# Patient Record
Sex: Female | Born: 1990 | Race: Black or African American | Hispanic: No | Smoking: Former smoker
Health system: Southern US, Community
[De-identification: ages and names within clinical notes are randomized; demographics above are authoritative.]

## PROBLEM LIST (undated history)

## (undated) DIAGNOSIS — Z789 Other specified health status: Secondary | ICD-10-CM

## (undated) DIAGNOSIS — R7303 Prediabetes: Secondary | ICD-10-CM

## (undated) DIAGNOSIS — A599 Trichomoniasis, unspecified: Secondary | ICD-10-CM

---
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
## 2010-01-24 ENCOUNTER — Emergency Department (HOSPITAL_COMMUNITY): Admission: EM | Admit: 2010-01-24 | Discharge: 2010-01-24 | Payer: Self-pay | Admitting: Family Medicine

## 2011-01-26 LAB — HCG, QUANTITATIVE, PREGNANCY: hCG, Beta Chain, Quant, S: <2

## 2011-01-26 LAB — CBC
MCHC: 34
MCV: 93.4
Platelets: 264

## 2011-04-01 ENCOUNTER — Encounter: Payer: Self-pay | Admitting: Emergency Medicine

## 2011-04-01 ENCOUNTER — Emergency Department (HOSPITAL_COMMUNITY): Payer: Self-pay

## 2011-04-01 ENCOUNTER — Emergency Department (HOSPITAL_COMMUNITY)
Admission: EM | Admit: 2011-04-01 | Discharge: 2011-04-01 | Disposition: A | Discharge status: Home / Self Care | Payer: Self-pay | Attending: Emergency Medicine | Admitting: Emergency Medicine

## 2011-04-01 DIAGNOSIS — M542 Cervicalgia: Secondary | ICD-10-CM | POA: Insufficient documentation

## 2011-04-01 DIAGNOSIS — M549 Dorsalgia, unspecified: Secondary | ICD-10-CM | POA: Insufficient documentation

## 2011-04-01 MED ORDER — IBUPROFEN 600 MG PO TABS: 600.0000 mg | ORAL_TABLET | Freq: Four times a day (QID) | ORAL | Status: AC | PRN

## 2011-04-01 MED ORDER — CYCLOBENZAPRINE HCL 10 MG PO TABS: 10.0000 mg | ORAL_TABLET | Freq: Two times a day (BID) | ORAL | Status: AC | PRN

## 2011-04-01 MED ORDER — ACETAMINOPHEN 325 MG PO TABS
650.0000 mg | ORAL_TABLET | Freq: Once | ORAL | Status: AC
  Administered 2011-04-01: 650 mg via ORAL
  Filled 2011-04-01 (×2): qty 1

## 2011-04-01 NOTE — ED Notes (Signed)
Pt denies pain at this time .  NAD, no verbal complaints
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
## 2011-06-05 ENCOUNTER — Encounter (HOSPITAL_COMMUNITY): Payer: Self-pay | Admitting: Emergency Medicine

## 2011-06-05 ENCOUNTER — Emergency Department (INDEPENDENT_AMBULATORY_CARE_PROVIDER_SITE_OTHER)
Admission: EM | Admit: 2011-06-05 | Discharge: 2011-06-05 | Disposition: A | Discharge status: Home / Self Care | Payer: Self-pay | Source: Home / Self Care | Attending: Family Medicine | Admitting: Family Medicine

## 2011-06-05 DIAGNOSIS — Z331 Pregnant state, incidental: Secondary | ICD-10-CM

## 2011-06-05 DIAGNOSIS — Z3201 Encounter for pregnancy test, result positive: Secondary | ICD-10-CM

## 2011-06-05 LAB — POCT URINALYSIS DIP (DEVICE)
Leukocytes, UA: NEGATIVE
Nitrite: NEGATIVE
Protein, ur: NEGATIVE mg/dL
pH: 8.5 — ABNORMAL HIGH (ref 5.0–8.0)

## 2011-06-05 NOTE — ED Notes (Signed)
Requested patient to put on gown and equipment at bedside

## 2011-06-05 NOTE — Discharge Instructions (Signed)
Prenatal Care  WHAT IS PRENATAL CARE?  Prenatal care means health care during your pregnancy, before your baby is born. Take care of yourself and your baby by:   Getting early prenatal care. If you know you are pregnant, or think you might be pregnant, call your caregiver as soon as possible. Schedule a visit for a general/prenatal examination.   Getting regular prenatal care. Follow your caregiver's schedule for blood and other necessary tests. Do not miss appointments.   Do everything you can to keep yourself and your baby healthy during your pregnancy.   Prenatal care should include evaluation of medical, dietary, educational, psychological, and social needs for the couple and the medical, surgical, and genetic history of the family of the mother and father.   Discuss with your caregiver:   Your medicines, prescription, over-the-counter, and herbal medicines.   Substance abuse, alcohol, smoking, and illegal drugs.   Domestic abuse and violence, if present.   Your immunizations.   Nutrition and diet.   Exercising.   Environment and occupational hazards, at home and at work.   History of sexually transmitted disease, both you and your partner.   Previous pregnancies.  WHY IS PRENATAL CARE SO IMPORTANT?  By seeing you regularly, your caregiver has the chance to find problems early, so that they can be treated as soon as possible. Other problems might be prevented. Many studies have shown that early and regular prenatal care is important for the health of both mothers and their babies.  I AM THINKING ABOUT GETTING PREGNANT. HOW CAN I TAKE CARE OF MYSELF?  Taking care of yourself before you get pregnant helps you to have a healthy pregnancy. It also lowers your chances of having a baby born with a birth defect. Here are ways to take care of yourself before you get pregnant:   Eat healthy foods, exercise regularly (30 minutes per day for most days of the week is best), and get enough  rest and sleep. Talk to your caregiver about what kinds of foods and exercises are best for you.   Take 400 micrograms (mcg) of folic acid (one of the B vitamins) every day. The best way to do this is to take a daily multivitamin pill that contains this amount of folic acid. Getting enough of the synthetic (manufactured) form of folic acid every day before you get pregnant and during early pregnancy can help prevent certain birth defects. Many breakfast cereals and other grain products have folic acid added to them, but only certain cereals contain 400 mcg of folic acid per serving. Check the label on your multivitamin or cereal to find the amount of folic acid in the food.   See your caregiver for a complete check up before getting pregnant. Make sure that you have had all your immunization shots, especially for rubella (German measles). Rubella can cause serious birth defects. Chickenpox is another illness you want to avoid during pregnancy. If you have had chickenpox and rubella in the past, you should be immune to them.   Tell your caregiver about any prescription or non-prescription medicines (including herbal remedies) you are taking. Some medicines are not safe to take during pregnancy.   Stop smoking cigarettes, drinking alcohol, or taking illegal drugs. Ask your caregiver for help, if you need it. You can also get help with alcohol and drugs by talking with a member of your faith community, a counselor, or a trusted friend.   Discuss and treat any medical, social, or psychological   problems before getting pregnant.   Discuss any history of genetic problems in the mother, father, and their families. Do genetic testing before getting pregnant, when possible.   Discuss any physical or emotional abuse with your caregiver.   Discuss with your caregiver if you might be exposed to harmful chemicals on your job or where you live.   Discuss with your caregiver if you think your job or the hours you  work may be harmful and should be changed.   The father should be involved with the decision making and with all aspects of the pregnancy, labor, and delivery.   If you have medical insurance, make sure you are covered for pregnancy.  I JUST FOUND OUT THAT I AM PREGNANT. HOW CAN I TAKE CARE OF MYSELF?  Here are ways to take care of yourself and the precious new life growing inside you:   Continue taking your multivitamin with 400 micrograms (mcg) of folic acid every day.   Get early and regular prenatal care. It does not matter if this is your first pregnancy or if you already have children. It is very important to see a caregiver during your pregnancy. Your caregiver will check at each visit to make sure that you and the baby are healthy. If there are any problems, action can be taken right away to help you and the baby.   Eat a healthy diet that includes:   Fruits.   Vegetables.   Foods low in saturated fat.   Grains.   Calcium-rich foods.   Drink 6 to 8 glasses of liquids a day.   Unless your caregiver tells you not to, try to be physically active for 30 minutes, most days of the week. If you are pressed for time, you can get your activity in through 10 minute segments, three times a day.   If you smoke, drink alcohol, or use drugs, STOP. These can cause long-term damage to your baby. Talk with your caregiver about steps to take to stop smoking. Talk with a member of your faith community, a counselor, a trusted friend, or your caregiver if you are concerned about your alcohol or drug use.   Ask your caregiver before taking any medicine, even over-the-counter medicines. Some medicines are not safe to take during pregnancy.   Get plenty of rest and sleep.   Avoid hot tubs and saunas during pregnancy.   Do not have X-rays taken, unless absolutely necessary and with the recommendation of your caregiver. A lead shield can be placed on your abdomen, to protect the baby when X-rays  are taken in other parts of the body.   Do not empty the cat litter when you are pregnant. It may contain a parasite that causes an infection called toxoplasmosis, which can cause birth defects. Also, use gloves when working in garden areas used by cats.   Do not eat uncooked or undercooked cheese, meats, or fish.   Stay away from toxic chemicals like:   Insecticides.   Solvents (some cleaners or paint thinners).   Lead.   Mercury.   Sexual relations may continue until the end of the pregnancy, unless you have a medical problem or there is a problem with the pregnancy and your caregiver tells you not to.   Do not wear high heel shoes, especially during the second half of the pregnancy. You can lose your balance and fall.   Do not take long trips, unless absolutely necessary. Be sure to see your caregiver before   going on the trip.   Do not sit in one position for more than 2 hours, when on a trip.   Take a copy of your medical records when going on a trip.   Know where there is a hospital in the city you are visiting, in case of an emergency.   Most dangerous household products will have pregnancy warnings on their labels. Ask your caregiver about products if you are unsure.   Limit or eliminate your caffeine intake from coffee, tea, sodas, medicines, and chocolate.   Many women continue working through pregnancy. Staying active might help you stay healthier. If you have a question about the safety or the hours you work at your particular job, talk with your caregiver.   Get informed:   Read books.   Watch videos.   Go to childbirth classes for you and the father.   Talk with experienced moms.   Ask your caregiver about childbirth education classes for you and your partner. Classes can help you and your partner prepare for the birth of your baby.   Ask about a pediatrician (baby doctor) and methods and pain medicine for labor, delivery, and possible Cesarean delivery  (C-section).  I AM NOT THINKING ABOUT GETTING PREGNANT RIGHT NOW, BUT HEARD THAT ALL WOMEN SHOULD TAKE FOLIC ACID EVERY DAY?  All women of childbearing age, with even a remote chance of getting pregnant, should try to make sure they get enough folic acid. Many pregnancies are not planned. Many women do not know they are actually pregnant early in their pregnancies, and certain birth defects happen in the very early part of pregnancy. Taking 400 micrograms (mcg) of folic acid every day will help prevent certain birth defects that happen in the early part of pregnancy. If a woman begins taking vitamin pills in the second or third month of pregnancy, it may be too late to prevent birth defects. Folic acid may also have other health benefits for women, besides preventing birth defects.  HOW OFTEN SHOULD I SEE MY CAREGIVER DURING PREGNANCY?  Your caregiver will give you a schedule for your prenatal visits. You will have visits more often as you get closer to the end of your pregnancy. An average pregnancy lasts about 40 weeks.  A typical schedule includes visiting your caregiver:   About once each month, during your first 6 months of pregnancy.   Every 2 weeks, during the next 2 months.   Weekly in the last month, until the delivery date.  Your caregiver will probably want to see you more often if:  You are over 35.   Your pregnancy is high risk, because you have certain health problems or problems with the pregnancy, such as:   Diabetes.   High blood pressure.   The baby is not growing on schedule, according to the dates of the pregnancy.  Your caregiver will do special tests, to make sure you and the baby are not having any serious problems. WHAT HAPPENS DURING PRENATAL VISITS?   At your first prenatal visit, your caregiver will talk to you about you and your partner's health history and your family's health history, and will do a physical exam.   On your first visit, a physical exam will  include checks of your blood pressure, height and weight, and an exam of your pelvic organs. Your caregiver will do a Pap test if you have not had one recently, and will do cultures of your cervix to make sure there is no   infection.   At each visit, there will be tests of your blood, urine, blood pressure, weight, and checking the progress of the baby.   Your caregiver will be able to tell you when to expect that your baby will be born.   Each visit is also a chance for you to learn about staying healthy during pregnancy and for asking questions.   Discuss whether you will be breastfeeding.   At your later prenatal visits, your caregiver will check how you are doing and how the baby is developing. You may have a number of tests done as your pregnancy progresses.   Ultrasound exams are often used to check on the baby's growth and health.   You may have more urine and blood tests, as well as special tests, if needed. These may include amniocentesis (examine fluid in the pregnancy sac), stress tests (check how baby responds to contractions), biophysical profile (measures fetus well-being). Your caregiver will explain the tests and why they are necessary.  I AM IN MY LATE THIRTIES, AND I WANT TO HAVE A CHILD NOW. SHOULD I DO ANYTHING SPECIAL?  As you get older, there is more chance of having a medical problem (high blood pressure), pregnancy problem (preeclampsia, problems with the placenta), miscarriage, or a baby born with a birth defect. However, most women in their late thirties and early forties have healthy babies. See your caregiver on a regular basis before you get pregnant and be sure to go for exams throughout your pregnancy. Your caregiver probably will want to do some special tests to check on you and your baby's health when you are pregnant.  Women today are often delaying having children until later in life, when they are in their thirties and forties. While many women in their thirties  and forties have no difficulty getting pregnant, fertility does decline with age. For women over 40 who cannot get pregnant after 6 months of trying, it is recommended that they see their caregiver for a fertility evaluation. It is not uncommon to have trouble becoming pregnant or experience infertility (inability to become pregnant after trying for one year). If you think that you or your partner may be infertile, you can discuss this with your caregiver. He or she can recommend treatments such as drugs, surgery, or assisted reproductive technology.  Document Released: 04/05/2003 Document Revised: 12/13/2010 Document Reviewed: 03/02/2009 [employer] Patient Information 2012 [employer]. 

## 2011-06-05 NOTE — ED Provider Notes (Signed)
Medical screening examination/treatment/procedure(s) were performed by non-physician practitioner and as supervising physician I was immediately available for consultation/collaboration.  Corrie Mckusick, MD 06/05/11 1731

## 2011-06-05 NOTE — ED Provider Notes (Signed)
History     CSN: [id number]  Arrival date & time 06/05/11  1443   None     Chief Complaint  Patient presents with  . Vaginal Bleeding    (Consider location/radiation/quality/duration/timing/severity/associated sxs/prior treatment) Patient is a 21 y.o. female presenting with vaginal bleeding. The history is provided by the patient. No language interpreter was used.  Vaginal Bleeding This is a new problem. The current episode started yesterday. The problem has not changed since onset.Pertinent negatives include no abdominal pain. The symptoms are aggravated by nothing. The symptoms are relieved by nothing. The treatment provided no relief.  Pt reports she had intercourse last pm and saw blood afterwards.  Pt reports she is pregnant.  Pt reports she had a miscarriage with last pregnancy.  No prenatal care.  Patient reports she has not had any vaginal discharge she denies any risk of any sexually transmitted. Patient has not established any prenatal care she is taking prenatal vitamins. Patient reports she saw a small amount of blood after having sex she has not had it seen any blood today patient denies any abdominal pain. She has not had any cramping  History reviewed. No pertinent past medical history.  History reviewed. No pertinent past surgical history.  No family history on file.  History  Substance Use Topics  . Smoking status: Never Smoker   . Smokeless tobacco: Not on file  . Alcohol Use: No    OB History    Grav Para Term Preterm Abortions TAB SAB Ect Mult Living                  Review of Systems  Gastrointestinal: Negative for abdominal pain.  Genitourinary: Positive for vaginal bleeding.  All other systems reviewed and are negative.    Allergies  Review of patient's allergies indicates no known allergies.  Home Medications   Current Outpatient Rx  Name Route Sig Dispense Refill  . PRENATAL VITAMINS PO Oral Take by mouth.      BP 124/62  Pulse 102   Temp(Src) 98.6 F (37 C) (Oral)  Resp 16  SpO2 100%  LMP 03/30/2011  Physical Exam  Vitals reviewed. Constitutional: She appears well-developed and well-nourished.  HENT:  Head: Normocephalic.  Eyes: Pupils are equal, round, and reactive to light.  Neck: Normal range of motion.  Pulmonary/Chest: Effort normal.  Abdominal: Soft.  Genitourinary: Vagina normal. No vaginal discharge found.       Normal vaginal exam no discharge no blood bilateral adnexa are nontender cervix nontender  Musculoskeletal: Normal range of motion.  Neurological: She is alert.  Skin: Skin is warm and dry.  Psychiatric: She has a normal mood and affect.    ED Course  Procedures (including critical care time)  Labs Reviewed  POCT URINALYSIS DIP (DEVICE) - Abnormal; Notable for the following:    Ketones, ur TRACE (*)    pH 8.5 (*)    All other components within normal limits  POCT PREGNANCY, URINE - Abnormal; Notable for the following:    Preg Test, Ur POSITIVE (*)    All other components within normal limits   No results found.   No diagnosis found.    MDM  Advised patient she does not currently have any vaginal bleeding if she is [facility]. Patient is advised to avoid intercourse no tampon use for the next week. She is advised to establish prenatal care       Langston Masker, [place] 06/05/11 1645

## 2011-06-05 NOTE — ED Notes (Signed)
Started vaginal spotting last night.  Brownish discharge with a little blood after intercourse last night and noticed with wiping.  Denies pain, denies spotting today.  Patient did not have transportation when symptoms occurred

## 2011-06-23 ENCOUNTER — Inpatient Hospital Stay (HOSPITAL_COMMUNITY)
Admission: AD | Admit: 2011-06-23 | Discharge: 2011-06-25 | DRG: 777 | Disposition: A | Discharge status: Home / Self Care | Payer: Medicaid Other | Source: Ambulatory Visit | Attending: Obstetrics & Gynecology | Admitting: Obstetrics & Gynecology

## 2011-06-23 ENCOUNTER — Inpatient Hospital Stay (HOSPITAL_COMMUNITY): Payer: Medicaid Other | Admitting: Anesthesiology

## 2011-06-23 ENCOUNTER — Inpatient Hospital Stay (HOSPITAL_COMMUNITY): Payer: Medicaid Other

## 2011-06-23 ENCOUNTER — Encounter (HOSPITAL_COMMUNITY)
Admission: AD | Disposition: A | Discharge status: Home / Self Care | Payer: Self-pay | Source: Ambulatory Visit | Attending: Obstetrics & Gynecology

## 2011-06-23 ENCOUNTER — Encounter (HOSPITAL_COMMUNITY): Payer: Self-pay | Admitting: Anesthesiology

## 2011-06-23 ENCOUNTER — Encounter (HOSPITAL_COMMUNITY): Payer: Self-pay | Admitting: *Deleted

## 2011-06-23 ENCOUNTER — Encounter (HOSPITAL_COMMUNITY): Payer: Self-pay | Admitting: Obstetrics & Gynecology

## 2011-06-23 DIAGNOSIS — A499 Bacterial infection, unspecified: Secondary | ICD-10-CM | POA: Diagnosis present

## 2011-06-23 DIAGNOSIS — Z5331 Laparoscopic surgical procedure converted to open procedure: Secondary | ICD-10-CM

## 2011-06-23 DIAGNOSIS — O00109 Unspecified tubal pregnancy without intrauterine pregnancy: Principal | ICD-10-CM | POA: Diagnosis present

## 2011-06-23 DIAGNOSIS — O008 Other ectopic pregnancy without intrauterine pregnancy: Secondary | ICD-10-CM | POA: Insufficient documentation

## 2011-06-23 DIAGNOSIS — N760 Acute vaginitis: Secondary | ICD-10-CM | POA: Diagnosis present

## 2011-06-23 DIAGNOSIS — B9689 Other specified bacterial agents as the cause of diseases classified elsewhere: Secondary | ICD-10-CM | POA: Diagnosis present

## 2011-06-23 HISTORY — DX: Trichomoniasis, unspecified: A59.9

## 2011-06-23 HISTORY — PX: LAPAROTOMY: SHX154

## 2011-06-23 HISTORY — PX: LAPAROSCOPY: SHX197

## 2011-06-23 LAB — CBC
HCT: 39.5 % (ref 36.0–46.0)
MCH: 31.4 pg (ref 26.0–34.0)
MCHC: 33.2 g/dL (ref 30.0–36.0)
MCV: 94.7 fL (ref 78.0–100.0)
RDW: 12.8 % (ref 11.5–15.5)

## 2011-06-23 LAB — URINE MICROSCOPIC-ADD ON

## 2011-06-23 LAB — URINALYSIS, ROUTINE W REFLEX MICROSCOPIC
Bilirubin Urine: NEGATIVE
Nitrite: NEGATIVE
Protein, ur: NEGATIVE mg/dL
Specific Gravity, Urine: 1.025 (ref 1.005–1.030)
Urobilinogen, UA: 0.2 mg/dL (ref 0.0–1.0)

## 2011-06-23 LAB — HCG, QUANTITATIVE, PREGNANCY: hCG, Beta Chain, Quant, S: 4463 m[IU]/mL — ABNORMAL HIGH (ref <5)

## 2011-06-23 LAB — WET PREP, GENITAL

## 2011-06-23 SURGERY — LAPAROSCOPY OPERATIVE
Anesthesia: General | Site: Abdomen | Laterality: Left

## 2011-06-23 MED ORDER — GLYCOPYRROLATE 0.2 MG/ML IJ SOLN
INTRAMUSCULAR | Status: DC | PRN
  Administered 2011-06-23: .6 mg via INTRAVENOUS

## 2011-06-23 MED ORDER — CITRIC ACID-SODIUM CITRATE 334-500 MG/5ML PO SOLN
30.0000 mL | Freq: Once | ORAL | Status: AC
  Administered 2011-06-23: 30 mL via ORAL
  Filled 2011-06-23: qty 15

## 2011-06-23 MED ORDER — BUPIVACAINE HCL (PF) 0.25 % IJ SOLN
INTRAMUSCULAR | Status: AC
  Filled 2011-06-23: qty 30

## 2011-06-23 MED ORDER — MENTHOL 3 MG MT LOZG: 1.0000 | LOZENGE | OROMUCOSAL | Status: DC | PRN

## 2011-06-23 MED ORDER — CEFAZOLIN SODIUM 1-5 GM-% IV SOLN
INTRAVENOUS | Status: DC | PRN
  Administered 2011-06-23: 1 g via INTRAVENOUS

## 2011-06-23 MED ORDER — SUCCINYLCHOLINE CHLORIDE 20 MG/ML IJ SOLN
INTRAMUSCULAR | Status: DC | PRN
  Administered 2011-06-23: 140 mg via INTRAVENOUS

## 2011-06-23 MED ORDER — MIDAZOLAM HCL 2 MG/2ML IJ SOLN
INTRAMUSCULAR | Status: AC
  Filled 2011-06-23: qty 2

## 2011-06-23 MED ORDER — MIDAZOLAM HCL 5 MG/5ML IJ SOLN
INTRAMUSCULAR | Status: DC | PRN
  Administered 2011-06-23: 2 mg via INTRAVENOUS

## 2011-06-23 MED ORDER — OXYCODONE-ACETAMINOPHEN 5-325 MG PO TABS
1.0000 | ORAL_TABLET | ORAL | Status: DC | PRN
  Administered 2011-06-24 – 2011-06-25 (×5): 2 via ORAL
  Filled 2011-06-23 (×5): qty 2

## 2011-06-23 MED ORDER — ONDANSETRON HCL 4 MG PO TABS: 4.0000 mg | ORAL_TABLET | Freq: Four times a day (QID) | ORAL | Status: DC | PRN

## 2011-06-23 MED ORDER — PANTOPRAZOLE SODIUM 40 MG PO TBEC
40.0000 mg | DELAYED_RELEASE_TABLET | Freq: Every day | ORAL | Status: DC
  Administered 2011-06-24: 40 mg via ORAL
  Filled 2011-06-23 (×2): qty 1

## 2011-06-23 MED ORDER — FENTANYL CITRATE 0.05 MG/ML IJ SOLN
INTRAMUSCULAR | Status: AC
  Filled 2011-06-23: qty 5

## 2011-06-23 MED ORDER — NEOSTIGMINE METHYLSULFATE 1 MG/ML IJ SOLN
INTRAMUSCULAR | Status: DC | PRN
  Administered 2011-06-23: 3 mg via INTRAVENOUS

## 2011-06-23 MED ORDER — GLYCOPYRROLATE 0.2 MG/ML IJ SOLN
INTRAMUSCULAR | Status: AC
  Filled 2011-06-23: qty 1

## 2011-06-23 MED ORDER — VASOPRESSIN 20 UNIT/ML IJ SOLN
INTRAVENOUS | Status: DC | PRN
  Administered 2011-06-23: 18:00:00 via INTRAMUSCULAR

## 2011-06-23 MED ORDER — ROCURONIUM BROMIDE 50 MG/5ML IV SOLN
INTRAVENOUS | Status: AC
  Filled 2011-06-23: qty 1

## 2011-06-23 MED ORDER — ONDANSETRON HCL 4 MG/2ML IJ SOLN: 4.0000 mg | Freq: Four times a day (QID) | INTRAMUSCULAR | Status: DC | PRN

## 2011-06-23 MED ORDER — VASOPRESSIN 20 UNIT/ML IJ SOLN
INTRAMUSCULAR | Status: AC
  Filled 2011-06-23: qty 1

## 2011-06-23 MED ORDER — LACTATED RINGERS IV BOLUS (SEPSIS)
250.0000 mL | Freq: Once | INTRAVENOUS | Status: AC
  Administered 2011-06-23: 1000 mL via INTRAVENOUS

## 2011-06-23 MED ORDER — SUCCINYLCHOLINE CHLORIDE 20 MG/ML IJ SOLN
INTRAMUSCULAR | Status: AC
  Filled 2011-06-23: qty 10

## 2011-06-23 MED ORDER — PHENYLEPHRINE HCL 10 MG/ML IJ SOLN
INTRAMUSCULAR | Status: DC | PRN
  Administered 2011-06-23: .8 mg via INTRAVENOUS
  Administered 2011-06-23: .12 mg via INTRAVENOUS
  Administered 2011-06-23: .08 mg via INTRAVENOUS

## 2011-06-23 MED ORDER — IBUPROFEN 600 MG PO TABS: 600.0000 mg | ORAL_TABLET | Freq: Four times a day (QID) | ORAL | Status: DC | PRN

## 2011-06-23 MED ORDER — FENTANYL CITRATE 0.05 MG/ML IJ SOLN
INTRAMUSCULAR | Status: DC | PRN
  Administered 2011-06-23 (×3): 50 ug via INTRAVENOUS
  Administered 2011-06-23: 150 ug via INTRAVENOUS
  Administered 2011-06-23 (×3): 50 ug via INTRAVENOUS

## 2011-06-23 MED ORDER — ONDANSETRON HCL 4 MG/2ML IJ SOLN
INTRAMUSCULAR | Status: DC | PRN
  Administered 2011-06-23: 4 mg via INTRAVENOUS

## 2011-06-23 MED ORDER — LIDOCAINE HCL (CARDIAC) 20 MG/ML IV SOLN
INTRAVENOUS | Status: AC
  Filled 2011-06-23: qty 5

## 2011-06-23 MED ORDER — BUPIVACAINE HCL (PF) 0.25 % IJ SOLN
INTRAMUSCULAR | Status: DC | PRN
  Administered 2011-06-23: 1 mL

## 2011-06-23 MED ORDER — ONDANSETRON HCL 4 MG/2ML IJ SOLN
INTRAMUSCULAR | Status: AC
  Filled 2011-06-23: qty 2

## 2011-06-23 MED ORDER — FENTANYL CITRATE 0.05 MG/ML IJ SOLN
INTRAMUSCULAR | Status: AC
  Filled 2011-06-23: qty 2

## 2011-06-23 MED ORDER — FAMOTIDINE IN NACL 20-0.9 MG/50ML-% IV SOLN
20.0000 mg | Freq: Once | INTRAVENOUS | Status: AC
  Administered 2011-06-23: 20 mg via INTRAVENOUS
  Filled 2011-06-23: qty 50

## 2011-06-23 MED ORDER — CEFAZOLIN SODIUM 1-5 GM-% IV SOLN
INTRAVENOUS | Status: AC
  Filled 2011-06-23: qty 50

## 2011-06-23 MED ORDER — SIMETHICONE 80 MG PO CHEW: 80.0000 mg | CHEWABLE_TABLET | Freq: Four times a day (QID) | ORAL | Status: DC | PRN

## 2011-06-23 MED ORDER — DEXAMETHASONE SODIUM PHOSPHATE 4 MG/ML IJ SOLN
INTRAMUSCULAR | Status: DC | PRN
  Administered 2011-06-23: 10 mg via INTRAVENOUS

## 2011-06-23 MED ORDER — DOCUSATE SODIUM 100 MG PO CAPS
100.0000 mg | ORAL_CAPSULE | Freq: Two times a day (BID) | ORAL | Status: DC
  Administered 2011-06-23 – 2011-06-24 (×3): 100 mg via ORAL
  Filled 2011-06-23 (×3): qty 1

## 2011-06-23 MED ORDER — DEXAMETHASONE SODIUM PHOSPHATE 10 MG/ML IJ SOLN
INTRAMUSCULAR | Status: AC
  Filled 2011-06-23: qty 1

## 2011-06-23 MED ORDER — LIDOCAINE HCL (CARDIAC) 20 MG/ML IV SOLN
INTRAVENOUS | Status: DC | PRN
  Administered 2011-06-23: 20 mg via INTRAVENOUS

## 2011-06-23 MED ORDER — ZOLPIDEM TARTRATE 5 MG PO TABS: 5.0000 mg | ORAL_TABLET | Freq: Every evening | ORAL | Status: DC | PRN

## 2011-06-23 MED ORDER — HYDROMORPHONE HCL PF 1 MG/ML IJ SOLN
0.2000 mg | INTRAMUSCULAR | Status: DC | PRN
  Administered 2011-06-23: 0.6 mg via INTRAVENOUS
  Filled 2011-06-23 (×2): qty 1

## 2011-06-23 MED ORDER — PROPOFOL 10 MG/ML IV EMUL
INTRAVENOUS | Status: DC | PRN
  Administered 2011-06-23: 200 mg via INTRAVENOUS

## 2011-06-23 MED ORDER — PROPOFOL 10 MG/ML IV EMUL
INTRAVENOUS | Status: AC
  Filled 2011-06-23: qty 20

## 2011-06-23 MED ORDER — LACTATED RINGERS IV SOLN
INTRAVENOUS | Status: DC
  Administered 2011-06-23: 1000 mL via INTRAVENOUS

## 2011-06-23 MED ORDER — ROCURONIUM BROMIDE 100 MG/10ML IV SOLN
INTRAVENOUS | Status: DC | PRN
  Administered 2011-06-23: 2.5 mg via INTRAVENOUS
  Administered 2011-06-23: 30 mg via INTRAVENOUS
  Administered 2011-06-23: 10 mg via INTRAVENOUS

## 2011-06-23 MED ORDER — FENTANYL CITRATE 0.05 MG/ML IJ SOLN
25.0000 ug | INTRAMUSCULAR | Status: DC | PRN
  Administered 2011-06-23 (×4): 50 ug via INTRAVENOUS

## 2011-06-23 MED ORDER — NEOSTIGMINE METHYLSULFATE 1 MG/ML IJ SOLN
INTRAMUSCULAR | Status: AC
  Filled 2011-06-23: qty 10

## 2011-06-23 MED ORDER — LACTATED RINGERS IV SOLN
INTRAVENOUS | Status: DC | PRN
  Administered 2011-06-23 (×3): via INTRAVENOUS

## 2011-06-23 SURGICAL SUPPLY — 45 items
BLADE SURG 10 STRL SS (BLADE) ×2 IMPLANT
CABLE HIGH FREQUENCY MONO STRZ (ELECTRODE) IMPLANT
CHLORAPREP W/TINT 26ML (MISCELLANEOUS) ×2 IMPLANT
CLOTH BEACON ORANGE TIMEOUT ST (SAFETY) ×2 IMPLANT
DRSG COVADERM 4X10 (GAUZE/BANDAGES/DRESSINGS) ×2 IMPLANT
DRSG COVADERM 4X8 (GAUZE/BANDAGES/DRESSINGS) ×2 IMPLANT
DRSG COVADERM PLUS 2X2 (GAUZE/BANDAGES/DRESSINGS) ×2 IMPLANT
ELECT REM PT RETURN 9FT ADLT (ELECTROSURGICAL) ×2
ELECTRODE REM PT RTRN 9FT ADLT (ELECTROSURGICAL) ×1 IMPLANT
GLOVE BIO SURGEON STRL SZ7 (GLOVE) ×4 IMPLANT
GLOVE BIOGEL PI IND STRL 7.0 (GLOVE) ×2 IMPLANT
GLOVE BIOGEL PI INDICATOR 7.0 (GLOVE) ×2
GLOVE SURG SS PI 7.5 STRL IVOR (GLOVE) ×4 IMPLANT
GOWN PREVENTION PLUS LG XLONG (DISPOSABLE) ×4 IMPLANT
HEMOSTAT SURGICEL 2X3 (HEMOSTASIS) ×2 IMPLANT
NEEDLE GYRUS 33CM (NEEDLE) ×2 IMPLANT
NEEDLE INSUFFLATION 14GA 120MM (NEEDLE) ×4 IMPLANT
NS IRRIG 1000ML POUR BTL (IV SOLUTION) ×2 IMPLANT
PACK LAPAROSCOPY BASIN (CUSTOM PROCEDURE TRAY) ×2 IMPLANT
PENCIL BUTTON HOLSTER BLD 10FT (ELECTRODE) ×2 IMPLANT
POUCH SPECIMEN RETRIEVAL 10MM (ENDOMECHANICALS) ×2 IMPLANT
PROTECTOR NERVE ULNAR (MISCELLANEOUS) ×2 IMPLANT
SEALER TISSUE G2 CVD JAW 35 (ENDOMECHANICALS) IMPLANT
SEALER TISSUE G2 CVD JAW 45CM (ENDOMECHANICALS) IMPLANT
SET IRRIG TUBING LAPAROSCOPIC (IRRIGATION / IRRIGATOR) ×2 IMPLANT
SPONGE LAP 18X18 X RAY DECT (DISPOSABLE) ×8 IMPLANT
SUT VIC AB 0 CT1 18XCR BRD8 (SUTURE) ×1 IMPLANT
SUT VIC AB 0 CT1 27 (SUTURE) ×1
SUT VIC AB 0 CT1 27XBRD ANBCTR (SUTURE) ×1 IMPLANT
SUT VIC AB 0 CT1 8-18 (SUTURE) ×1
SUT VIC AB 2-0 SH 27 (SUTURE) ×3
SUT VIC AB 2-0 SH 27XBRD (SUTURE) ×3 IMPLANT
SUT VIC AB 3-0 X1 27 (SUTURE) IMPLANT
SUT VIC AB 4-0 KS 27 (SUTURE) ×2 IMPLANT
SUT VICRYL 0 UR6 27IN ABS (SUTURE) ×4 IMPLANT
SUT VICRYL 4-0 PS2 18IN ABS (SUTURE) ×2 IMPLANT
SYR 50ML LL SCALE MARK (SYRINGE) ×2 IMPLANT
TOWEL OR 17X24 6PK STRL BLUE (TOWEL DISPOSABLE) ×6 IMPLANT
TRAY FOLEY CATH 14FR (SET/KITS/TRAYS/PACK) ×2 IMPLANT
TROCAR Z-THREAD BLADED 11X100M (TROCAR) ×2 IMPLANT
TROCAR Z-THREAD BLADED 5X100MM (TROCAR) ×2 IMPLANT
TUBING NON-CON 1/4 X 20 CONN (TUBING) ×2 IMPLANT
WARMER LAPAROSCOPE (MISCELLANEOUS) ×2 IMPLANT
WATER STERILE IRR 1000ML POUR (IV SOLUTION) IMPLANT
YANKAUER SUCT BULB TIP NO VENT (SUCTIONS) ×2 IMPLANT

## 2011-06-23 NOTE — Op Note (Signed)
Brittany Quinn PROCEDURE DATE: 06/23/2011  PREOPERATIVE DIAGNOSIS: Left ectopic pregnancy at [redacted] weeks GA, nonviable fetus seen on ultrasound POSTOPERATIVE DIAGNOSIS: Ruptured left cornual ectopic pregnancy PROCEDURE: Diagnostic laparoscopy converted to exploratory laparotomy, left salpingectomy, left cornual wedge resection and removal of ectopic pregnancy SURGEON:  Dr. Jaynie Collins  INDICATIONS: 21 y.o. G2P0010 here for with diagnosed left ectopic pregnancy. On ultrasound, there was also possibility of a cornual ectopic. Given these ultrasound findings, patient was counseled regarding the need in surgery. Laparoscopic removal of the left fallopian tube, possible laparotomy was recommended. Patient was told that in the event of a cornual ectopic, a wedge resection will need to be done.Risks of surgery were discussed with the patient including but not limited to: bleeding which may require transfusion or reoperation; infection which may require antibiotics; injury to bowel, bladder, ureters or other surrounding organs; need for additional procedures; thromboembolic phenomenon, incisional problems and other postoperative/anesthesia complications. Written informed consent was obtained.    FINDINGS:  Ruptured left cornual ectopic pregnancy, with 300 mL of hemoperitoneum. Normal left ovary, right ovary, right fallopian tube and rest of the uterus. No other source of bleeding noted.  ANESTHESIA:    General INTRAVENOUS FLUIDS: 2600  mL ESTIMATED BLOOD LOSS: 500 ml (300 mL hemoperitoneum, 200 mL blood loss during surgery) URINE OUTPUT: 150 ml SPECIMENS: Left cornual ectopic pregnancy COMPLICATIONS: None immediate  PROCEDURE IN DETAIL:  The patient received intravenous antibiotics and had sequential compression devices applied to her lower extremities.  She was then taken to the operating room where general anesthesia was administered and was found to be adequate.  She was placed in the dorsal lithotomy  position, and was prepped and draped in a sterile manner.  A Foley catheter was inserted into her bladder and attached to constant drainage. After an adequate timeout was performed, attention was turned to her abdomen where an umbilical incision was made. The Veress needle was then introduced into the abdomen, and the abdomen was insufflated with carbon dioxide gas. The 10 mm umbilical trocar was then placed, and the laparoscope was placed through this port. The abdomen and pelvis were surveyed, and the findings were as noted above. The decision was made to convert from laparoscopy to exploratory laparotomy. The trocar was removed from the patient's umbilicus. The abdomen was desufflated, and the incision was reapproximated with 4-0 Vicryl.  Attention was then turned to her lower abdomen where a Pfannenstiel incision was made. This was taken down to the layer of fascia, the fascia was incised in the midline and then incision was extended bilaterally. Cokers were applied to the superior aspect of the fascial incision and the underlying rectus muscles were dissected up bluntly and sharply. A similar process was carried out on the inferior aspect. The muscles were separated in the midline bluntly, and the peritoneum was entered bluntly. The hemoperitoneum was noted immediately and was suctioned off. Attention was then turned to the patient's uterus, where there ruptured left cornual ectopic was noted with significant ongoing blood loss. The utero-ovarian pedicle was then clamped with Heaney clamps, cut and doubly suture ligated. The rest of the fallopian tube on the left side was also clamped in the mesosalpinx area, cut and suture ligated allowing for left salpingectomy. The base of the corneal ectopic was infiltrated with a diluted vasopressin solution, and clamps were placed underneath the cornual ectopic. The cornual area containing the ectopic was then resected from the side of the uterus.  The incision was  reapproximated using 0  Vicryl interrupted sutures, then imbricated with 2-0 Vicryl. 2-0 Vicryl was also used in figure-of-eight stitches to help with hemostasis. Surgicel was then placed on the incision. The rest of the pelvis were surveyed and no other site of bleeding was noted. The pelvis was irrigated, and hemostasis was reconfirmed.  The muscles were then reapproximated with interrupted 2-0 Vicryl sutures.  The fascia was reapproximated using 0 Vicryl in a running stitch, and the skin was reapproximated using a 4-0 Vicryl subcuticular stitch. The patient tolerated the procedure well. Sponge, instruments, and needle counts were correct x 2.  She was taken to the recovery room in stable condition.

## 2011-06-23 NOTE — ED Provider Notes (Signed)
History   Brittany Quinn is a 21 y.o. year old G40P0010 female at 12.[redacted] weeks gestation by LMP who presents to [facility] reporting spotting. She has been seen once before in the is pregnancy for spotting at [facility] and did not follow-up.    CSN: [id number]  Arrival date & time 06/23/11  1238   None     Chief Complaint  Patient presents with  . Vaginal Bleeding    (Consider location/radiation/quality/duration/timing/severity/associated sxs/prior treatment) HPI  Past Medical History  Diagnosis Date  . Trichomonas     Past Surgical History  Procedure Date  . No past surgeries     History reviewed. No pertinent family history.  History  Substance Use Topics  . Smoking status: Never Smoker   . Smokeless tobacco: Never Used  . Alcohol Use: No    OB History    Grav Para Term Preterm Abortions TAB SAB Ect Mult Living   2    1  1          Review of Systems  Constitutional: Negative for fever and chills.  Gastrointestinal: Negative for nausea, vomiting, abdominal pain, diarrhea, constipation and abdominal distention.  Genitourinary: Positive for vaginal bleeding.  Neurological: Negative for syncope and weakness.    Allergies  Review of patient's allergies indicates no known allergies.  Home Medications  No current outpatient prescriptions on file.  BP 137/77  Pulse 102  Temp(Src) 99.2 F (37.3 C) (Oral)  Resp 20  Ht 5\' 3"  (1.6 m)  Wt 78.926 kg (174 lb)  BMI 30.82 kg/m2  LMP 03/30/2011  Physical Exam  Constitutional: She is oriented to person, place, and time. She appears well-developed and well-nourished.  Cardiovascular: Regular rhythm.  Tachycardia present.   Pulmonary/Chest: Effort normal and breath sounds normal.  Abdominal: Soft. There is no tenderness.  Genitourinary: Uterus is not enlarged and not tender. Cervix exhibits no motion tenderness, no discharge and no friability. Right adnexum displays no mass, no tenderness and no fullness. Left adnexum  displays no mass, no tenderness and no fullness. There is bleeding (small-moderate amount of BRB) around the vagina.  Musculoskeletal: Normal range of motion.  Neurological: She is alert and oriented to person, place, and time.  Skin: Skin is warm and dry. No pallor.  Psychiatric: She has a normal mood and affect.    ED Course  Procedures (including critical care time)  Results for orders placed during the hospital encounter of 06/23/11 (from the past 24 hour(s))  WET PREP, GENITAL     Status: Abnormal   Collection Time   06/23/11  1:30 PM      Component Value Range   Yeast Wet Prep HPF POC NONE SEEN  NONE SEEN    Trich, Wet Prep NONE SEEN  NONE SEEN    Clue Cells Wet Prep HPF POC MODERATE (*) NONE SEEN    WBC, Wet Prep HPF POC FEW (*) NONE SEEN   HCG, QUANTITATIVE, PREGNANCY     Status: Abnormal   Collection Time   06/23/11  1:44 PM      Component Value Range   hCG, Beta Chain, Quant, S 4463 (*) <5 (mIU/mL)  CBC     Status: Normal   Collection Time   06/23/11  1:44 PM      Component Value Range   WBC 7.7  4.0 - 10.5 (K/uL)   RBC 4.17  3.87 - 5.11 (MIL/uL)   Hemoglobin 13.1  12.0 - 15.0 (g/dL)   HCT 14.7  82.9 -  46.0 (%)   MCV 94.7  78.0 - 100.0 (fL)   MCH 31.4  26.0 - 34.0 (pg)   MCHC 33.2  30.0 - 36.0 (g/dL)   RDW 25.3  66.4 - 40.3 (%)   Platelets 233  150 - 400 (K/uL)  URINALYSIS, ROUTINE W REFLEX MICROSCOPIC     Status: Abnormal   Collection Time   06/23/11  2:10 PM      Component Value Range   Color, Urine YELLOW  YELLOW    APPearance CLEAR  CLEAR    Specific Gravity, Urine 1.025  1.005 - 1.030    pH 6.0  5.0 - 8.0    Glucose, UA NEGATIVE  NEGATIVE (mg/dL)   Hgb urine dipstick LARGE (*) NEGATIVE    Bilirubin Urine NEGATIVE  NEGATIVE    Ketones, ur NEGATIVE  NEGATIVE (mg/dL)   Protein, ur NEGATIVE  NEGATIVE (mg/dL)   Urobilinogen, UA 0.2  0.0 - 1.0 (mg/dL)   Nitrite NEGATIVE  NEGATIVE    Leukocytes, UA NEGATIVE  NEGATIVE   URINE MICROSCOPIC-ADD ON     Status: Abnormal    Collection Time   06/23/11  2:10 PM      Component Value Range   Squamous Epithelial / LPF FEW (*) RARE    RBC / HPF 3-6  <3 (RBC/hpf)   Blood Type B pos   US Ob Comp Less 14 Wks  06/23/2011  *RADIOLOGY REPORT*  Clinical Data: 21 year old pregnant female with bleeding and spotting.  OBSTETRIC <14 WK [place] AND TRANSVAGINAL OB US  Technique:  Both transabdominal and transvaginal ultrasound examinations were performed for complete evaluation of the gestation as well as the maternal uterus, adnexal regions, and pelvic cul-de-sac.  Transvaginal technique was performed to assess early pregnancy.  Comparison:  None  Intrauterine gestational sac:  Not visualized  A 5.6 x 4.3 x 5.7 cm left periuterine adnexal mass is identified with a fetal pole. Crown rump length measures 12.3 mm which would correspond to a gestational age of [redacted] weeks 4 days.  There is no evidence of fetal cardiac activity. It is difficult to determine the location of this ectopic pregnancy but an interstitial/cornual ectopic pregnancy is not excluded.  The ovaries bilaterally are unremarkable. A trace amount of free fluid within the pelvis is noted.  IMPRESSION: Left periuterine ectopic pregnancy with fetal pole.  It is difficult to determine if this ectopic pregnancy lies in a cornual versus isthmic location.  Critical Value/emergent results were called by telephone at the time of interpretation on 06/23/2011  at 03:22 p.m.  to  [place], midwife, who verbally acknowledged these results.  Original Report Authenticated By: Rosendo Gros, M.D.     MDM  Left ectopic pregnancy Prep for OR Dr. Macon Large at [facility] discussing R/B/I for surgery  Support given  Dorathy Kinsman 06/23/2011 3:58 PM

## 2011-06-23 NOTE — Preoperative (Signed)
Beta Blockers   Reason not to administer Beta Blockers:Not Applicable 

## 2011-06-23 NOTE — Transfer of Care (Signed)
Immediate Anesthesia Transfer of Care Note  Patient: Brittany Quinn  Procedure(s) Performed: Procedure(s) (LRB): LAPAROSCOPY OPERATIVE (Left) LAPAROTOMY (Left)  Patient Location: PACU  Anesthesia Type: General  Level of Consciousness: awake, alert , oriented and sedated  Airway & Oxygen Therapy: Patient Spontanous Breathing and Patient connected to nasal cannula oxygen  Post-op Assessment: Report given to PACU RN and Post -op Vital signs reviewed and stable  Post vital signs: stable  Complications: No apparent anesthesia complications

## 2011-06-23 NOTE — H&P (Signed)
Preoperative History and Physical  Brittany Quinn is a 21 y.o. G2P0010 here for surgical management of 7 week size ectopic pregnancy seen on ultrasound.  Patient came in for evaluation of bleeding in early pregnancy, no PNC yet.  Denies any abdominal pain or other symptoms.   Proposed surgery: Laparoscopic salpingectomy and removal of ectopic pregnancy, possible laparotomy  PMH:    Past Medical History  Diagnosis Date  . Trichomonas    PSH:     Past Surgical History  Procedure Date  . No past surgeries    POb/GynH:   OB History    Grav Para Term Preterm Abortions TAB SAB Ect Mult Living   2    1  1        Patient denies any cervical dysplasia or other STIs apart from Trichomonas.  Medications: Current facility-administered medications:citric acid-sodium citrate (ORACIT) solution 30 mL, 30 mL, Oral, Once, Jiles Garter, MD;  famotidine (PEPCID) IVPB 20 mg, 20 mg, Intravenous, Once, Jiles Garter, MD;  lactated ringers bolus 250 mL, 250 mL, Intravenous, Once, Tereso Newcomer, MD, 1,000 mL at 06/23/11 1616  Allergies: No Known Allergies  SH:   History  Substance Use Topics  . Smoking status: Never Smoker   . Smokeless tobacco: Never Used  . Alcohol Use: No   FH:   History reviewed. No pertinent family history.  Review of Systems: Noncontributory  PHYSICAL EXAM: Blood pressure 137/77, pulse 102, temperature 99.2 F (37.3 C), temperature source Oral, resp. rate 20, height 5\' 3"  (1.6 m), weight 78.926 kg (174 lb), last menstrual period 03/30/2011. General appearance - alert, well appearing, and in no distress Chest - clear to auscultation, no wheezes, rales or rhonchi, symmetric air entry Heart - normal rate and regular rhythm Abdomen - soft, nontender, nondistended, no masses or organomegaly Pelvic - examination not indicated Ext - No c/c/e, NT  Labs: Recent Results (from the past 336 hour(s))  WET PREP, GENITAL   Collection Time   06/23/11  1:30 PM   Component Value Range   Yeast Wet Prep HPF POC NONE SEEN  NONE SEEN    Trich, Wet Prep NONE SEEN  NONE SEEN    Clue Cells Wet Prep HPF POC MODERATE (*) NONE SEEN    WBC, Wet Prep HPF POC FEW (*) NONE SEEN   HCG, QUANTITATIVE, PREGNANCY   Collection Time   06/23/11  1:44 PM      Component Value Range   hCG, Beta Chain, Quant, S 4463 (*) <5 (mIU/mL)  CBC   Collection Time   06/23/11  1:44 PM      Component Value Range   WBC 7.7  4.0 - 10.5 (K/uL)   RBC 4.17  3.87 - 5.11 (MIL/uL)   Hemoglobin 13.1  12.0 - 15.0 (g/dL)   HCT 16.1  09.6 - 04.5 (%)   MCV 94.7  78.0 - 100.0 (fL)   MCH 31.4  26.0 - 34.0 (pg)   MCHC 33.2  30.0 - 36.0 (g/dL)   RDW 40.9  81.1 - 91.4 (%)   Platelets 233  150 - 400 (K/uL)  URINALYSIS, ROUTINE W REFLEX MICROSCOPIC   Collection Time   06/23/11  2:10 PM      Component Value Range   Color, Urine YELLOW  YELLOW    APPearance CLEAR  CLEAR    Specific Gravity, Urine 1.025  1.005 - 1.030    pH 6.0  5.0 - 8.0    Glucose, UA NEGATIVE  NEGATIVE (mg/dL)  Hgb urine dipstick LARGE (*) NEGATIVE    Bilirubin Urine NEGATIVE  NEGATIVE    Ketones, ur NEGATIVE  NEGATIVE (mg/dL)   Protein, ur NEGATIVE  NEGATIVE (mg/dL)   Urobilinogen, UA 0.2  0.0 - 1.0 (mg/dL)   Nitrite NEGATIVE  NEGATIVE    Leukocytes, UA NEGATIVE  NEGATIVE   URINE MICROSCOPIC-ADD ON   Collection Time   06/23/11  2:10 PM      Component Value Range   Squamous Epithelial / LPF FEW (*) RARE    RBC / HPF 3-6  <3 (RBC/hpf)   Imaging Studies: 06/23/2011  OBSTETRIC <14 WK [place] AND TRANSVAGINAL OB US  Technique:  Both transabdominal and transvaginal ultrasound examinations were performed for complete evaluation of the gestation as well as the maternal uterus, adnexal regions, and pelvic cul-de-sac.  Transvaginal technique was performed to assess early pregnancy.  Comparison:  None  Intrauterine gestational sac:  Not visualized  A 5.6 x 4.3 x 5.7 cm left periuterine adnexal mass is identified with a fetal pole. Crown rump  length measures 12.3 mm which would correspond to a gestational age of [redacted] weeks 4 days.  There is no evidence of fetal cardiac activity. It is difficult to determine the location of this ectopic pregnancy but an interstitial/cornual ectopic pregnancy is not excluded.  The ovaries bilaterally are unremarkable. A trace amount of free fluid within the pelvis is noted.  IMPRESSION: Left periuterine ectopic pregnancy with fetal pole.  It is difficult to determine if this ectopic pregnancy lies in a cornual versus isthmic location.  Critical Value/emergent results were called by telephone at the time of interpretation on 06/23/2011  at 03:22 p.m.  to  [place], midwife, who verbally acknowledged these results.  Original Report Authenticated By: Rosendo Gros, M.D.   Assessment and Plan: 21 y.o. G2P0010 with 7 week size left ectopic pregnancy, On exam, she had stable vital signs, and no peritoneal signs .B+, Hgb 13.3. Patient was counseled regarding need for laparoscopic salpingectomy, possible laparotomy especially in the event of a cornual ectopic. Risks of surgery including bleeding which may require transfusion or reoperation, infection, injury to bowel or other surrounding organs, need for additional procedures including laparotomy were explained to patient and written informed consent was obtained. Patient has been NPO since 2230 on 06/22/11 and she will remain NPO for procedure. Anesthesia and OR aware. To OR when ready.   Jaynie Collins, M.D. 06/23/2011 4:23 PM

## 2011-06-23 NOTE — Progress Notes (Signed)
Spotting intermittently since last night, when wiping.

## 2011-06-23 NOTE — ED Provider Notes (Signed)
Attestation of Attending Supervision of Advanced Practitioner: Evaluation and management procedures were performed by the PA/NP/CNM/OB Fellow under my supervision/collaboration. Chart reviewed, and agree with management and plan.  21 y.o. G2P0010 with 7 week size left ectopic pregnancy, see ultrasound report below.     06/23/2011   OBSTETRIC <14 WK [place] AND TRANSVAGINAL OB US  Technique:  Both transabdominal and transvaginal ultrasound examinations were performed for complete evaluation of the gestation as well as the maternal uterus, adnexal regions, and pelvic cul-de-sac.  Transvaginal technique was performed to assess early pregnancy.  Comparison:  None  Intrauterine gestational sac:  Not visualized  A 5.6 x 4.3 x 5.7 cm left periuterine adnexal mass is identified with a fetal pole. Crown rump length measures 12.3 mm which would correspond to a gestational age of [redacted] weeks 4 days.  There is no evidence of fetal cardiac activity. It is difficult to determine the location of this ectopic pregnancy but an interstitial/cornual ectopic pregnancy is not excluded.  The ovaries bilaterally are unremarkable. A trace amount of free fluid within the pelvis is noted.  IMPRESSION: Left periuterine ectopic pregnancy with fetal pole.  It is difficult to determine if this ectopic pregnancy lies in a cornual versus isthmic location.  Critical Value/emergent results were called by telephone at the time of interpretation on 06/23/2011  at 03:22 p.m.  to  [place], midwife, who verbally acknowledged these results.  Original Report Authenticated By: Rosendo Gros, M.D.   On exam, she had stable vital signs, and no peritoneal signs  .B+, Hgb 13.3.  Patient was counseled regarding need for laparoscopic salpingectomy, possible laparotomy especially in the event of a cornual ectopic. Risks of surgery including bleeding which may require transfusion or reoperation, infection, injury to bowel or other surrounding organs, need for  additional procedures including laparoscopy or laparotomy were explained to patient and written informed consent was obtained.  Patient has been NPO since 2230 on 06/22/11 and she will remain NPO for procedure. Anesthesia and OR aware.  To OR when ready.  Jaynie Collins, M.D. 06/23/2011 4:15 PM

## 2011-06-23 NOTE — Anesthesia Preprocedure Evaluation (Addendum)

## 2011-06-23 NOTE — Anesthesia Postprocedure Evaluation (Signed)
  Anesthesia Post-op Note  Patient: Brittany Quinn  Procedure(s) Performed: Procedure(s) (LRB): LAPAROSCOPY OPERATIVE (Left) LAPAROTOMY (Left)  Patient is awake and responsive. Pain and nausea are reasonably well controlled. Vital signs are stable and clinically acceptable. Oxygen saturation is clinically acceptable. There are no apparent anesthetic complications at this time. Patient is ready for discharge.

## 2011-06-24 LAB — CBC
HCT: 31.4 % — ABNORMAL LOW (ref 36.0–46.0)
Hemoglobin: 10.5 g/dL — ABNORMAL LOW (ref 12.0–15.0)
MCV: 94 fL (ref 78.0–100.0)
Platelets: 200 10*3/uL (ref 150–400)
RBC: 3.34 MIL/uL — ABNORMAL LOW (ref 3.87–5.11)
WBC: 12.6 10*3/uL — ABNORMAL HIGH (ref 4.0–10.5)

## 2011-06-24 MED ORDER — METRONIDAZOLE 500 MG PO TABS
500.0000 mg | ORAL_TABLET | Freq: Two times a day (BID) | ORAL | Status: DC
  Administered 2011-06-24 (×2): 500 mg via ORAL
  Filled 2011-06-24 (×3): qty 1

## 2011-06-24 NOTE — Progress Notes (Signed)
1 Day Post-Op Procedure(s) (LRB): LAPAROSCOPY DIAGNOSTIC (Left) LAPAROTOMY (Left) WEDGE RESECTION OF CORNUAL ECTOPIC (Left)  Subjective: Patient reports incisional pain and tolerating PO.  Not voided yet after foley removal about an hour ago.  No flatus yet.  Objective: I have reviewed patient's vital signs, intake and output, medications and labs.  General: alert and no distress Resp: clear to auscultation bilaterally Cardio: regular rate and rhythm GI: soft, non-tender; bowel sounds normal; no masses,  no organomegaly and incision: clean, dry, intact and minimal bloody drainage present Extremities: extremities normal, atraumatic, no cyanosis or edema and Homans sign is negative, no sign of DVT Vaginal Bleeding: minimal  Results for orders placed during the hospital encounter of 06/23/11 (from the past 24 hour(s))  WET PREP, GENITAL     Status: Abnormal   Collection Time   06/23/11  1:30 PM      Component Value Range   Yeast Wet Prep HPF POC NONE SEEN  NONE SEEN    Trich, Wet Prep NONE SEEN  NONE SEEN    Clue Cells Wet Prep HPF POC MODERATE (*) NONE SEEN    WBC, Wet Prep HPF POC FEW (*) NONE SEEN   HCG, QUANTITATIVE, PREGNANCY     Status: Abnormal   Collection Time   06/23/11  1:44 PM      Component Value Range   hCG, Beta Chain, Quant, S 4463 (*) <5 (mIU/mL)  CBC     Status: Normal   Collection Time   06/23/11  1:44 PM      Component Value Range   WBC 7.7  4.0 - 10.5 (K/uL)   RBC 4.17  3.87 - 5.11 (MIL/uL)   Hemoglobin 13.1  12.0 - 15.0 (g/dL)   HCT 16.1  09.6 - 04.5 (%)   MCV 94.7  78.0 - 100.0 (fL)   MCH 31.4  26.0 - 34.0 (pg)   MCHC 33.2  30.0 - 36.0 (g/dL)   RDW 40.9  81.1 - 91.4 (%)   Platelets 233  150 - 400 (K/uL)  URINALYSIS, ROUTINE W REFLEX MICROSCOPIC     Status: Abnormal   Collection Time   06/23/11  2:10 PM      Component Value Range   Color, Urine YELLOW  YELLOW    APPearance CLEAR  CLEAR    Specific Gravity, Urine 1.025  1.005 - 1.030    pH 6.0  5.0 - 8.0     Glucose, UA NEGATIVE  NEGATIVE (mg/dL)   Hgb urine dipstick LARGE (*) NEGATIVE    Bilirubin Urine NEGATIVE  NEGATIVE    Ketones, ur NEGATIVE  NEGATIVE (mg/dL)   Protein, ur NEGATIVE  NEGATIVE (mg/dL)   Urobilinogen, UA 0.2  0.0 - 1.0 (mg/dL)   Nitrite NEGATIVE  NEGATIVE    Leukocytes, UA NEGATIVE  NEGATIVE   URINE MICROSCOPIC-ADD ON     Status: Abnormal   Collection Time   06/23/11  2:10 PM      Component Value Range   Squamous Epithelial / LPF FEW (*) RARE    RBC / HPF 3-6  <3 (RBC/hpf)  CBC     Status: Abnormal   Collection Time   06/24/11  5:13 AM      Component Value Range   WBC 12.6 (*) 4.0 - 10.5 (K/uL)   RBC 3.34 (*) 3.87 - 5.11 (MIL/uL)   Hemoglobin 10.5 (*) 12.0 - 15.0 (g/dL)   HCT 78.2 (*) 95.6 - 46.0 (%)   MCV 94.0  78.0 - 100.0 (fL)  MCH 31.4  26.0 - 34.0 (pg)   MCHC 33.4  30.0 - 36.0 (g/dL)   RDW 16.1  09.6 - 04.5 (%)   Platelets 200  150 - 400 (K/uL)    Assessment: s/p Procedure(s): LAPAROSCOPY DIAGNOSTIC (Left) LAPAROTOMY (Left) WEDGE RESECTION OF CORNUAL ECTOPIC (Left): stable, progressing well and tolerating diet Bacterial vaginosis  Plan: Advance diet Encourage ambulation Advance to PO medication If stable, discharge tomorrow Metronidazole for BV as noted on wet prep Patient will need early cesarean section for any subsequent pregnancies, this was discussed with patient in detail.   LOS: 1 day    Vanita Cannell A 06/24/2011, 9:18 AM

## 2011-06-24 NOTE — Anesthesia Postprocedure Evaluation (Signed)
  Anesthesia Post-op Note  Patient: Brittany Quinn  Procedure(s) Performed: Procedure(s) (LRB): LAPAROSCOPY OPERATIVE (Left) LAPAROTOMY (Left)  Patient Location: 318  Anesthesia Type: General  Level of Consciousness: awake, alert  and oriented  Airway and Oxygen Therapy: Patient Spontanous Breathing  Post-op Pain: none  Post-op Assessment: Post-op Vital signs reviewed and Patient's Cardiovascular Status Stable  Post-op Vital Signs: Reviewed and stable  Complications: No apparent anesthesia complications

## 2011-06-24 NOTE — Progress Notes (Signed)
Pt. Given "Comfort Packet Ectopic Pregnancy" given to pt.

## 2011-06-24 NOTE — Progress Notes (Signed)
1 Day Post-Op Procedure(s) (LRB): LAPAROSCOPY OPERATIVE (Left) LAPAROTOMY (Left) With left salpingectomy and Wedge resection for cornual ectopic. Subjective: Patient reports incisional pain, tolerating PO and no problems voiding.  Pain controlled with p.o meds.   Objective: I have reviewed patient's vital signs, intake and output and labs. CBC    Component Value Date/Time   WBC 12.6* 06/24/2011 0513   RBC 3.34* 06/24/2011 0513   HGB 10.5* 06/24/2011 0513   HCT 31.4* 06/24/2011 0513   PLT 200 06/24/2011 0513   MCV 94.0 06/24/2011 0513   MCH 31.4 06/24/2011 0513   MCHC 33.4 06/24/2011 0513   RDW 12.6 06/24/2011 0513    General: alert, no distress and nonverbal, but denies any concerns or complaints GI: soft, non-tender; bowel sounds normal; no masses,  no organomegaly and incision: clean and intact, with dressing dry.  Extremities: Homans sign is negative, no sign of DVT  Assessment: s/p Procedure(s) (LRB): LAPAROSCOPY OPERATIVE (Left) LAPAROTOMY (Left): stable, progressing well, tolerating diet and anemia consistent with good postop course  Plan: Encourage ambulation Discontinue IV fluids shower, anticipate d/c in AM  LOS: 1 day    [place] Jaquith V 06/24/2011, 10:56 AM

## 2011-06-24 NOTE — Addendum Note (Signed)
Addendum  created 06/24/11 0946 by Graciela Husbands, CRNA   Modules edited:Notes Section

## 2011-06-25 ENCOUNTER — Encounter (HOSPITAL_COMMUNITY): Payer: Self-pay | Admitting: Obstetrics and Gynecology

## 2011-06-25 LAB — GC/CHLAMYDIA PROBE AMP, GENITAL: Chlamydia, DNA Probe: NEGATIVE

## 2011-06-25 MED ORDER — OXYCODONE-ACETAMINOPHEN 5-325 MG PO TABS: 1.0000 | ORAL_TABLET | ORAL | Status: AC | PRN

## 2011-06-25 NOTE — Discharge Instructions (Addendum)
Ectopic Pregnancy An ectopic pregnancy is when the fertilized egg attaches (implants) outside the uterus. Most ectopic pregnancies occur in the fallopian tube. Rarely do ectopic pregnancies occur on the ovary, intestine, pelvis, or cervix. An ectopic pregnancy does not have the ability to develop into a normal, healthy baby.  A ruptured ectopic pregnancy is one in which the fallopian tube gets torn or bursts and results in internal bleeding. Often there is intense abdominal pain, and sometimes, vaginal bleeding. Having an ectopic pregnancy can be a life-threatening experience. If left untreated, this dangerous condition can lead to a blood transfusion, abdominal surgery, or even death. CAUSES  Damage to the fallopian tubes is the suspected cause in most ectopic pregnancies.  RISK FACTORS Depending on your circumstances, the amount of risk of having an ectopic pregnancy will vary. There are 3 categories that may help you identify whether you are potentially at risk. High Risk  You have gone through infertility treatment.   You have had a previous ectopic pregnancy.   You have had previous tubal surgery.   You have had previous surgery to have the fallopian tubes tied (tubal ligation).   You have tubal problems or diseases.   You have been exposed to DES. DES is a medicine that was used until 1971 and had effects on babies whose mothers took the medicine.   You become pregnant while using an intrauterine device (IUD) for birth control.  Moderate Risk  You have a history of infertility.   You have a history of a sexually transmitted infection (STI).   You have a history of pelvic inflammatory disease (PID).   You have scarring from endometriosis.   You have multiple sexual partners.   You smoke.  Low Risk  You have had previous pelvic surgery.   You use vaginal douching.   You became sexually active before 22 years of age.  SYMPTOMS  An ectopic pregnancy should be suspected  in anyone who has missed a period and has abdominal pain or bleeding.  You may experience normal pregnancy symptoms, such as:   Nausea.   Tiredness.   Breast tenderness.   Symptoms that are not normal include:   Pain with intercourse.   Irregular vaginal bleeding or spotting.   Cramping or pain on one side, or in the lower abdomen.   Fast heartbeat.   Passing out while having a bowel movement.   Symptoms of a ruptured ectopic pregnancy and internal bleeding may include:   Sudden, severe pain in the abdomen and pelvis.   Dizziness or fainting.   Pain in the shoulder area.  DIAGNOSIS  Tests that may be performed include:  A pregnancy test.   An ultrasound.   Testing the specific level of pregnancy hormone in the bloodstream.   Taking a sample of uterus tissue (dilation and curettage, D&C).   Surgery to perform a visual exam of the inside of the abdomen using a lighted tube (laparoscopy).  TREATMENT  An injection of methotrexate medicine may be given. This is given if:  The diagnosis is made early.   The fallopian tube has not ruptured.   You are considered to be a good candidate for the medicine.  Usually, pregnancy hormone blood levels are checked after methotrexate treatment. This is to be sure the medicine is effective. It may take 4 to 6 weeks for the pregnancy to be absorbed (though most pregnancies will be absorbed by 3 weeks). Surgical treatment may be needed. A lighted tube (laparoscope) is   used to remove the tubal pregnancy. If severe internal bleeding occurs, a cut (incision) may be made in the lower abdomen (laparotomy), and the ectopic pregnancy is removed. This stops the bleeding. Part of the fallopian tube, or the whole tube, may be removed as well (salpingectomy). After surgery, pregnancy hormone tests may be done to be sure there is no pregnancy tissue left. You may receive a RhoGAM shot if you are Rh negative and the father is Rh positive, or if you do  not know the Rh type of the father. This is to prevent problems with any future pregnancy. SEEK IMMEDIATE MEDICAL CARE IF:  You have any symptoms of an ectopic pregnancy. This is a medical emergency. Document Released: 05/10/2004 Document Revised: 03/22/2011 Document Reviewed: 12/07/2010 [facility] Patient Information 2012 [place], [place].Ruptured Ectopic Pregnancy An ectopic pregnancy is when the fertilized egg attaches (implants) outside the uterus. Most ectopic pregnancies occur in the fallopian tube. Rarely do ectopic pregnancies occur on the ovary, intestine, pelvis, or cervix. An ectopic pregnancy does not have the ability to develop into a normal, healthy baby.  A ruptured ectopic pregnancy is one in which the fallopian tube gets torn or bursts and results in internal bleeding. Often there is intense abdominal pain, and sometimes, vaginal bleeding. Having an ectopic pregnancy can be a life-threatening experience. If left untreated, this dangerous condition can lead to a blood transfusion, abdominal surgery, or even death.  CAUSES  Damage to the fallopian tubes is the suspected cause in most ectopic pregnancies.  RISK FACTORS Depending on your circumstances, the amount of risk of having an ectopic pregnancy will vary. There are 3 categories that may help you identify whether you are potentially at risk. High Risk  You have gone through infertility treatment.   You have had a previous ectopic pregnancy.   You have had previous tubal surgery.   You have had previous surgery to have the fallopian tubes tied (tubal ligation).   You have tubal problems or diseases.   You have been exposed to DES. DES is a medicine that was used until 1971 and had effects on babies whose mothers took the medicine.   You become pregnant while using an intrauterine device (IUD) for birth control.  Moderate Risk  You have a history of infertility.   You have a history of a sexually transmitted infection  (STI).   You have a history of pelvic inflammatory disease (PID).   You have scarring from endometriosis.   You have multiple sexual partners.   You smoke.  Low Risk  You have had previous pelvic surgery.   You use vaginal douching.   You became sexually active before 22 years of age.  SYMPTOMS An ectopic pregnancy should be suspected in anyone who has missed a period and has abdominal pain or bleeding.  You may experience normal pregnancy symptoms, such as:   Nausea.   Tiredness.   Breast tenderness.   Symptoms that are not normal include:   Pain with intercourse.   Irregular vaginal bleeding or spotting.   Cramping or pain on one side, or in the lower abdomen.   Fast heartbeat.   Passing out while having a bowel movement.   Symptoms of a ruptured ectopic pregnancy and internal bleeding may include:   Sudden, severe pain in the abdomen and pelvis.   Dizziness or fainting.   Pain in the shoulder area.  DIAGNOSIS  Tests that may be performed include:  A pregnancy test.   An ultrasound.  Testing the specific level of pregnancy hormone in the bloodstream.   Taking a sample of uterus tissue (dilation and curettage, D&C).   Surgery to perform a visual exam of the inside of the abdomen using a lighted tube (laparoscopy).  TREATMENT  Laparoscopic surgery or abdominal surgery is recommended for a ruptured ectopic pregnancy.   The whole fallopian tube may need to be removed (salpingectomy).   If the tube is not too damaged, the tube may be saved, and the pregnancy will be surgically removed. Intime, the tube may still function.   If you have lost a lot of blood, you may need a blood transfusion.   You may receive a RhoGAM shot if you are Rh negative and the father is Rh positive, or if you do not know the Rh type of the father. This is to prevent problems with any future pregnancy.  SEEK IMMEDIATE MEDICAL CARE IF:  You have any symptoms of an ectopic or  ruptured ectopic pregnancy. This is a medical emergency. Document Released: 03/30/2000 Document Revised: 03/22/2011 Document Reviewed: 12/07/2010 [facility] Patient Information 2012 [place], [place].

## 2011-06-25 NOTE — Progress Notes (Signed)
UR chart review completed.  

## 2011-06-25 NOTE — Progress Notes (Signed)
2 Days Post-Op Procedure(s) (LRB): LAPAROSCOPY OPERATIVE (Left) LAPAROTOMY (Left) left cornual resection of ectopic  Subjective: Patient reports tolerating PO, + flatus, + BM and no problems voiding.    Objective: I have reviewed patient's vital signs, intake and output and medications.  General: alert and no distress GI: soft, non-tender; bowel sounds normal; no masses,  no organomegaly and incision: clean, dry and intact Extremities: extremities normal, atraumatic, no cyanosis or edema  Assessment: s/p Procedure(s) (LRB): LAPAROSCOPY OPERATIVE (Left) LAPAROTOMY (Left): stable, progressing well, tolerating diet and stable for discharge  Plan: Discharge home  LOS: 2 days    Lafonda Patron V 06/25/2011, 7:37 AM

## 2011-06-25 NOTE — Progress Notes (Signed)
D/C instructions reviewed with pt. And s.o.  Verbally states understanding of home care.  No home equipment needed.  Ambulated to car with staff without incident.  D/c'd home with family.

## 2011-06-25 NOTE — Discharge Summary (Signed)
Physician Discharge Summary  Patient ID: Brittany Quinn MRN: [id number] DOB/AGE: Feb 04, 1991 21 y.o.  Admit date: 06/23/2011 Discharge date: 06/25/2011  Admission Diagnoses:Left ruptured ectopic preganancy  Discharge Diagnoses: Left cornual ruptured ectopic pregnancy Active Problems:  * No active hospital problems. *    Discharged Condition: good  Hospital Course: Admitted with ultrasound diagnosis of large ectopic, possible cornual ectopic, went to laparoscopy, converted to laparotomy, and cornual wedge resection of left tube and cornu performed. CBC    Component Value Date/Time   WBC 12.6* 06/24/2011 0513   RBC 3.34* 06/24/2011 0513   HGB 10.5* 06/24/2011 0513   HCT 31.4* 06/24/2011 0513   PLT 200 06/24/2011 0513   MCV 94.0 06/24/2011 0513   MCH 31.4 06/24/2011 0513   MCHC 33.4 06/24/2011 0513   RDW 12.6 06/24/2011 0513   No transfusions required   Consults: None  Significant Diagnostic Studies: labs:  Hemoglobin & Hematocrit     Component Value Date/Time   HGB 10.5* 06/24/2011 0513   HCT 31.4* 06/24/2011 0513   Blood type B positive  Treatments: surgery: left cornual wedge resection of ectopic  Discharge Exam: Blood pressure 115/64, pulse 99, temperature 98.4 F (36.9 C), temperature source Oral, resp. rate 18, height 5\' 3"  (1.6 m), weight 78.926 kg (174 lb), last menstrual period 03/30/2011, SpO2 99.00%, unknown if currently breastfeeding. General appearance: alert, cooperative and no distress Resp: clear to auscultation bilaterally Chest wall: no tenderness Cardio: regular rate and rhythm, S1, S2 normal, no murmur, click, rub or gallop GI: soft, non-tender; bowel sounds normal; no masses,  no organomegaly  Disposition: 01-Home or Self Care  Discharge Orders    Future Orders Please Complete By Expires   Diet - low sodium heart healthy      Increase activity slowly      Discharge instructions      Comments:   General Gynecological Post-Operative Instructions You may  expect to feel dizzy, weak, and drowsy for as long as 24 hours after receiving the medicine that made you sleep (anesthetic). The following information pertains to your recovery period for the first 24 hours following surgery.  Do not drive a car, ride a bicycle, participate in physical activities, or take public transportation until you are done taking narcotic pain medicines or as directed by your caregiver.  Do not drink alcohol or take tranquilizers.  Do not take medicine that has not been prescribed by your caregiver.  Do not sign important papers or make important decisions while on narcotic pain medicines.  Have a responsible person with you.  CARE OF INCISION  Keep incision clean and dry. Take showers instead of baths until your caregiver gives you permission to take baths. Check with your caregiver if you have tubes coming from the wound site.  Avoid heavy lifting (more than 10 pounds/4.5 kilograms), pushing, or pulling.  Avoid activities that may risk injury to your surgical site.  Only take over-the-counter or prescription medicines for pain, discomfort, or fever as directed by your caregiver. Do not take aspirin. It can make you bleed. Take medicines (antibiotics) that kill germs as directed.  Call the office or go to the MAU if:  You feel sick to your stomach (nauseous).  You start to throw up (vomit).  You have trouble eating or drinking.  You have an oral temperature above 100.4.  You have constipation that is not helped by adjusting diet or increasing fluid intake. Pain medicines are a common cause of constipation.  SEEK IMMEDIATE MEDICAL  CARE IF:  You have persistent dizziness.  You have difficulty breathing or a congested sounding (croupy) cough.  You have an oral temperature above 102.5, not controlled by medicine.  There is increasing pain or tenderness near or in the surgical site.  [employer] Patient Information 2011 [place], [place].   Sexual Activity Restrictions       Comments:   No sex x 4 weeks, make appointment for postop visit before resuming sex   No wound care      Call MD for:  temperature >100.4      Call MD for:  severe uncontrolled pain        Medication List  As of 06/25/2011  7:48 AM   TAKE these medications         oxyCODONE-acetaminophen 5-325 MG per tablet   Commonly known as: PERCOCET   Take 1 tablet by mouth every 4 (four) hours as needed (moderate to severe pain (when tolerating fluids)).      prenatal multivitamin Tabs   Take 1 tablet by mouth at bedtime.           Follow-up Information    Follow up in 4 weeks. (or earlier as needed if symptoms worsen)          SignedTilda Burrow 06/25/2011, 7:48 AM

## 2011-06-26 ENCOUNTER — Encounter: Payer: Self-pay | Admitting: Obstetrics & Gynecology

## 2011-06-29 ENCOUNTER — Other Ambulatory Visit: Payer: Self-pay

## 2011-06-29 DIAGNOSIS — O008 Other ectopic pregnancy without intrauterine pregnancy: Secondary | ICD-10-CM

## 2011-07-19 ENCOUNTER — Ambulatory Visit (INDEPENDENT_AMBULATORY_CARE_PROVIDER_SITE_OTHER): Payer: Medicaid Other | Admitting: Family Medicine

## 2011-07-19 VITALS — BP 122/71 | HR 80 | Temp 98.6°F | Ht 63.0 in | Wt 173.5 lb

## 2011-07-19 DIAGNOSIS — Z09 Encounter for follow-up examination after completed treatment for conditions other than malignant neoplasm: Secondary | ICD-10-CM

## 2011-07-19 NOTE — Progress Notes (Signed)
History Doing well, post-op.  Desires Nexplanon  Physical exam Filed Vitals:   07/19/11 0930  BP: 122/71  Pulse: 80  Temp: 98.6 F (37 C)   Incision is well healed.   Assessment S/p laparotomy and cornual excision for ectopic.  Plan For Nexplanon.

## 2011-07-19 NOTE — Patient Instructions (Signed)
Contraception Choices Contraception (birth control) is the use of any methods or devices to prevent pregnancy. Below are some methods to help avoid pregnancy. HORMONAL METHODS   Contraceptive implant. This is a thin, plastic tube containing progesterone hormone. It does not contain estrogen hormone. Your caregiver inserts the tube in the inner part of the upper arm. The tube can remain in place for up to 3 years. After 3 years, the implant must be removed. The implant prevents the ovaries from releasing an egg (ovulation), thickens the cervical mucus which prevents sperm from entering the uterus, and thins the lining of the inside of the uterus.   Progesterone-only injections. These injections are given every 3 months by your caregiver to prevent pregnancy. This synthetic progesterone hormone stops the ovaries from releasing eggs. It also thickens cervical mucus and changes the uterine lining. This makes it harder for sperm to survive in the uterus.   Birth control pills. These pills contain estrogen and progesterone hormone. They work by stopping the egg from forming in the ovary (ovulation). Birth control pills are prescribed by a caregiver.Birth control pills can also be used to treat heavy periods.   Minipill. This type of birth control pill contains only the progesterone hormone. They are taken every day of each month and must be prescribed by your caregiver.   Birth control patch. The patch contains hormones similar to those in birth control pills. It must be changed once a week and is prescribed by a caregiver.   Vaginal ring. The ring contains hormones similar to those in birth control pills. It is left in the vagina for 3 weeks, removed for 1 week, and then a new one is put back in place. The patient must be comfortable inserting and removing the ring from the vagina.A caregiver's prescription is necessary.   Emergency contraception. Emergency contraceptives prevent pregnancy after  unprotected sexual intercourse. This pill can be taken right after sex or up to 5 days after unprotected sex. It is most effective the sooner you take the pills after having sexual intercourse. Emergency contraceptive pills are available without a prescription. Check with your pharmacist. Do not use emergency contraception as your only form of birth control.  BARRIER METHODS   Female condom. This is a thin sheath (latex or rubber) that is worn over the penis during sexual intercourse. It can be used with spermicide to increase effectiveness.   Female condom. This is a soft, loose-fitting sheath that is put into the vagina before sexual intercourse.   Diaphragm. This is a soft, latex, dome-shaped barrier that must be fitted by a caregiver. It is inserted into the vagina, along with a spermicidal jelly. It is inserted before intercourse. The diaphragm should be left in the vagina for 6 to 8 hours after intercourse.   Cervical cap. This is a round, soft, latex or plastic cup that fits over the cervix and must be fitted by a caregiver. The cap can be left in place for up to 48 hours after intercourse.   Sponge. This is a soft, circular piece of polyurethane foam. The sponge has spermicide in it. It is inserted into the vagina after wetting it and before sexual intercourse.   Spermicides. These are chemicals that kill or block sperm from entering the cervix and uterus. They come in the form of creams, jellies, suppositories, foam, or tablets. They do not require a prescription. They are inserted into the vagina with an applicator before having sexual intercourse. The process must be   repeated every time you have sexual intercourse.  INTRAUTERINE CONTRACEPTION  Intrauterine device (IUD). This is a T-shaped device that is put in a woman's uterus during a menstrual period to prevent pregnancy. There are 2 types:   Copper IUD. This type of IUD is wrapped in copper wire and is placed inside the uterus. Copper  makes the uterus and fallopian tubes produce a fluid that kills sperm. It can stay in place for 10 years.   Hormone IUD. This type of IUD contains the hormone progestin (synthetic progesterone). The hormone thickens the cervical mucus and prevents sperm from entering the uterus, and it also thins the uterine lining to prevent implantation of a fertilized egg. The hormone can weaken or kill the sperm that get into the uterus. It can stay in place for 5 years.  PERMANENT METHODS OF CONTRACEPTION  Female tubal ligation. This is when the woman's fallopian tubes are surgically sealed, tied, or blocked to prevent the egg from traveling to the uterus.   Female sterilization. This is when the female has the tubes that carry sperm tied off (vasectomy).This blocks sperm from entering the vagina during sexual intercourse. After the procedure, the man can still ejaculate fluid (semen).  NATURAL PLANNING METHODS  Natural family planning. This is not having sexual intercourse or using a barrier method (condom, diaphragm, cervical cap) on days the woman could become pregnant.   Calendar method. This is keeping track of the length of each menstrual cycle and identifying when you are fertile.   Ovulation method. This is avoiding sexual intercourse during ovulation.   Symptothermal method. This is avoiding sexual intercourse during ovulation, using a thermometer and ovulation symptoms.   Post-ovulation method. This is timing sexual intercourse after you have ovulated.  Regardless of which type or method of contraception you choose, it is important that you use condoms to protect against the transmission of sexually transmitted diseases (STDs). Talk with your caregiver about which form of contraception is most appropriate for you. Document Released: 04/02/2005 Document Revised: 03/22/2011 Document Reviewed: 08/09/2010 [employer] Patient Information 2012 [employer]. 

## 2011-08-20 ENCOUNTER — Encounter: Payer: Self-pay | Admitting: Obstetrics & Gynecology

## 2011-08-20 ENCOUNTER — Ambulatory Visit (INDEPENDENT_AMBULATORY_CARE_PROVIDER_SITE_OTHER): Payer: Medicaid Other | Admitting: Obstetrics & Gynecology

## 2011-08-20 VITALS — BP 117/78 | HR 80 | Temp 97.9°F | Ht 62.0 in | Wt 171.4 lb

## 2011-08-20 DIAGNOSIS — Z01812 Encounter for preprocedural laboratory examination: Secondary | ICD-10-CM

## 2011-08-20 DIAGNOSIS — Z975 Presence of (intrauterine) contraceptive device: Secondary | ICD-10-CM

## 2011-08-20 DIAGNOSIS — Z3049 Encounter for surveillance of other contraceptives: Secondary | ICD-10-CM

## 2011-08-20 DIAGNOSIS — Z30017 Encounter for initial prescription of implantable subdermal contraceptive: Secondary | ICD-10-CM

## 2011-08-20 MED ORDER — ETONOGESTREL 68 MG ~~LOC~~ IMPL
68.0000 mg | DRUG_IMPLANT | Freq: Once | SUBCUTANEOUS | Status: AC
  Administered 2011-08-20: 68 mg via SUBCUTANEOUS

## 2011-08-20 NOTE — Progress Notes (Signed)
Nexplanon Insertion Procedure Patient given informed consent, she signed consent form. Pregnancy test was negative.  Appropriate time out taken.  Patient's left arm was prepped and draped in the usual sterile fashion.. The ruler used to measure and mark insertion area.  Patient was prepped with alcohol swab and then injected with 5 ml of 1 % lidocaine.  She was prepped with betadine, Nexplanon removed from packaging,  Device confirmed in needle, then inserted full length of needle and withdrawn per handbook instructions.  There was minimal blood loss.  Patient insertion site covered with guaze and a pressure bandage to reduce any bruising.  The patient tolerated the procedure well and was given post procedure instructions. Return in about one month for Nexplanon check.

## 2011-08-20 NOTE — Patient Instructions (Signed)

## 2011-09-19 ENCOUNTER — Ambulatory Visit: Payer: Medicaid Other | Admitting: Physician Assistant

## 2011-10-15 ENCOUNTER — Ambulatory Visit (INDEPENDENT_AMBULATORY_CARE_PROVIDER_SITE_OTHER): Payer: Medicaid Other | Admitting: Physician Assistant

## 2011-10-15 ENCOUNTER — Encounter: Payer: Self-pay | Admitting: Physician Assistant

## 2011-10-15 VITALS — BP 123/80 | HR 97 | Temp 98.4°F | Ht 62.0 in | Wt 171.7 lb

## 2011-10-15 DIAGNOSIS — Z113 Encounter for screening for infections with a predominantly sexual mode of transmission: Secondary | ICD-10-CM

## 2011-10-16 ENCOUNTER — Telehealth: Payer: Self-pay | Admitting: *Deleted

## 2011-10-16 LAB — HEPATITIS B SURFACE ANTIGEN: Hepatitis B Surface Ag: NEGATIVE

## 2011-10-16 LAB — GC/CHLAMYDIA PROBE AMP, GENITAL: Chlamydia, DNA Probe: NEGATIVE

## 2011-10-16 LAB — HEPATITIS C ANTIBODY: HCV Ab: NEGATIVE

## 2011-10-16 LAB — WET PREP, GENITAL: Yeast Wet Prep HPF POC: NONE SEEN

## 2011-10-16 MED ORDER — TINIDAZOLE 500 MG PO TABS: ORAL_TABLET | ORAL | Status: AC

## 2011-10-16 NOTE — Telephone Encounter (Signed)
After consult w/[facility], I called pt and left message that her Rx has been sent to her pharmacy. She may call back for any additional questions.

## 2011-10-16 NOTE — Telephone Encounter (Signed)
Pt left message stating that her pharmacy had not received the Rx info from [facility] today. Please call back.

## 2011-10-17 ENCOUNTER — Telehealth: Payer: Self-pay | Admitting: *Deleted

## 2011-10-17 NOTE — Telephone Encounter (Signed)
Called patient and left a message that we have some information for you, please call clinic during office hours.

## 2011-10-17 NOTE — Telephone Encounter (Signed)
Bret called back , informed her of + trich and BV, and that partner needs tx for the trich-and they should not have intercourse until both have been treated-  informed her medication has been sent to pharmacy and it will treat both- patient states was notified by pharmacy medicine was ready and she got it and started yesterday. No other questions.

## 2011-10-17 NOTE — Telephone Encounter (Signed)
Message copied by Gerome Apley on Wed Oct 17, 2011  1:16 PM ------      Message from: Maylon Cos E      Created: Wed Oct 17, 2011  1:01 PM       Please call pt with result. Medication called will treat both BV and Trich. Partner tx needed to prevent re-infxn

## 2012-05-19 ENCOUNTER — Encounter: Payer: Self-pay | Admitting: Physician Assistant

## 2012-05-19 NOTE — Patient Instructions (Signed)
Return to clinic for any scheduled appointments or for any gynecologic concerns as needed.   

## 2012-05-19 NOTE — Progress Notes (Signed)
History:  22 y.o. G2P0010 here today for Nexplanon check, placed on 08/20/2011.  Also desires STI testing.  The following portions of the patient's history were reviewed and updated as appropriate: allergies, current medications, past family history, past medical history, past social history, past surgical history and problem list.  Review of Systems:  Pertinent items are noted in HPI.  Objective:  Physical Exam BP 123/80  Pulse 97  Temp 98.4 F (36.9 C) (Oral)  Ht 5\' 2"  (1.575 m)  Wt 171 lb 11.2 oz (77.883 kg)  BMI 31.40 kg/m2  LMP 08/20/2011 Gen: NAD Arm: Nexplanon in place in left arm, no erythema, bruising noted. Abd: Soft, nontender and nondistended Pelvic: Normal appearing external genitalia; normal appearing vaginal mucosa and cervix.  White discharge seen, GC/Chlam and wet prep samples obtained.  Small uterus, no other palpable masses, no uterine or adnexal tenderness  Assessment & Plan:  Follow up STI results and manage accordingly Nexplanon in place, no issues

## 2012-10-12 ENCOUNTER — Encounter (HOSPITAL_COMMUNITY): Payer: Self-pay | Admitting: Family

## 2012-10-12 ENCOUNTER — Inpatient Hospital Stay (HOSPITAL_COMMUNITY)
Admission: AD | Admit: 2012-10-12 | Discharge: 2012-10-12 | Disposition: A | Discharge status: Home / Self Care | Payer: Medicaid Other | Source: Ambulatory Visit | Attending: Obstetrics & Gynecology | Admitting: Obstetrics & Gynecology

## 2012-10-12 DIAGNOSIS — N760 Acute vaginitis: Secondary | ICD-10-CM | POA: Insufficient documentation

## 2012-10-12 DIAGNOSIS — B9689 Other specified bacterial agents as the cause of diseases classified elsewhere: Secondary | ICD-10-CM | POA: Insufficient documentation

## 2012-10-12 DIAGNOSIS — A499 Bacterial infection, unspecified: Secondary | ICD-10-CM

## 2012-10-12 DIAGNOSIS — N949 Unspecified condition associated with female genital organs and menstrual cycle: Secondary | ICD-10-CM | POA: Insufficient documentation

## 2012-10-12 DIAGNOSIS — R109 Unspecified abdominal pain: Secondary | ICD-10-CM | POA: Insufficient documentation

## 2012-10-12 LAB — URINE MICROSCOPIC-ADD ON

## 2012-10-12 LAB — URINALYSIS, ROUTINE W REFLEX MICROSCOPIC
Nitrite: NEGATIVE
Specific Gravity, Urine: 1.025 (ref 1.005–1.030)
Urobilinogen, UA: 1 mg/dL (ref 0.0–1.0)

## 2012-10-12 MED ORDER — METRONIDAZOLE 500 MG PO TABS: 500.0000 mg | ORAL_TABLET | Freq: Two times a day (BID) | ORAL | Status: DC

## 2012-10-12 NOTE — MAU Note (Addendum)
Patient presents to MAU with c/o increased malodorous cloudy vaginal discharge x 4-5 days. Reports new sexual contact.  Patient has Implanon x 14 months. Reports mild lower abdominal cramping x 2 days. Reports brown spotting began last night.

## 2012-10-13 LAB — GC/CHLAMYDIA PROBE AMP
CT Probe RNA: NEGATIVE
GC Probe RNA: NEGATIVE

## 2013-01-15 ENCOUNTER — Telehealth: Payer: Self-pay | Admitting: *Deleted

## 2013-01-15 NOTE — Telephone Encounter (Signed)
Patient called and stated that she wants to make an appt to have her nexplanon removed. I called her back and left voicemail that she should call back and speak to front desk to schedule appt.

## 2013-10-29 ENCOUNTER — Ambulatory Visit: Payer: Medicaid Other | Admitting: Advanced Practice Midwife

## 2013-11-30 ENCOUNTER — Ambulatory Visit: Payer: Medicaid Other | Admitting: Obstetrics & Gynecology

## 2014-01-06 ENCOUNTER — Ambulatory Visit (INDEPENDENT_AMBULATORY_CARE_PROVIDER_SITE_OTHER): Payer: Medicaid Other | Admitting: Obstetrics & Gynecology

## 2014-01-06 ENCOUNTER — Other Ambulatory Visit (HOSPITAL_COMMUNITY)
Admission: RE | Admit: 2014-01-06 | Discharge: 2014-01-06 | Disposition: A | Discharge status: Home / Self Care | Payer: Medicaid Other | Source: Ambulatory Visit | Attending: Obstetrics & Gynecology | Admitting: Obstetrics & Gynecology

## 2014-01-06 ENCOUNTER — Encounter: Payer: Self-pay | Admitting: Obstetrics & Gynecology

## 2014-01-06 VITALS — BP 132/72 | HR 83 | Temp 98.4°F | Ht 63.0 in | Wt 181.3 lb

## 2014-01-06 DIAGNOSIS — Z308 Encounter for other contraceptive management: Secondary | ICD-10-CM | POA: Diagnosis not present

## 2014-01-06 DIAGNOSIS — Z01419 Encounter for gynecological examination (general) (routine) without abnormal findings: Secondary | ICD-10-CM | POA: Insufficient documentation

## 2014-01-06 DIAGNOSIS — Z113 Encounter for screening for infections with a predominantly sexual mode of transmission: Secondary | ICD-10-CM | POA: Insufficient documentation

## 2014-01-06 DIAGNOSIS — Z01810 Encounter for preprocedural cardiovascular examination: Secondary | ICD-10-CM | POA: Diagnosis not present

## 2014-01-06 NOTE — Patient Instructions (Signed)
Pap Test A Pap test is a procedure done in a clinic office to evaluate cells that are on the surface of the cervix. The cervix is the lower portion of the uterus and upper portion of the vagina. For some women, the cervical region has the potential to form cancer. With consistent evaluations by your caregiver, this type of cancer can be prevented.  If a Pap test is abnormal, it is most often a result of a previous exposure to human papillomavirus (HPV). HPV is a virus that can infect the cells of the cervix and cause dysplasia. Dysplasia is where the cells no longer look normal. If a woman has been diagnosed with high-grade or severe dysplasia, they are at higher risk of developing cervical cancer. People diagnosed with low-grade dysplasia should still be seen by their caregiver because there is a small chance that low-grade dysplasia could develop into cancer.  LET YOUR CAREGIVER KNOW ABOUT:  Recent sexually transmitted infection (STI) you have had.  Any new sex partners you have had.  History of previous abnormal Pap tests results.  History of previous cervical procedures you have had (colposcopy, biopsy, loop electrosurgical excision procedure [LEEP]).  Concerns you have had regarding unusual vaginal discharge.  History of pelvic pain.  Your use of birth control. BEFORE THE PROCEDURE  Ask your caregiver when to schedule your Pap test. It is best not to be on your period if your caregiver uses a wooden spatula to collect cells or applies cells to a glass slide. Newer techniques are not so sensitive to the timing of a menstrual cycle.  Do not douche or have sexual intercourse for 24 hours before the test.   Do not use vaginal creams or tampons for 24 hours before the test.   Empty your bladder just before the test to lessen any discomfort.  PROCEDURE You will lie on an exam table with your feet in stirrups. A warm metal or plastic instrument (speculum) is placed in your vagina. This  instrument allows your caregiver to see the inside of your vagina and look at your cervix. A small, plastic brush or wooden spatula is then used to collect cervical cells. These cells are placed in a lab specimen container. The cells are looked at under a microscope. A specialist will determine if the cells are normal.  AFTER THE PROCEDURE Make sure to get your test results.If your results come back abnormal, you may need further testing.  Document Released: 06/23/2002 Document Revised: 06/25/2011 Document Reviewed: 03/29/2011 Exit[employer] Patient Information 2015 [employer]. This information is not intended to replace advice given to you by your health care provider. Make sure you discuss any questions you have with your health care provider.  

## 2014-01-06 NOTE — Progress Notes (Signed)
Patient ID: Brittany Quinn, female   DOB: 06-24-1990, 23 y.o.   MRN: [id number] Subjective:     Brittany Quinn is a 23 y.o. female here for a routine exam.  Current complaints: started spotting today.  She denies GYN problems today   Gynecologic History Patient's last menstrual period was 12/06/2013. Contraception: Nexplanon Last Pap: unknonw at the [facility]. Results were: normal Last mammogram: n/a.   Obstetric History OB History  Gravida Para Term Preterm AB SAB TAB Ectopic Multiple Living  # Outcome Date GA Lbr Len/2nd Weight Sex Delivery Anes PTL Lv  2 GRA              Comments: System Generated. Please review and update pregnancy details.  1 SAB                The following portions of the patient's history were reviewed and updated as appropriate: allergies, current medications, past family history, past medical history, past social history, past surgical history and problem list.  Review of Systems A comprehensive review of systems was negative.    Objective:    BP 132/72  Pulse 83  Temp(Src) 98.4 F (36.9 C) (Oral)  Ht  (1.6 m)  Wt 181 lb 4.8 oz (82.237 kg)  BMI 32.12 kg/m2  LMP 12/06/2013  General Appearance:    Alert, cooperative, no distress, appears stated age                    Back:     Symmetric, no curvature, ROM normal, no CVA tenderness  Lungs:     Clear to auscultation bilaterally, respirations unlabored  Chest Wall:    No tenderness or deformity   Heart:    Regular rate and rhythm, S1 and S2 normal, no murmur, rub   or gallop  Breast Exam:    No tenderness, masses, or nipple abnormality  Abdomen:     Soft, non-tender, bowel sounds active all four quadrants,    no masses, no organomegaly  Genitalia:    Normal female without lesion, discharge or tenderness     Extremities:   Extremities normal, atraumatic, no cyanosis or edema  Pulses:   2+ and symmetric all extremities  Skin:   Skin color, texture, turgor normal, no rashes or lesions             Assessment:    Healthy female exam.  Safe sex counseling   Plan:    Follow up in: 1 year.   F/u sooner prn Reviewed new PAP guidelines Condoms with intercourse

## 2014-01-08 LAB — CYTOLOGY - PAP

## 2014-01-12 ENCOUNTER — Telehealth: Payer: Self-pay | Admitting: *Deleted

## 2014-01-12 NOTE — Telephone Encounter (Addendum)
Pt is requesting Pap results. She also stated that she is still bleeding x1 week. Please call back.  I returned pt's call and left message stating that we need her permission to leave details on her voice mail. Please call back and let us know if this will be acceptable.  **Pt had negative Pap and neg GC/Chlamydia.  Her LMP had been reported as 8/23, so the bleeding is likely to be her regular menstrual period.

## 2014-01-13 NOTE — Telephone Encounter (Signed)
Pt returned call from the clinic.  She is requesting a call back with results.  Contacted patient and informed of results, pt verbalizes understanding.

## 2014-02-15 ENCOUNTER — Encounter: Payer: Self-pay | Admitting: Obstetrics & Gynecology

## 2014-03-24 ENCOUNTER — Encounter (HOSPITAL_COMMUNITY): Payer: Self-pay | Admitting: *Deleted

## 2014-03-24 ENCOUNTER — Emergency Department (HOSPITAL_COMMUNITY)
Admission: EM | Admit: 2014-03-24 | Discharge: 2014-03-24 | Disposition: A | Discharge status: Home / Self Care | Payer: Medicaid Other | Attending: Emergency Medicine | Admitting: Emergency Medicine

## 2014-03-24 DIAGNOSIS — Z3202 Encounter for pregnancy test, result negative: Secondary | ICD-10-CM | POA: Diagnosis not present

## 2014-03-24 DIAGNOSIS — R630 Anorexia: Secondary | ICD-10-CM | POA: Insufficient documentation

## 2014-03-24 DIAGNOSIS — R51 Headache: Secondary | ICD-10-CM | POA: Diagnosis present

## 2014-03-24 DIAGNOSIS — Z720 Tobacco use: Secondary | ICD-10-CM | POA: Diagnosis not present

## 2014-03-24 DIAGNOSIS — G43009 Migraine without aura, not intractable, without status migrainosus: Secondary | ICD-10-CM | POA: Diagnosis not present

## 2014-03-24 DIAGNOSIS — Z8619 Personal history of other infectious and parasitic diseases: Secondary | ICD-10-CM | POA: Insufficient documentation

## 2014-03-24 DIAGNOSIS — M791 Myalgia: Secondary | ICD-10-CM | POA: Diagnosis not present

## 2014-03-24 LAB — COMPREHENSIVE METABOLIC PANEL
ALK PHOS: 58 U/L (ref 39–117)
ALT: 11 U/L (ref 0–35)
AST: 10 U/L (ref 0–37)
Albumin: 3.4 g/dL — ABNORMAL LOW (ref 3.5–5.2)
Anion gap: 12 (ref 5–15)
BILIRUBIN TOTAL: 0.4 mg/dL (ref 0.3–1.2)
BUN: 7 mg/dL (ref 6–23)
CALCIUM: 8.8 mg/dL (ref 8.4–10.5)
CHLORIDE: 105 meq/L (ref 96–112)
CO2: 24 mEq/L (ref 19–32)
Creatinine, Ser: 0.64 mg/dL (ref 0.50–1.10)
GLUCOSE: 92 mg/dL (ref 70–99)
POTASSIUM: 3.9 meq/L (ref 3.7–5.3)
SODIUM: 141 meq/L (ref 137–147)
Total Protein: 6.9 g/dL (ref 6.0–8.3)

## 2014-03-24 LAB — URINALYSIS, ROUTINE W REFLEX MICROSCOPIC
Bilirubin Urine: NEGATIVE
Glucose, UA: NEGATIVE mg/dL
Hgb urine dipstick: NEGATIVE
Ketones, ur: NEGATIVE mg/dL
Leukocytes, UA: NEGATIVE
Nitrite: NEGATIVE
Protein, ur: NEGATIVE mg/dL
Specific Gravity, Urine: 1.009 (ref 1.005–1.030)
Urobilinogen, UA: 0.2 mg/dL (ref 0.0–1.0)
pH: 6.5 (ref 5.0–8.0)

## 2014-03-24 LAB — CBC WITH DIFFERENTIAL/PLATELET
Basophils Absolute: 0 10*3/uL (ref 0.0–0.1)
Basophils Relative: 0 % (ref 0–1)
Eosinophils Absolute: 0 10*3/uL (ref 0.0–0.7)
Eosinophils Relative: 0 % (ref 0–5)
HCT: 41.9 % (ref 36.0–46.0)
Hemoglobin: 13.9 g/dL (ref 12.0–15.0)
Lymphocytes Relative: 22 % (ref 12–46)
Lymphs Abs: 2.6 10*3/uL (ref 0.7–4.0)
MCH: 32.1 pg (ref 26.0–34.0)
MCHC: 33.2 g/dL (ref 30.0–36.0)
MCV: 96.8 fL (ref 78.0–100.0)
Monocytes Absolute: 0.9 10*3/uL (ref 0.1–1.0)
Monocytes Relative: 7 % (ref 3–12)
Neutro Abs: 8.3 10*3/uL — ABNORMAL HIGH (ref 1.7–7.7)
Neutrophils Relative %: 71 % (ref 43–77)
Platelets: 239 10*3/uL (ref 150–400)
RBC: 4.33 MIL/uL (ref 3.87–5.11)
RDW: 12.4 % (ref 11.5–15.5)
WBC: 11.8 10*3/uL — ABNORMAL HIGH (ref 4.0–10.5)

## 2014-03-24 LAB — PREGNANCY, URINE: Preg Test, Ur: NEGATIVE

## 2014-03-24 MED ORDER — KETOROLAC TROMETHAMINE 30 MG/ML IJ SOLN
30.0000 mg | Freq: Once | INTRAMUSCULAR | Status: AC
  Administered 2014-03-24: 30 mg via INTRAVENOUS
  Filled 2014-03-24: qty 1

## 2014-03-24 MED ORDER — DIPHENHYDRAMINE HCL 50 MG/ML IJ SOLN
25.0000 mg | Freq: Once | INTRAMUSCULAR | Status: AC
  Administered 2014-03-24: 25 mg via INTRAVENOUS
  Filled 2014-03-24: qty 1

## 2014-03-24 MED ORDER — SODIUM CHLORIDE 0.9 % IV BOLUS (SEPSIS)
1000.0000 mL | Freq: Once | INTRAVENOUS | Status: AC
  Administered 2014-03-24: 1000 mL via INTRAVENOUS

## 2014-03-24 MED ORDER — METOCLOPRAMIDE HCL 5 MG/ML IJ SOLN
10.0000 mg | Freq: Once | INTRAMUSCULAR | Status: AC
  Administered 2014-03-24: 10 mg via INTRAVENOUS
  Filled 2014-03-24: qty 2

## 2014-03-24 NOTE — Discharge Instructions (Signed)

## 2014-03-24 NOTE — ED Notes (Signed)
Pt reports having severe headache and sensitivity to light for days. Denies n/v.

## 2014-03-24 NOTE — ED Provider Notes (Signed)
CSN: [id number]     Arrival date & time 03/24/14  [id number] History   First MD Initiated Contact with Patient 03/24/14 [phone]     Chief Complaint  Patient presents with  . Headache     (Consider location/radiation/quality/duration/timing/severity/associated sxs/prior Treatment) Patient is a 23 y.o. female presenting with headaches. The history is provided by the patient.  Headache Pain location:  Generalized Quality:  Sharp Radiates to:  Does not radiate Severity currently:  8/10 Severity at highest:  8/10 Duration:  2 days Timing:  Constant Progression:  Worsening Chronicity:  New Similar to prior headaches: no   Context: not activity   Relieved by:  Nothing Worsened by:  Nothing tried Ineffective treatments:  None tried Associated symptoms: myalgias and photophobia   Associated symptoms: no seizures and no vomiting    Pt reports decreased appetite.  Pt has not eaten since yesterday.   Past Medical History  Diagnosis Date  . Trichomonas    Past Surgical History  Procedure Laterality Date  . Laparoscopy  06/23/2011    Procedure: LAPAROSCOPY OPERATIVE;  Surgeon: Tereso NewcomerUgonna A Anyanwu, MD;  Location: [facility];  Service: Gynecology;  Laterality: Left;  Diagnostic laparoscopy  . Laparotomy  06/23/2011    Procedure: LAPAROTOMY;  Surgeon: Tereso NewcomerUgonna A Anyanwu, MD;  Location: [facility];  Service: Gynecology;  Laterality: Left;  Wedge resection of Ruptured left cornual ectopic pregnancy with left salpingectomy   Family History  Problem Relation Age of Onset  . Breast cancer Mother    History  Substance Use Topics  . Smoking status: Current Some Day Smoker  . Smokeless tobacco: Never Used  . Alcohol Use: No   OB History    Gravida Para Term Preterm AB TAB SAB Ectopic Multiple Living   2    1  1         Review of Systems  Eyes: Positive for photophobia.  Gastrointestinal: Negative for vomiting.  Musculoskeletal: Positive for myalgias.  Neurological: Positive for headaches. Negative for  seizures.  All other systems reviewed and are negative.     Allergies  Review of patient's allergies indicates no known allergies.  Home Medications   Prior to Admission medications   Medication Sig Start Date End Date Taking? Authorizing Provider  etonogestrel (NEXPLANON) 68 MG IMPL implant 1 each by Subdermal route once.   Yes Historical Provider, MD   BP 93/59 mmHg  Pulse 90  Temp(Src) 99.9 F (37.7 C) (Oral)  Resp 16  Ht 5\' 3"  (1.6 m)  Wt 189 lb (85.73 kg)  BMI 33.49 kg/m2  SpO2 100%  LMP 03/24/2014 Physical Exam  Constitutional: She is oriented to person, place, and time. She appears well-developed.  HENT:  Head: Normocephalic and atraumatic.  Right Ear: External ear normal.  Left Ear: External ear normal.  Nose: Nose normal.  Mouth/Throat: Oropharynx is clear and moist.  Eyes: Conjunctivae and EOM are normal. Pupils are equal, round, and reactive to light.  Neck: Normal range of motion.  Cardiovascular: Normal rate, regular rhythm and normal heart sounds.   Pulmonary/Chest: Effort normal and breath sounds normal.  Musculoskeletal: Normal range of motion.  Neurological: She is alert and oriented to person, place, and time.  Skin: Skin is warm.  Psychiatric: She has a normal mood and affect.  Nursing note and vitals reviewed.   ED Course  Procedures (including critical care time) Labs Review Labs Reviewed - No data to display  Imaging Review No results found.   EKG Interpretation None  Results for orders placed or performed during the hospital encounter of 03/24/14  CBC with Differential  Result Value Ref Range   WBC 11.8 (H) 4.0 - 10.5 K/uL   RBC 4.33 3.87 - 5.11 MIL/uL   Hemoglobin 13.9 12.0 - 15.0 g/dL   HCT 40.941.9 81.136.0 - 91.446.0 %   MCV 96.8 78.0 - 100.0 fL   MCH 32.1 26.0 - 34.0 pg   MCHC 33.2 30.0 - 36.0 g/dL   RDW 78.212.4 95.611.5 - 21.315.5 %   Platelets 239 150 - 400 K/uL   Neutrophils Relative % 71 43 - 77 %   Neutro Abs 8.3 (H) 1.7 - 7.7 K/uL    Lymphocytes Relative 22 12 - 46 %   Lymphs Abs 2.6 0.7 - 4.0 K/uL   Monocytes Relative 7 3 - 12 %   Monocytes Absolute 0.9 0.1 - 1.0 K/uL   Eosinophils Relative 0 0 - 5 %   Eosinophils Absolute 0.0 0.0 - 0.7 K/uL   Basophils Relative 0 0 - 1 %   Basophils Absolute 0.0 0.0 - 0.1 K/uL  Comprehensive metabolic panel  Result Value Ref Range   Sodium 141 137 - 147 mEq/L   Potassium 3.9 3.7 - 5.3 mEq/L   Chloride 105 96 - 112 mEq/L   CO2 24 19 - 32 mEq/L   Glucose, Bld 92 70 - 99 mg/dL   BUN 7 6 - 23 mg/dL   Creatinine, Ser 0.860.64 0.50 - 1.10 mg/dL   Calcium 8.8 8.4 - 57.810.5 mg/dL   Total Protein 6.9 6.0 - 8.3 g/dL   Albumin 3.4 (L) 3.5 - 5.2 g/dL   AST 10 0 - 37 U/L   ALT 11 0 - 35 U/L   Alkaline Phosphatase 58 39 - 117 U/L   Total Bilirubin 0.4 0.3 - 1.2 mg/dL   GFR calc non Af Amer >90 >90 mL/min   GFR calc Af Amer >90 >90 mL/min   Anion gap 12 5 - 15  Pregnancy, urine  Result Value Ref Range   Preg Test, Ur NEGATIVE NEGATIVE  Urinalysis, Routine w reflex microscopic  Result Value Ref Range   Color, Urine YELLOW YELLOW   APPearance CLEAR CLEAR   Specific Gravity, Urine 1.009 1.005 - 1.030   pH 6.5 5.0 - 8.0   Glucose, UA NEGATIVE NEGATIVE mg/dL   Hgb urine dipstick NEGATIVE NEGATIVE   Bilirubin Urine NEGATIVE NEGATIVE   Ketones, ur NEGATIVE NEGATIVE mg/dL   Protein, ur NEGATIVE NEGATIVE mg/dL   Urobilinogen, UA 0.2 0.0 - 1.0 mg/dL   Nitrite NEGATIVE NEGATIVE   Leukocytes, UA NEGATIVE NEGATIVE   No results found.   MDM  Pt given Iv fluids and migraine cocktail.   Pt feels better.  Pt's blood pressure decreased labs reviewed.  preg negative  Wbc's 11.8   Final diagnoses:  Migraine without aura and without status migrainosus, not intractable        Elson AreasLeslie K Pharrah Rottman, PA-C 03/24/14 1357  Vanetta MuldersScott Zackowski, MD 03/25/14 [phone]

## 2014-03-24 NOTE — ED Notes (Signed)
Pt comfortable with discharge and follow up instructions. Pt declines wheelchair, escorted to waiting area by this RN. No prescriptions. 

## 2014-12-29 ENCOUNTER — Ambulatory Visit (INDEPENDENT_AMBULATORY_CARE_PROVIDER_SITE_OTHER): Payer: Medicaid Other | Admitting: Obstetrics & Gynecology

## 2014-12-29 ENCOUNTER — Encounter: Payer: Self-pay | Admitting: Obstetrics & Gynecology

## 2014-12-29 VITALS — BP 122/69 | HR 85 | Temp 99.2°F | Ht 63.0 in | Wt 190.0 lb

## 2014-12-29 DIAGNOSIS — Z118 Encounter for screening for other infectious and parasitic diseases: Secondary | ICD-10-CM

## 2014-12-29 DIAGNOSIS — Z113 Encounter for screening for infections with a predominantly sexual mode of transmission: Secondary | ICD-10-CM

## 2014-12-29 DIAGNOSIS — Z3049 Encounter for surveillance of other contraceptives: Secondary | ICD-10-CM | POA: Diagnosis present

## 2014-12-29 DIAGNOSIS — Z30017 Encounter for initial prescription of implantable subdermal contraceptive: Secondary | ICD-10-CM

## 2014-12-29 MED ORDER — ETONOGESTREL 68 MG ~~LOC~~ IMPL
68.0000 mg | DRUG_IMPLANT | Freq: Once | SUBCUTANEOUS | Status: AC
  Administered 2014-12-29: 68 mg via SUBCUTANEOUS

## 2014-12-29 NOTE — Progress Notes (Signed)
Subjective: Patient presents for nexplanon removal and re-insertion. Also wishes to be screened for STDs. No other complaints.   Objective: Blood pressure 122/69, pulse 85, temperature 99.2 F (37.3 C), height  (1.6 m), weight 190 lb (86.183 kg), last menstrual period 12/02/2014. Gen: 24 yo female in NAD sitting on exam bed Pulm: NWOB Skin: Prior incisional site on left arm noted. No other rashes Neuro: Alert, no focal neurological deficits Psych: Appropriate mood and thought content.  Procedure:  Nexplanon Removal Patient given informed consent for removal of her Nexplanon, time out was performed.  Signed copy in the chart.  Appropriate time out taken. Implanon site identified.  Area prepped in usual sterile fashon. One cc of 1% lidocaine was used to anesthetize the area at the distal end of the implant. A small stab incision was made right beside the implant on the distal portion.  The nexplanon rod was grasped using hemostats and removed without difficulty.   Nexplanon Insertion Appropriate time out taken.  Patient's left arm was prepped and draped in the usual sterile fashion. Pt was injected with 5 cc of 1% lidocaine with epinephrine.  Pt was prepped with betadine, Nexplanon removed form packaging,  Device confirmed in needle, then inserted full length of needle and withdrawn per handbook instructions.  Pt insertion site covered with steristrips.   Minimal blood loss.   The patient tolerated the procedure well and was given post procedure instruction  Assessment: 1) Contraception Management 2) Screening for STDs  Plan: 1) Nexplanon removed and re-inserted today. Due for removal in 3 years.  2) Will obtain urine GC/CT, HIV, and RPR today per patient request.   Katina Degree. Jimmey Ralph, MD [facility] Family Medicine Resident PGY-2 12/29/2014 3:59 PM    Attestation of Attending Supervision of Resident: Evaluation and management procedures were performed by the [facility] Medicine  Resident under my supervision.  I have seen and examined the patient, reviewed the resident's note and chart, was present for procedures, and I agree with the management and plan.  Jaynie Collins, MD, FACOG Attending Obstetrician & Gynecologist Faculty Practice, [facility]

## 2014-12-30 LAB — RPR

## 2014-12-30 LAB — HEPATITIS C ANTIBODY: HCV Ab: NEGATIVE

## 2014-12-30 LAB — HIV ANTIBODY (ROUTINE TESTING W REFLEX): HIV 1&2 Ab, 4th Generation: NONREACTIVE

## 2015-02-16 ENCOUNTER — Encounter (HOSPITAL_COMMUNITY): Payer: Self-pay | Admitting: *Deleted

## 2015-02-16 ENCOUNTER — Inpatient Hospital Stay (HOSPITAL_COMMUNITY)
Admission: AD | Admit: 2015-02-16 | Discharge: 2015-02-16 | Disposition: A | Discharge status: Home / Self Care | Payer: Self-pay | Source: Ambulatory Visit | Attending: Obstetrics & Gynecology | Admitting: Obstetrics & Gynecology

## 2015-02-16 DIAGNOSIS — N921 Excessive and frequent menstruation with irregular cycle: Secondary | ICD-10-CM

## 2015-02-16 DIAGNOSIS — Z975 Presence of (intrauterine) contraceptive device: Secondary | ICD-10-CM

## 2015-02-16 DIAGNOSIS — N926 Irregular menstruation, unspecified: Secondary | ICD-10-CM | POA: Insufficient documentation

## 2015-02-16 DIAGNOSIS — F17200 Nicotine dependence, unspecified, uncomplicated: Secondary | ICD-10-CM | POA: Insufficient documentation

## 2015-02-16 HISTORY — DX: Other specified health status: Z78.9

## 2015-02-16 LAB — CBC
HCT: 41.5 % (ref 36.0–46.0)
Hemoglobin: 13.9 g/dL (ref 12.0–15.0)
MCH: 32 pg (ref 26.0–34.0)
MCHC: 33.5 g/dL (ref 30.0–36.0)
MCV: 95.4 fL (ref 78.0–100.0)
PLATELETS: 262 10*3/uL (ref 150–400)
RBC: 4.35 MIL/uL (ref 3.87–5.11)
RDW: 12.7 % (ref 11.5–15.5)
WBC: 13 10*3/uL — ABNORMAL HIGH (ref 4.0–10.5)

## 2015-02-16 LAB — POCT PREGNANCY, URINE: PREG TEST UR: NEGATIVE

## 2015-02-16 LAB — URINE MICROSCOPIC-ADD ON

## 2015-02-16 LAB — URINALYSIS, ROUTINE W REFLEX MICROSCOPIC
BILIRUBIN URINE: NEGATIVE
GLUCOSE, UA: NEGATIVE mg/dL
Ketones, ur: 15 mg/dL — AB
Leukocytes, UA: NEGATIVE
Nitrite: NEGATIVE
PH: 6 (ref 5.0–8.0)
Protein, ur: NEGATIVE mg/dL
Specific Gravity, Urine: >1.03 — ABNORMAL HIGH (ref 1.005–1.030)
Urobilinogen, UA: 1 mg/dL (ref 0.0–1.0)

## 2015-02-16 MED ORDER — MEGESTROL ACETATE 20 MG PO TABS: 40.0000 mg | ORAL_TABLET | Freq: Two times a day (BID) | ORAL | Status: DC

## 2015-02-16 NOTE — MAU Provider Note (Signed)
History     CSN: [id number]  Arrival date and time: 02/16/15 [id number]   First Provider Initiated Contact with Patient 02/16/15 0100      No chief complaint on file.  HPI Brittany Quinn is a 24 y.o. G2P0020 who presents to MAU today with complaint of vaginal bleeding x 1 month. She states that bleeding has been consistent since onset and is heavy. She states that she is soaking ~ 4 pads per day. She denies pain. She was [facility] in [facility] in September and had Nexplanon removed and a new Nexplanon inserted on the same day. She states that with her last Nexplanon she had no bleeding x 2 years and then regular periods for the last year. She states some occasional mild weakness, but denies dizziness or fatigue. She also denies abdominal pain.   OB History    Gravida Para Term Preterm AB TAB SAB Ectopic Multiple Living   2    2  1 1         Past Medical History  Diagnosis Date  . Trichomonas   . Medical history non-contributory     Past Surgical History  Procedure Laterality Date  . Laparoscopy  06/23/2011    Procedure: LAPAROSCOPY OPERATIVE;  Surgeon: Tereso NewcomerUgonna A Anyanwu, MD;  Location: [facility];  Service: Gynecology;  Laterality: Left;  Diagnostic laparoscopy  . Laparotomy  06/23/2011    Procedure: LAPAROTOMY;  Surgeon: Tereso NewcomerUgonna A Anyanwu, MD;  Location: [facility];  Service: Gynecology;  Laterality: Left;  Wedge resection of Ruptured left cornual ectopic pregnancy with left salpingectomy    Family History  Problem Relation Age of Onset  . Breast cancer Mother     Social History  Substance Use Topics  . Smoking status: Current Some Day Smoker  . Smokeless tobacco: Never Used  . Alcohol Use: No    Allergies: No Known Allergies  Prescriptions prior to admission  Medication Sig Dispense Refill Last Dose  . etonogestrel (NEXPLANON) 68 MG IMPL implant 1 each by Subdermal route once.    Taking    Review of Systems  Constitutional: Negative for fever and malaise/fatigue.  Gastrointestinal:  Negative for nausea, vomiting, abdominal pain, diarrhea and constipation.  Genitourinary: Negative for dysuria, urgency and frequency.       + vaginal bleeding  Neurological: Positive for weakness. Negative for dizziness.   Physical Exam   Blood pressure 126/70, pulse 95, temperature 99.4 F (37.4 C), temperature source Oral, resp. rate 18, height 5\' 2"  (1.575 m), weight 188 lb (85.276 kg), last menstrual period 12/29/2014.  Physical Exam  Nursing note and vitals reviewed. Constitutional: She is oriented to person, place, and time. She appears well-developed and well-nourished. No distress.  HENT:  Head: Normocephalic and atraumatic.  Cardiovascular: Normal rate.   Respiratory: Effort normal.  GI: Soft. She exhibits no distension and no mass. There is no tenderness. There is no rebound and no guarding.  Genitourinary: Uterus is not enlarged and not tender. Cervix exhibits no motion tenderness, no discharge and no friability. Right adnexum displays no mass and no tenderness. Left adnexum displays no mass and no tenderness. There is bleeding (scant blood in vaginal vault, mostly brown, some red) in the vagina. No vaginal discharge found.  Neurological: She is alert and oriented to person, place, and time.  Skin: Skin is warm and dry. No erythema.  Psychiatric: She has a normal mood and affect.   Results for orders placed or performed during the hospital encounter of 02/16/15 (from the past  24 hour(s))  CBC     Status: Abnormal   Collection Time: 02/16/15 12:45 AM  Result Value Ref Range   WBC 13.0 (H) 4.0 - 10.5 K/uL   RBC 4.35 3.87 - 5.11 MIL/uL   Hemoglobin 13.9 12.0 - 15.0 g/dL   HCT 16.1 09.6 - 04.5 %   MCV 95.4 78.0 - 100.0 fL   MCH 32.0 26.0 - 34.0 pg   MCHC 33.5 30.0 - 36.0 g/dL   RDW 40.9 81.1 - 91.4 %   Platelets 262 150 - 400 K/uL  Pregnancy, urine POC     Status: None   Collection Time: 02/16/15 12:45 AM  Result Value Ref Range   Preg Test, Ur NEGATIVE NEGATIVE     MAU Course  Procedures None  MDM UPT - negative CBC today Patient is hemodynamically stable today  Assessment and Plan  A: Irregular bleeding with Nexplanon  P: Discharge home Rx for Megace given to patient  Bleeding precautions discussed Patient advised to follow-up with [facility] if bleeding profile remains undesirable 6 months after insertion to discuss change in birth control Patient may return to MAU as needed or if her condition were to change or worsen   Marny Lowenstein, PA-C  02/16/2015, 1:00 AM

## 2015-02-16 NOTE — MAU Note (Signed)
PT  SAYS  HER LMP  WAS 12-29-2014   -   X9 DAYS      AND  WAS SEEN   AT  CLINIC   FOR EXPLANON  INSERTION.        THEN  STARTED  BLEEDING  AGAIN ON 10-1    AND HAS NOT STOPPED-  AND HAS BEEN HEAVY.         IN TRIAGE  - PAD ON  SMALL AMT  DARK  BROWN   .       NO PAIN  OR  CRAMPS

## 2015-02-16 NOTE — Discharge Instructions (Signed)
Contraception Choices Contraception (birth control) is the use of any methods or devices to prevent pregnancy. Below are some methods to help avoid pregnancy. HORMONAL METHODS   Contraceptive implant. This is a thin, plastic tube containing progesterone hormone. It does not contain estrogen hormone. Your health care provider inserts the tube in the inner part of the upper arm. The tube can remain in place for up to 3 years. After 3 years, the implant must be removed. The implant prevents the ovaries from releasing an egg (ovulation), thickens the cervical mucus to prevent sperm from entering the uterus, and thins the lining of the inside of the uterus.  Progesterone-only injections. These injections are given every 3 months by your health care provider to prevent pregnancy. This synthetic progesterone hormone stops the ovaries from releasing eggs. It also thickens cervical mucus and changes the uterine lining. This makes it harder for sperm to survive in the uterus.  Birth control pills. These pills contain estrogen and progesterone hormone. They work by preventing the ovaries from releasing eggs (ovulation). They also cause the cervical mucus to thicken, preventing the sperm from entering the uterus. Birth control pills are prescribed by a health care provider.Birth control pills can also be used to treat heavy periods.  Minipill. This type of birth control pill contains only the progesterone hormone. They are taken every day of each month and must be prescribed by your health care provider.  Birth control patch. The patch contains hormones similar to those in birth control pills. It must be changed once a week and is prescribed by a health care provider.  Vaginal ring. The ring contains hormones similar to those in birth control pills. It is left in the vagina for 3 weeks, removed for 1 week, and then a new one is put back in place. The patient must be comfortable inserting and removing the ring  from the vagina.A health care provider's prescription is necessary.  Emergency contraception. Emergency contraceptives prevent pregnancy after unprotected sexual intercourse. This pill can be taken right after sex or up to 5 days after unprotected sex. It is most effective the sooner you take the pills after having sexual intercourse. Most emergency contraceptive pills are available without a prescription. Check with your pharmacist. Do not use emergency contraception as your only form of birth control. BARRIER METHODS   Female condom. This is a thin sheath (latex or rubber) that is worn over the penis during sexual intercourse. It can be used with spermicide to increase effectiveness.  Female condom. This is a soft, loose-fitting sheath that is put into the vagina before sexual intercourse.  Diaphragm. This is a soft, latex, dome-shaped barrier that must be fitted by a health care provider. It is inserted into the vagina, along with a spermicidal jelly. It is inserted before intercourse. The diaphragm should be left in the vagina for 6 to 8 hours after intercourse.  Cervical cap. This is a round, soft, latex or plastic cup that fits over the cervix and must be fitted by a health care provider. The cap can be left in place for up to 48 hours after intercourse.  Sponge. This is a soft, circular piece of polyurethane foam. The sponge has spermicide in it. It is inserted into the vagina after wetting it and before sexual intercourse.  Spermicides. These are chemicals that kill or block sperm from entering the cervix and uterus. They come in the form of creams, jellies, suppositories, foam, or tablets. They do not require a  prescription. They are inserted into the vagina with an applicator before having sexual intercourse. The process must be repeated every time you have sexual intercourse. INTRAUTERINE CONTRACEPTION  Intrauterine device (IUD). This is a T-shaped device that is put in a woman's uterus  during a menstrual period to prevent pregnancy. There are 2 types:  Copper IUD. This type of IUD is wrapped in copper wire and is placed inside the uterus. Copper makes the uterus and fallopian tubes produce a fluid that kills sperm. It can stay in place for 10 years.  Hormone IUD. This type of IUD contains the hormone progestin (synthetic progesterone). The hormone thickens the cervical mucus and prevents sperm from entering the uterus, and it also thins the uterine lining to prevent implantation of a fertilized egg. The hormone can weaken or kill the sperm that get into the uterus. It can stay in place for 3-5 years, depending on which type of IUD is used. PERMANENT METHODS OF CONTRACEPTION  Female tubal ligation. This is when the woman's fallopian tubes are surgically sealed, tied, or blocked to prevent the egg from traveling to the uterus.  Hysteroscopic sterilization. This involves placing a small coil or insert into each fallopian tube. Your doctor uses a technique called hysteroscopy to do the procedure. The device causes scar tissue to form. This results in permanent blockage of the fallopian tubes, so the sperm cannot fertilize the egg. It takes about 3 months after the procedure for the tubes to become blocked. You must use another form of birth control for these 3 months.  Female sterilization. This is when the female has the tubes that carry sperm tied off (vasectomy).This blocks sperm from entering the vagina during sexual intercourse. After the procedure, the man can still ejaculate fluid (semen). NATURAL PLANNING METHODS  Natural family planning. This is not having sexual intercourse or using a barrier method (condom, diaphragm, cervical cap) on days the woman could become pregnant.  Calendar method. This is keeping track of the length of each menstrual cycle and identifying when you are fertile.  Ovulation method. This is avoiding sexual intercourse during ovulation.  Symptothermal  method. This is avoiding sexual intercourse during ovulation, using a thermometer and ovulation symptoms.  Post-ovulation method. This is timing sexual intercourse after you have ovulated. Regardless of which type or method of contraception you choose, it is important that you use condoms to protect against the transmission of sexually transmitted infections (STIs). Talk with your health care provider about which form of contraception is most appropriate for you.   This information is not intended to replace advice given to you by your health care provider. Make sure you discuss any questions you have with your health care provider.   Document Released: 04/02/2005 Document Revised: 04/07/2013 Document Reviewed: 09/25/2012 Elsevier Interactive Patient Education 2016 [employer]  Abnormal Uterine Bleeding Abnormal uterine bleeding means bleeding from the vagina that is not your normal menstrual period. This can be:  Bleeding or spotting between periods.  Bleeding after sex (sexual intercourse).  Bleeding that is heavier or more than normal.  Periods that last longer than usual.  Bleeding after menopause. There are many problems that may cause this. Treatment will depend on the cause of the bleeding. Any kind of bleeding that is not normal should be reviewed by your doctor.  HOME CARE Watch your condition for any changes. These actions may lessen any discomfort you are having:  Do not use tampons or douches as told by your doctor.  Change your pads often. You should get regular pelvic exams and Pap tests. Keep all appointments for tests as told by your doctor. GET HELP IF:  You are bleeding for more than 1 week.  You feel dizzy at times. GET HELP RIGHT AWAY IF:   You pass out.  You have to change pads every 15 to 30 minutes.  You have belly pain.  You have a fever.  You become sweaty or weak.  You are passing large blood clots from the vagina.  You feel sick to your  stomach (nauseous) and throw up (vomit). MAKE SURE YOU:  Understand these instructions.  Will watch your condition.  Will get help right away if you are not doing well or get worse.   This information is not intended to replace advice given to you by your health care provider. Make sure you discuss any questions you have with your health care provider.   Document Released: 01/28/2009 Document Revised: 04/07/2013 Document Reviewed: 10/30/2012 Elsevier Interactive Patient Education [employer]

## 2016-06-12 ENCOUNTER — Encounter (HOSPITAL_COMMUNITY): Payer: Self-pay | Admitting: Emergency Medicine

## 2016-06-12 ENCOUNTER — Emergency Department (HOSPITAL_COMMUNITY)
Admission: EM | Admit: 2016-06-12 | Discharge: 2016-06-12 | Disposition: A | Discharge status: Home / Self Care | Payer: Commercial Managed Care - PPO | Attending: Emergency Medicine | Admitting: Emergency Medicine

## 2016-06-12 DIAGNOSIS — F17200 Nicotine dependence, unspecified, uncomplicated: Secondary | ICD-10-CM | POA: Insufficient documentation

## 2016-06-12 DIAGNOSIS — J111 Influenza due to unidentified influenza virus with other respiratory manifestations: Secondary | ICD-10-CM | POA: Diagnosis not present

## 2016-06-12 DIAGNOSIS — R69 Illness, unspecified: Secondary | ICD-10-CM

## 2016-06-12 DIAGNOSIS — R05 Cough: Secondary | ICD-10-CM | POA: Diagnosis present

## 2016-06-12 LAB — CBC
HCT: 42.9 % (ref 36.0–46.0)
HEMOGLOBIN: 14.4 g/dL (ref 12.0–15.0)
MCH: 31.6 pg (ref 26.0–34.0)
MCHC: 33.6 g/dL (ref 30.0–36.0)
MCV: 94.1 fL (ref 78.0–100.0)
Platelets: 216 10*3/uL (ref 150–400)
RBC: 4.56 MIL/uL (ref 3.87–5.11)
RDW: 12.6 % (ref 11.5–15.5)
WBC: 8.4 10*3/uL (ref 4.0–10.5)

## 2016-06-12 LAB — URINALYSIS, ROUTINE W REFLEX MICROSCOPIC
BACTERIA UA: NONE SEEN
Bilirubin Urine: NEGATIVE
GLUCOSE, UA: NEGATIVE mg/dL
KETONES UR: NEGATIVE mg/dL
Nitrite: NEGATIVE
PROTEIN: NEGATIVE mg/dL
Specific Gravity, Urine: 1.019 (ref 1.005–1.030)
pH: 7 (ref 5.0–8.0)

## 2016-06-12 LAB — COMPREHENSIVE METABOLIC PANEL
ALK PHOS: 56 U/L (ref 38–126)
ALT: 19 U/L (ref 14–54)
ANION GAP: 5 (ref 5–15)
AST: 16 U/L (ref 15–41)
Albumin: 4.1 g/dL (ref 3.5–5.0)
BUN: 7 mg/dL (ref 6–20)
CALCIUM: 8.6 mg/dL — AB (ref 8.9–10.3)
CO2: 25 mmol/L (ref 22–32)
Chloride: 106 mmol/L (ref 101–111)
Creatinine, Ser: 0.64 mg/dL (ref 0.44–1.00)
GFR calc non Af Amer: >60 mL/min (ref >60)
GLUCOSE: 95 mg/dL (ref 65–99)
POTASSIUM: 3.8 mmol/L (ref 3.5–5.1)
SODIUM: 136 mmol/L (ref 135–145)
Total Bilirubin: 0.5 mg/dL (ref 0.3–1.2)
Total Protein: 7.1 g/dL (ref 6.5–8.1)

## 2016-06-12 LAB — LIPASE, BLOOD: Lipase: 30 U/L (ref 11–51)

## 2016-06-12 NOTE — ED Triage Notes (Signed)
Patient c/o productive cough, body aches, chills, and vomiting x2 days. Patient reports she has been around people with the flu.

## 2016-06-12 NOTE — ED Notes (Signed)
ED Provider at bedside. 

## 2016-06-12 NOTE — ED Provider Notes (Signed)
[facility]-EMERGENCY DEPT Provider Note   CSN: [id number] Arrival date & time: 06/12/16  1110     History   Chief Complaint Chief Complaint  Patient presents with  . Emesis  . Generalized Body Aches    HPI Brittany Quinn is a 26 y.o. female.  The history is provided by the patient.  Fever   This is a new problem. The current episode started 1 to 2 hours ago. The problem has been gradually improving. The maximum temperature noted was 102 to 102.9 F. Associated symptoms include fussiness, vomiting, congestion, sore throat, muscle aches and cough.   Girl-friend had the flu last week. Pt took Tamiflu ppx.   Past Medical History:  Diagnosis Date  . Medical history non-contributory   . Trichomonas     Patient Active Problem List   Diagnosis Date Noted  . Nexplanon in place 08/20/2011    Past Surgical History:  Procedure Laterality Date  . LAPAROSCOPY  06/23/2011   Procedure: LAPAROSCOPY OPERATIVE;  Surgeon: Tereso NewcomerUgonna A Anyanwu, MD;  Location: [facility];  Service: Gynecology;  Laterality: Left;  Diagnostic laparoscopy  . LAPAROTOMY  06/23/2011   Procedure: LAPAROTOMY;  Surgeon: Tereso NewcomerUgonna A Anyanwu, MD;  Location: [facility];  Service: Gynecology;  Laterality: Left;  Wedge resection of Ruptured left cornual ectopic pregnancy with left salpingectomy    OB History    Gravida Para Term Preterm AB Living   2       2     SAB TAB Ectopic Multiple Live Births   1   1           Home Medications    Prior to Admission medications   Medication Sig Start Date End Date Taking? Authorizing Provider  etonogestrel (NEXPLANON) 68 MG IMPL implant 1 each by Subdermal route once.     Historical Provider, MD  megestrol (MEGACE) 20 MG tablet Take 2 tablets (40 mg total) by mouth 2 (two) times daily. 02/16/15   Marny LowensteinJulie N Wenzel, PA-C    Family History Family History  Problem Relation Age of Onset  . Breast cancer Mother     Social History Social History  Substance Use Topics  . Smoking status: Current  Some Day Smoker  . Smokeless tobacco: Never Used  . Alcohol use No     Allergies   Patient has no known allergies.   Review of Systems Review of Systems  Constitutional: Positive for fever.  HENT: Positive for congestion and sore throat.   Respiratory: Positive for cough.   Gastrointestinal: Positive for vomiting.  Ten systems are reviewed and are negative for acute change except as noted in the HPI    Physical Exam Updated Vital Signs BP 131/76 (BP Location: Left Arm)   Pulse 77   Temp 100.2 F (37.9 C) (Oral)   Resp 12   Ht 5\' 4"  (1.626 m)   Wt 200 lb 8 oz (90.9 kg)   SpO2 100%   BMI 34.42 kg/m   Physical Exam  Constitutional: She is oriented to person, place, and time. She appears well-developed and well-nourished. No distress.  HENT:  Head: Normocephalic and atraumatic.  Nose: Nose normal.  Eyes: Conjunctivae and EOM are normal. Pupils are equal, round, and reactive to light. Right eye exhibits no discharge. Left eye exhibits no discharge. No scleral icterus.  Neck: Normal range of motion. Neck supple.  Cardiovascular: Normal rate and regular rhythm.  Exam reveals no gallop and no friction rub.   No murmur heard. Pulmonary/Chest: Effort normal  and breath sounds normal. No stridor. No respiratory distress. She has no rales.  Abdominal: Soft. She exhibits no distension. There is no tenderness.  Musculoskeletal: She exhibits no edema or tenderness.  Neurological: She is alert and oriented to person, place, and time.  Skin: Skin is warm and dry. No rash noted. She is not diaphoretic. No erythema.  Psychiatric: She has a normal mood and affect.  Vitals reviewed.    ED Treatments / Results  Labs (all labs ordered are listed, but only abnormal results are displayed) Labs Reviewed  COMPREHENSIVE METABOLIC PANEL - Abnormal; Notable for the following:       Result Value   Calcium 8.6 (*)    All other components within normal limits  URINALYSIS, ROUTINE W REFLEX  MICROSCOPIC - Abnormal; Notable for the following:    Hgb urine dipstick SMALL (*)    Leukocytes, UA TRACE (*)    Squamous Epithelial / LPF 0-5 (*)    All other components within normal limits  LIPASE, BLOOD  CBC    EKG  EKG Interpretation None       Radiology No results found.  Procedures Procedures (including critical care time)  Medications Ordered in ED Medications - No data to display   Initial Impression / Assessment and Plan / ED Course  I have reviewed the triage vital signs and the nursing notes.  Pertinent labs & imaging results that were available during my care of the patient were reviewed by me and considered in my medical decision making (see chart for details).     26 y.o. female presents with flu-like symptoms for 1 day. adequate oral hydration. Rest of history as above.  Patient appears well. No signs of toxicity, patient is interactive. No hypoxia, tachypnea or other signs of respiratory distress. No sign of clinical dehydration. Lung exam clear. Rest of exam as above.  Most consistent with flu-like illness   No evidence suggestive of pharyngitis, AOM, PNA, or meningitis.  Chest x-ray not indicated at this time.  Discussed Tamiflu. Pt declined.  Discussed symptomatic treatment with the patient and they will follow closely with their PCP.    Final Clinical Impressions(s) / ED Diagnoses   Final diagnoses:  Influenza-like illness   Disposition: Discharge  Condition: Good  I have discussed the results, Dx and Tx plan with the patient who expressed understanding and agree(s) with the plan. Discharge instructions discussed at great length. The patient was given strict return precautions who verbalized understanding of the instructions. No further questions at time of discharge.    New Prescriptions   No medications on file    Follow Up: primary care provider         Nira Conn, MD 06/12/16 1316

## 2017-01-28 ENCOUNTER — Ambulatory Visit (INDEPENDENT_AMBULATORY_CARE_PROVIDER_SITE_OTHER): Payer: Medicaid Other | Admitting: Obstetrics & Gynecology

## 2017-01-28 ENCOUNTER — Other Ambulatory Visit (HOSPITAL_COMMUNITY)
Admission: RE | Admit: 2017-01-28 | Discharge: 2017-01-28 | Disposition: A | Discharge status: Home / Self Care | Payer: Medicaid Other | Source: Ambulatory Visit | Attending: Obstetrics & Gynecology | Admitting: Obstetrics & Gynecology

## 2017-01-28 ENCOUNTER — Encounter: Payer: Self-pay | Admitting: Obstetrics & Gynecology

## 2017-01-28 VITALS — BP 129/66 | HR 85 | Ht 64.0 in | Wt 188.8 lb

## 2017-01-28 DIAGNOSIS — Z01419 Encounter for gynecological examination (general) (routine) without abnormal findings: Secondary | ICD-10-CM | POA: Insufficient documentation

## 2017-01-28 DIAGNOSIS — Z3049 Encounter for surveillance of other contraceptives: Secondary | ICD-10-CM | POA: Diagnosis not present

## 2017-01-28 DIAGNOSIS — Z809 Family history of malignant neoplasm, unspecified: Secondary | ICD-10-CM

## 2017-01-28 NOTE — Progress Notes (Addendum)
Subjective:    Brittany Quinn is a 26 y.o. S AA P0  female who presents for an annual exam. The patient has no complaints today. The patient is sexually active. GYN screening history: last pap: was normal. The patient wears seatbelts: yes. The patient participates in regular exercise: no. Has the patient ever been transfused or tattooed?: yes. The patient reports that there is not domestic violence in her life.   Menstrual History: OB History    Gravida Para Term Preterm AB Living   2       2     SAB TAB Ectopic Multiple Live Births   1   1          Menarche age: 24 No LMP recorded (lmp unknown).    The following portions of the patient's history were reviewed and updated as appropriate: allergies, current medications, past family history, past medical history, past social history, past surgical history and problem list.  Review of Systems Pertinent items are noted in HPI.   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 She has a rare period  Objective:    BP 129/66   Pulse 85   Ht '5\' 4"'  (1.626 m)   Wt 188 lb 12.8 oz (85.6 kg)   LMP  (LMP Unknown)   BMI 32.41 kg/m   General Appearance:    Alert, cooperative, no distress, appears stated age  Head:    Normocephalic, without obvious abnormality, atraumatic  Eyes:    PERRL, conjunctiva/corneas clear, EOM's intact, fundi    benign, both eyes  Ears:    Normal TM's and external ear canals, both ears  Nose:   Nares normal, septum midline, mucosa normal, no drainage    or sinus tenderness  Throat:   Lips, mucosa, and tongue normal; teeth and gums normal  Neck:   Supple, symmetrical, trachea midline, no adenopathy;    thyroid:  no enlargement/tenderness/nodules; no carotid   bruit or JVD  Back:     Symmetric, no curvature, ROM normal, no CVA tenderness  Lungs:     Clear to auscultation bilaterally, respirations unlabored  Chest  Wall:    No tenderness or deformity   Heart:    Regular rate and rhythm, S1 and S2 normal, no murmur, rub   or gallop  Breast Exam:    No tenderness, masses, or nipple abnormality  Abdomen:     Soft, non-tender, bowel sounds active all four quadrants,    no masses, no organomegaly  Genitalia:    Normal female without lesion, discharge or tenderness, nulliparous cervix, NSSmid plane, no palpable adnexal masses, NT     Extremities:   Extremities normal, atraumatic, no cyanosis or edema  Pulses:   2+ and symmetric all extremities  Skin:   Skin color, texture, turgor normal, no rashes or lesions  Lymph nodes:   Cervical, supraclavicular, and axillary nodes normal  Neurologic:   CNII-XII intact, normal strength, sensation and reflexes    throughout  .    Assessment:    Healthy female exam.    Plan:     Thin prep Pap smear.   BRCA testing STI testing per patient request She declines a flu vaccine

## 2017-01-28 NOTE — Addendum Note (Signed)
Addended by: Faythe Casa on: 01/28/2017 12:13 PM   Modules accepted: Orders

## 2017-01-29 LAB — RPR: RPR: NONREACTIVE

## 2017-01-29 LAB — HEPATITIS C ANTIBODY

## 2017-01-29 LAB — HIV ANTIBODY (ROUTINE TESTING W REFLEX): HIV Screen 4th Generation wRfx: NONREACTIVE

## 2017-01-29 LAB — TSH: TSH: 0.993 u[IU]/mL (ref 0.450–4.500)

## 2017-01-29 LAB — HEPATITIS B SURFACE ANTIGEN: Hepatitis B Surface Ag: NEGATIVE

## 2017-01-31 LAB — CYTOLOGY - PAP
CHLAMYDIA, DNA PROBE: NEGATIVE
Diagnosis: NEGATIVE
Neisseria Gonorrhea: NEGATIVE

## 2017-02-13 ENCOUNTER — Encounter: Payer: Self-pay | Admitting: *Deleted

## 2017-02-21 ENCOUNTER — Encounter: Payer: Self-pay | Admitting: *Deleted

## 2017-03-14 ENCOUNTER — Emergency Department (HOSPITAL_COMMUNITY)
Admission: EM | Admit: 2017-03-14 | Discharge: 2017-03-14 | Disposition: A | Discharge status: Home / Self Care | Payer: No Typology Code available for payment source | Attending: Emergency Medicine | Admitting: Emergency Medicine

## 2017-03-14 ENCOUNTER — Encounter (HOSPITAL_COMMUNITY): Payer: Self-pay | Admitting: Emergency Medicine

## 2017-03-14 ENCOUNTER — Emergency Department (HOSPITAL_COMMUNITY): Payer: No Typology Code available for payment source

## 2017-03-14 DIAGNOSIS — F17200 Nicotine dependence, unspecified, uncomplicated: Secondary | ICD-10-CM | POA: Diagnosis not present

## 2017-03-14 DIAGNOSIS — R0781 Pleurodynia: Secondary | ICD-10-CM

## 2017-03-14 DIAGNOSIS — Z79899 Other long term (current) drug therapy: Secondary | ICD-10-CM | POA: Diagnosis not present

## 2017-03-14 DIAGNOSIS — R0789 Other chest pain: Secondary | ICD-10-CM | POA: Diagnosis not present

## 2017-03-14 DIAGNOSIS — M791 Myalgia, unspecified site: Secondary | ICD-10-CM | POA: Diagnosis not present

## 2017-03-14 DIAGNOSIS — Z041 Encounter for examination and observation following transport accident: Secondary | ICD-10-CM | POA: Insufficient documentation

## 2017-03-14 DIAGNOSIS — M79602 Pain in left arm: Secondary | ICD-10-CM | POA: Insufficient documentation

## 2017-03-14 MED ORDER — HYDROCODONE-ACETAMINOPHEN 5-325 MG PO TABS: 1.0000 | ORAL_TABLET | Freq: Four times a day (QID) | ORAL | 0 refills | Status: DC | PRN

## 2017-03-14 MED ORDER — HYDROCODONE-ACETAMINOPHEN 5-325 MG PO TABS
1.0000 | ORAL_TABLET | Freq: Once | ORAL | Status: AC
Start: 2017-03-14 — End: 2017-03-14
  Administered 2017-03-14: 1 via ORAL
  Filled 2017-03-14: qty 1

## 2017-03-14 MED ORDER — NAPROXEN 375 MG PO TABS: 375.0000 mg | ORAL_TABLET | Freq: Two times a day (BID) | ORAL | 0 refills | Status: DC

## 2017-03-14 NOTE — ED Notes (Signed)
Pt ambulatory and independent at discharge.  Verbalized understanding of discharge instructions 
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
## 2017-03-14 NOTE — Discharge Instructions (Addendum)
Your imaging has been reassuring.  No signs of fractured bones.  Have given you an incentive spirometer to use 2-3 times every hour while you are awake to prevent pneumonia.  Have also given you naproxen to take around the clock which is an anti-inflammatory. Please take the Naproxen as prescribed for pain. Do not take any additional NSAIDs including Motrin, Aleve, Ibuprofen, Advil.  I also given you a short course of pain medicine.  Please take this only for pain that is not controlled by the NSAIDs.  Warm compresses to the areas of painl.  Warm soaks and Epsom salt.
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
## 2017-06-27 ENCOUNTER — Encounter (HOSPITAL_BASED_OUTPATIENT_CLINIC_OR_DEPARTMENT_OTHER): Payer: Self-pay

## 2017-06-27 ENCOUNTER — Other Ambulatory Visit: Payer: Self-pay

## 2017-06-27 ENCOUNTER — Emergency Department (HOSPITAL_BASED_OUTPATIENT_CLINIC_OR_DEPARTMENT_OTHER)
Admission: EM | Admit: 2017-06-27 | Discharge: 2017-06-27 | Disposition: A | Discharge status: Home / Self Care | Payer: Medicaid Other | Attending: Emergency Medicine | Admitting: Emergency Medicine

## 2017-06-27 DIAGNOSIS — R519 Headache, unspecified: Secondary | ICD-10-CM

## 2017-06-27 DIAGNOSIS — R51 Headache: Secondary | ICD-10-CM

## 2017-06-27 DIAGNOSIS — G43809 Other migraine, not intractable, without status migrainosus: Secondary | ICD-10-CM | POA: Insufficient documentation

## 2017-06-27 DIAGNOSIS — F17290 Nicotine dependence, other tobacco product, uncomplicated: Secondary | ICD-10-CM | POA: Insufficient documentation

## 2017-06-27 MED ORDER — DEXAMETHASONE SODIUM PHOSPHATE 10 MG/ML IJ SOLN
10.0000 mg | Freq: Once | INTRAMUSCULAR | Status: AC
  Administered 2017-06-27: 10 mg via INTRAVENOUS
  Filled 2017-06-27: qty 1

## 2017-06-27 MED ORDER — DIPHENHYDRAMINE HCL 50 MG/ML IJ SOLN
25.0000 mg | Freq: Once | INTRAMUSCULAR | Status: AC
  Administered 2017-06-27: 25 mg via INTRAVENOUS
  Filled 2017-06-27: qty 1

## 2017-06-27 MED ORDER — SODIUM CHLORIDE 0.9 % IV BOLUS (SEPSIS)
1000.0000 mL | Freq: Once | INTRAVENOUS | Status: AC
  Administered 2017-06-27: 1000 mL via INTRAVENOUS

## 2017-06-27 MED ORDER — PROCHLORPERAZINE EDISYLATE 5 MG/ML IJ SOLN
10.0000 mg | Freq: Once | INTRAMUSCULAR | Status: AC
  Administered 2017-06-27: 10 mg via INTRAVENOUS
  Filled 2017-06-27: qty 2

## 2017-06-27 NOTE — Discharge Instructions (Signed)
Your workup and exam today was consistent with a migraine headache.  As her symptoms have improved after medications, we feel you are safe for discharge home.  Please stay hydrated and get some sleep.  Please follow-up with your primary doctor for further headache management.  If any symptoms change or worsen, please return to the nearest emergency department.

## 2017-06-27 NOTE — ED Provider Notes (Signed)
[facility] EMERGENCY DEPARTMENT Provider Note   CSN: [id number] Arrival date & time: 06/27/17  1649     History   Chief Complaint Chief Complaint  Patient presents with  . Headache    HPI Brittany Quinn is a 27 y.o. female.  The history is provided by the patient and medical records. No language interpreter was used.  Migraine  This is a recurrent problem. The current episode started yesterday. The problem occurs constantly. The problem has not changed since onset.Associated symptoms include headaches. Pertinent negatives include no chest pain, no abdominal pain and no shortness of breath. Exacerbated by: loud noises and btight lights. Nothing relieves the symptoms. She has tried acetaminophen for the symptoms. The treatment provided no relief.    Past Medical History:  Diagnosis Date  . Medical history non-contributory   . Trichomonas     Patient Active Problem List   Diagnosis Date Noted  . Nexplanon in place 08/20/2011    Past Surgical History:  Procedure Laterality Date  . LAPAROSCOPY  06/23/2011   Procedure: LAPAROSCOPY OPERATIVE;  Surgeon: Tereso NewcomerUgonna A Anyanwu, MD;  Location: [facility];  Service: Gynecology;  Laterality: Left;  Diagnostic laparoscopy  . LAPAROTOMY  06/23/2011   Procedure: LAPAROTOMY;  Surgeon: Tereso NewcomerUgonna A Anyanwu, MD;  Location: [facility];  Service: Gynecology;  Laterality: Left;  Wedge resection of Ruptured left cornual ectopic pregnancy with left salpingectomy    OB History    Gravida Para Term Preterm AB Living   2       2     SAB TAB Ectopic Multiple Live Births   1   1           Home Medications    Prior to Admission medications   Not on File    Family History Family History  Problem Relation Age of Onset  . Breast cancer Mother     Social History Social History   Tobacco Use  . Smoking status: Current Every Day Smoker    Types: Cigars  . Smokeless tobacco: Never Used  Substance Use Topics  . Alcohol use: Yes    Comment: occ    . Drug use: Yes    Types: Marijuana     Allergies   Patient has no known allergies.   Review of Systems Review of Systems  Constitutional: Negative for chills, diaphoresis, fatigue and fever.  HENT: Negative for congestion.   Eyes: Negative for visual disturbance.  Respiratory: Negative for cough, chest tightness and shortness of breath.   Cardiovascular: Negative for chest pain, palpitations and leg swelling.  Gastrointestinal: Negative for abdominal pain, constipation, diarrhea and nausea.  Genitourinary: Negative for dysuria and vaginal pain.  Musculoskeletal: Negative for back pain, neck pain and neck stiffness.  Skin: Negative for rash and wound.  Neurological: Positive for headaches. Negative for dizziness, tremors, seizures, syncope, facial asymmetry, speech difficulty, weakness, light-headedness and numbness.  Psychiatric/Behavioral: Negative for agitation.  All other systems reviewed and are negative.    Physical Exam Updated Vital Signs BP 120/79 (BP Location: Right Arm)   Pulse 67   Temp 99.4 F (37.4 C) (Oral)   Resp 18   Ht 5\' 3"  (1.6 m)   Wt 84.2 kg (185 lb 10 oz)   SpO2 95%   BMI 32.88 kg/m   Physical Exam  Constitutional: She is oriented to person, place, and time. She appears well-developed and well-nourished.  Non-toxic appearance. She does not appear ill. No distress.  HENT:  Head: Normocephalic and  atraumatic.  Mouth/Throat: Oropharynx is clear and moist. No oropharyngeal exudate.  Eyes: Conjunctivae and EOM are normal. Pupils are equal, round, and reactive to light.  Neck: Normal range of motion. Neck supple. No JVD present.  Cardiovascular: Normal rate, regular rhythm and intact distal pulses.  No murmur heard. Musculoskeletal: She exhibits no edema or tenderness.  Neurological: She is alert and oriented to person, place, and time. She is not disoriented. She displays no tremor. No cranial nerve deficit or sensory deficit. She exhibits normal  muscle tone. Coordination normal.  Skin: Capillary refill takes less than 2 seconds. No rash noted. She is not diaphoretic. No erythema.  Nursing note and vitals reviewed.    ED Treatments / Results  Labs (all labs ordered are listed, but only abnormal results are displayed) Labs Reviewed - No data to display  EKG  EKG Interpretation None       Radiology No results found.  Procedures Procedures (including critical care time)  Medications Ordered in ED Medications  sodium chloride 0.9 % bolus 1,000 mL (0 mLs Intravenous Stopped 06/27/17 2127)  prochlorperazine (COMPAZINE) injection 10 mg (10 mg Intravenous Given 06/27/17 1956)  dexamethasone (DECADRON) injection 10 mg (10 mg Intravenous Given 06/27/17 1955)  diphenhydrAMINE (BENADRYL) injection 25 mg (25 mg Intravenous Given 06/27/17 1954)     Initial Impression / Assessment and Plan / ED Course  I have reviewed the triage vital signs and the nursing notes.  Pertinent labs & imaging results that were available during my care of the patient were reviewed by me and considered in my medical decision making (see chart for details).     Brittany Quinn is a 27 y.o. female with a past medical history of migraines who presents with headache.  Patient reports that she has had severe headaches for many years.  She reports she typically has a left-sided throbbing headache.  She says that they have been more frequent recently and they have not responded to the Tylenol and Aleve that she normally has success with.  Patient denies any neck pain or neck stiffness.  She denies fevers, chills, vision changes, neurologic deficits, nausea, vomiting, or other complaints.  She describes her headache is severe and throbbing on the right parietal area.  She denies any chest pain, palpitations, or other complaints.  On exam, patient had no focal neurologic deficits.  Patient did have photophobia and phonophobia.  Patient had full range of motion of neck  with no rigidity.  Lungs clear and chest nontender.  Suspect migraine given the patient's history of similar symptoms, reassuring exam, and reassuring story.    Patient was given a headache cocktail through IV to try to alleviate her symptoms.  Anticipate reassessment.  If symptoms have improved, anticipate patient will be safe for discharge home.   Final Clinical Impressions(s) / ED Diagnoses   Final diagnoses:  Bad headache  Other migraine without status migrainosus, not intractable    ED Discharge Orders    None      Clinical Impression: 1. Bad headache   2. Other migraine without status migrainosus, not intractable     Disposition: Discharge  Condition: Good  I have discussed the results, Dx and Tx plan with the pt(& family if present). He/she/they expressed understanding and agree(s) with the plan. Discharge instructions discussed at great length. Strict return precautions discussed and pt &/or family have verbalized understanding of the instructions. No further questions at time of discharge.    There are no discharge medications for  this patient.   Follow Up: [facility] [address] [place] [address] [phone] Schedule an appointment as soon as possible for a visit    [facility] [address] [id number] [address] [place] [address] [phone]       Tegeler, Canary Brim, MD 06/28/17 0005

## 2017-06-27 NOTE — ED Triage Notes (Signed)
C/o HA since 3am-NAD-wearing sunglasses-denies head injury-NAD-steady gait

## 2017-09-16 ENCOUNTER — Other Ambulatory Visit: Payer: Self-pay

## 2017-09-16 ENCOUNTER — Encounter (HOSPITAL_BASED_OUTPATIENT_CLINIC_OR_DEPARTMENT_OTHER): Payer: Self-pay | Admitting: Emergency Medicine

## 2017-09-16 ENCOUNTER — Emergency Department (HOSPITAL_BASED_OUTPATIENT_CLINIC_OR_DEPARTMENT_OTHER)
Admission: EM | Admit: 2017-09-16 | Discharge: 2017-09-16 | Disposition: A | Discharge status: Home / Self Care | Payer: Medicaid Other | Attending: Emergency Medicine | Admitting: Emergency Medicine

## 2017-09-16 DIAGNOSIS — R197 Diarrhea, unspecified: Secondary | ICD-10-CM | POA: Insufficient documentation

## 2017-09-16 DIAGNOSIS — R112 Nausea with vomiting, unspecified: Secondary | ICD-10-CM | POA: Insufficient documentation

## 2017-09-16 DIAGNOSIS — F17210 Nicotine dependence, cigarettes, uncomplicated: Secondary | ICD-10-CM | POA: Insufficient documentation

## 2017-09-16 DIAGNOSIS — A599 Trichomoniasis, unspecified: Secondary | ICD-10-CM | POA: Insufficient documentation

## 2017-09-16 DIAGNOSIS — Z202 Contact with and (suspected) exposure to infections with a predominantly sexual mode of transmission: Secondary | ICD-10-CM | POA: Insufficient documentation

## 2017-09-16 LAB — CBC
HCT: 44 % (ref 36.0–46.0)
Hemoglobin: 15.1 g/dL — ABNORMAL HIGH (ref 12.0–15.0)
MCH: 33.1 pg (ref 26.0–34.0)
MCHC: 34.3 g/dL (ref 30.0–36.0)
MCV: 96.5 fL (ref 78.0–100.0)
PLATELETS: 236 10*3/uL (ref 150–400)
RBC: 4.56 MIL/uL (ref 3.87–5.11)
RDW: 12.2 % (ref 11.5–15.5)
WBC: 8.8 10*3/uL (ref 4.0–10.5)

## 2017-09-16 LAB — WET PREP, GENITAL
SPERM: NONE SEEN
Trich, Wet Prep: NONE SEEN
YEAST WET PREP: NONE SEEN

## 2017-09-16 LAB — COMPREHENSIVE METABOLIC PANEL
ALK PHOS: 56 U/L (ref 38–126)
ALT: 18 U/L (ref 14–54)
AST: 15 U/L (ref 15–41)
Albumin: 3.8 g/dL (ref 3.5–5.0)
Anion gap: 6 (ref 5–15)
BUN: 8 mg/dL (ref 6–20)
CALCIUM: 8.6 mg/dL — AB (ref 8.9–10.3)
CO2: 26 mmol/L (ref 22–32)
CREATININE: 0.67 mg/dL (ref 0.44–1.00)
Chloride: 106 mmol/L (ref 101–111)
GFR calc non Af Amer: >60 mL/min (ref >60)
Glucose, Bld: 92 mg/dL (ref 65–99)
Potassium: 3.6 mmol/L (ref 3.5–5.1)
SODIUM: 138 mmol/L (ref 135–145)
Total Bilirubin: 0.6 mg/dL (ref 0.3–1.2)
Total Protein: 7.1 g/dL (ref 6.5–8.1)

## 2017-09-16 LAB — URINALYSIS, ROUTINE W REFLEX MICROSCOPIC
Bilirubin Urine: NEGATIVE
Glucose, UA: NEGATIVE mg/dL
HGB URINE DIPSTICK: NEGATIVE
KETONES UR: NEGATIVE mg/dL
Nitrite: NEGATIVE
PROTEIN: NEGATIVE mg/dL
Specific Gravity, Urine: 1.02 (ref 1.005–1.030)
pH: 7 (ref 5.0–8.0)

## 2017-09-16 LAB — URINALYSIS, MICROSCOPIC (REFLEX)

## 2017-09-16 LAB — PREGNANCY, URINE: PREG TEST UR: NEGATIVE

## 2017-09-16 LAB — LIPASE, BLOOD: Lipase: 32 U/L (ref 11–51)

## 2017-09-16 MED ORDER — AZITHROMYCIN 250 MG PO TABS
1000.0000 mg | ORAL_TABLET | Freq: Once | ORAL | Status: AC
  Administered 2017-09-16: 1000 mg via ORAL
  Filled 2017-09-16: qty 4

## 2017-09-16 MED ORDER — METRONIDAZOLE 500 MG PO TABS
2000.0000 mg | ORAL_TABLET | Freq: Once | ORAL | Status: AC
  Administered 2017-09-16: 2000 mg via ORAL
  Filled 2017-09-16: qty 4

## 2017-09-16 MED ORDER — CEFTRIAXONE SODIUM 250 MG IJ SOLR
250.0000 mg | Freq: Once | INTRAMUSCULAR | Status: AC
  Administered 2017-09-16: 250 mg via INTRAMUSCULAR
  Filled 2017-09-16: qty 250

## 2017-09-16 MED ORDER — ONDANSETRON 4 MG PO TBDP: 4.0000 mg | ORAL_TABLET | Freq: Three times a day (TID) | ORAL | 0 refills | Status: DC | PRN

## 2017-09-16 NOTE — ED Notes (Signed)
ED Provider at bedside. 

## 2017-09-16 NOTE — Discharge Instructions (Signed)
°  Use a condom with every sexual encounter Follow up with your primary doctor or OBGYN in regards to today's visit.   You have been tested for HIV, syphilis, chlamydia and gonorrhea. These results will be available in approximately 3 days. You will be notified if they are positive.   It was my pleasure taking care of you today!   It is important that you monitor your symptoms and return to the Emergency Department if you develop any of the following symptoms:  You have a fever.  You keep throwing up and can't keep fluids down. You pass bloody or black tarry stools.  There is bright red blood in the stool. You do not seem to be getting better.  You have any questions or concerns.

## 2017-09-16 NOTE — ED Triage Notes (Signed)
Patient reports lower abdominal pain with diarrhea and vomiting which began yesterday.

## 2017-09-16 NOTE — ED Provider Notes (Signed)
[facility] EMERGENCY DEPARTMENT Provider Note   CSN: [id number] Arrival date & time: 09/16/17  1404     History   Chief Complaint Chief Complaint  Patient presents with  . Abdominal Pain    HPI Brittany Quinn is a 27 y.o. female.  The history is provided by the patient and medical records. No language interpreter was used.  Abdominal Pain   Associated symptoms include diarrhea, nausea and vomiting. Pertinent negatives include constipation, dysuria and frequency.   Brittany Quinn is a 27 y.o. female  with a PMH of prior STI's who presents to the Emergency Department complaining of bilateral sharp lower abdominal pain, left > right which began last night. Associated symptoms include nausea and two episodes of emesis. She does not feel nauseous currently. Last emesis was about midnight last night. She has not tried to eat anything today out of fear that she may throw up again. She has had multiple loose non-bloody stool as well beginning last night. She does report vaginal discharge as well as recent unprotected intercourse. No fever, chills, dysuria, back pain, cough, congestion, chest pain, shortness of breath. No medications taken prior to arrival for symptoms. No alleviating or aggravating factors noted.    Past Medical History:  Diagnosis Date  . Medical history non-contributory   . Trichomonas     Patient Active Problem List   Diagnosis Date Noted  . Nexplanon in place 08/20/2011    Past Surgical History:  Procedure Laterality Date  . LAPAROSCOPY  06/23/2011   Procedure: LAPAROSCOPY OPERATIVE;  Surgeon: Tereso Newcomer, MD;  Location: [facility];  Service: Gynecology;  Laterality: Left;  Diagnostic laparoscopy  . LAPAROTOMY  06/23/2011   Procedure: LAPAROTOMY;  Surgeon: Tereso Newcomer, MD;  Location: [facility];  Service: Gynecology;  Laterality: Left;  Wedge resection of Ruptured left cornual ectopic pregnancy with left salpingectomy     OB History    Gravida  2   Para        Term      Preterm      AB  2   Living        SAB  1   TAB      Ectopic  1   Multiple      Live Births               Home Medications    Prior to Admission medications   Medication Sig Start Date End Date Taking? Authorizing Provider  ondansetron (ZOFRAN ODT) 4 MG disintegrating tablet Take 1 tablet (4 mg total) by mouth every 8 (eight) hours as needed for nausea or vomiting. 09/16/17   Trevante Tennell, Chase Picket, PA-C    Family History Family History  Problem Relation Age of Onset  . Breast cancer Mother     Social History Social History   Tobacco Use  . Smoking status: Current Every Day Smoker    Types: Cigars  . Smokeless tobacco: Never Used  Substance Use Topics  . Alcohol use: Yes    Comment: occ  . Drug use: Yes    Types: Marijuana     Allergies   Patient has no known allergies.   Review of Systems Review of Systems  Gastrointestinal: Positive for abdominal pain, diarrhea, nausea and vomiting. Negative for blood in stool and constipation.  Genitourinary: Positive for pelvic pain and vaginal discharge. Negative for difficulty urinating, dysuria, flank pain, frequency and urgency.  All other systems reviewed and are negative.    Physical  Exam Updated Vital Signs BP (!) 96/56 (BP Location: Right Arm)   Pulse 74   Temp 98.4 F (36.9 C) (Oral)   Resp 17   Ht 5\' 3"  (1.6 m)   Wt 88.2 kg (194 lb 7.1 oz)   LMP 09/02/2017 (Approximate)   SpO2 99%   BMI 34.44 kg/m   Physical Exam  Constitutional: She is oriented to person, place, and time. She appears well-developed and well-nourished. No distress.  Non-toxic appearing.  HENT:  Head: Normocephalic and atraumatic.  Cardiovascular: Normal rate, regular rhythm and normal heart sounds.  No murmur heard. Pulmonary/Chest: Effort normal and breath sounds normal. No respiratory distress.  Abdominal: Soft. She exhibits no distension.  Suprapubic tenderness. No rebound or guarding. No CVA  tenderness.  Genitourinary:  Genitourinary Comments: Chaperone present for exam. + discharge. No CMT. No adnexal masses, tenderness, or fullness.  No bleeding within vaginal vault.  Musculoskeletal: She exhibits no edema.  Neurological: She is alert and oriented to person, place, and time.  Skin: Skin is warm and dry.  Nursing note and vitals reviewed.    ED Treatments / Results  Labs (all labs ordered are listed, but only abnormal results are displayed) Labs Reviewed  WET PREP, GENITAL - Abnormal; Notable for the following components:      Result Value   Clue Cells Wet Prep HPF POC PRESENT (*)    WBC, Wet Prep HPF POC FEW (*)    All other components within normal limits  COMPREHENSIVE METABOLIC PANEL - Abnormal; Notable for the following components:   Calcium 8.6 (*)    All other components within normal limits  CBC - Abnormal; Notable for the following components:   Hemoglobin 15.1 (*)    All other components within normal limits  URINALYSIS, ROUTINE W REFLEX MICROSCOPIC - Abnormal; Notable for the following components:   APPearance HAZY (*)    Leukocytes, UA TRACE (*)    All other components within normal limits  URINALYSIS, MICROSCOPIC (REFLEX) - Abnormal; Notable for the following components:   Bacteria, UA FEW (*)    Trichomonas, UA PRESENT (*)    All other components within normal limits  LIPASE, BLOOD  PREGNANCY, URINE  RPR  HIV ANTIBODY (ROUTINE TESTING)  GC/CHLAMYDIA PROBE AMP ([place]) NOT AT [facility]    EKG None  Radiology No results found.  Procedures Procedures (including critical care time)  Medications Ordered in ED Medications  cefTRIAXone (ROCEPHIN) injection 250 mg (250 mg Intramuscular Given 09/16/17 1714)  azithromycin (ZITHROMAX) tablet 1,000 mg (1,000 mg Oral Given 09/16/17 1713)  metroNIDAZOLE (FLAGYL) tablet 2,000 mg (2,000 mg Oral Given 09/16/17 1713)     Initial Impression / Assessment and Plan / ED Course  I have reviewed the triage  vital signs and the nursing notes.  Pertinent labs & imaging results that were available during my care of the patient were reviewed by me and considered in my medical decision making (see chart for details).    Brittany Quinn is a 27 y.o. female who presents to ED for lower abdominal pain associated with vaginal discharge, nausea, vomiting and diarrhea. She does report no longer feeling nauseous. Last emesis was around midnight. On exam today, patient is afebrile, hemodynamically stable and well appearing with no peritoneal signs on abdominal exam. Mild suprapubic tenderness. No CVA tenderness. Pelvic exam with no CMT or adnexal tenderness. She did have fair amount of discharge. UA with trich which was treated in ED today with flagyl. She also wanted prophylactic G&C  treatment which was given. Possible symptoms are due to STI's, however given n/v/d, viral gastroenteritis possible as well. On repeat exam, abdominal exam reassuring. No indication of appendicitis, bowel obstruction, bowel perforation, cholecystitis, diverticulitis or ectopic pregnancy. Patient discharged home with symptomatic treatment and encouraged to follow up with PCP or GYN. I have also discussed reasons to return immediately to the ER. Patient expresses understanding and agrees with plan as dictated above.    Final Clinical Impressions(s) / ED Diagnoses   Final diagnoses:  Trichomoniasis    ED Discharge Orders        Ordered    ondansetron (ZOFRAN ODT) 4 MG disintegrating tablet  Every 8 hours PRN     09/16/17 1710       Sefora Tietje, Chase Picket, PA-C 09/16/17 1719    Maia Plan, MD 09/17/17 1002

## 2017-09-17 LAB — HIV ANTIBODY (ROUTINE TESTING W REFLEX): HIV SCREEN 4TH GENERATION: NONREACTIVE

## 2017-09-17 LAB — RPR: RPR: NONREACTIVE

## 2017-09-17 LAB — GC/CHLAMYDIA PROBE AMP (~~LOC~~) NOT AT ARMC
Chlamydia: NEGATIVE
NEISSERIA GONORRHEA: NEGATIVE

## 2017-10-04 ENCOUNTER — Emergency Department (HOSPITAL_BASED_OUTPATIENT_CLINIC_OR_DEPARTMENT_OTHER)
Admission: EM | Admit: 2017-10-04 | Discharge: 2017-10-04 | Disposition: A | Discharge status: Home / Self Care | Payer: Medicaid Other | Attending: Emergency Medicine | Admitting: Emergency Medicine

## 2017-10-04 ENCOUNTER — Other Ambulatory Visit: Payer: Self-pay

## 2017-10-04 ENCOUNTER — Encounter (HOSPITAL_BASED_OUTPATIENT_CLINIC_OR_DEPARTMENT_OTHER): Payer: Self-pay

## 2017-10-04 DIAGNOSIS — R112 Nausea with vomiting, unspecified: Secondary | ICD-10-CM | POA: Insufficient documentation

## 2017-10-04 DIAGNOSIS — R197 Diarrhea, unspecified: Secondary | ICD-10-CM | POA: Insufficient documentation

## 2017-10-04 DIAGNOSIS — F17290 Nicotine dependence, other tobacco product, uncomplicated: Secondary | ICD-10-CM | POA: Insufficient documentation

## 2017-10-04 LAB — URINALYSIS, ROUTINE W REFLEX MICROSCOPIC
Bilirubin Urine: NEGATIVE
GLUCOSE, UA: NEGATIVE mg/dL
KETONES UR: NEGATIVE mg/dL
LEUKOCYTES UA: NEGATIVE
NITRITE: NEGATIVE
Protein, ur: NEGATIVE mg/dL
Specific Gravity, Urine: 1.025 (ref 1.005–1.030)
pH: 7 (ref 5.0–8.0)

## 2017-10-04 LAB — COMPREHENSIVE METABOLIC PANEL
ALT: 29 U/L (ref 14–54)
AST: 24 U/L (ref 15–41)
Albumin: 4.7 g/dL (ref 3.5–5.0)
Alkaline Phosphatase: 60 U/L (ref 38–126)
Anion gap: 6 (ref 5–15)
BILIRUBIN TOTAL: 0.4 mg/dL (ref 0.3–1.2)
BUN: 9 mg/dL (ref 6–20)
CHLORIDE: 106 mmol/L (ref 101–111)
CO2: 27 mmol/L (ref 22–32)
CREATININE: 0.57 mg/dL (ref 0.44–1.00)
Calcium: 8.9 mg/dL (ref 8.9–10.3)
Glucose, Bld: 93 mg/dL (ref 65–99)
POTASSIUM: 3.7 mmol/L (ref 3.5–5.1)
Sodium: 139 mmol/L (ref 135–145)
TOTAL PROTEIN: 8.3 g/dL — AB (ref 6.5–8.1)

## 2017-10-04 LAB — CBC WITH DIFFERENTIAL/PLATELET
BASOS ABS: 0 10*3/uL (ref 0.0–0.1)
Basophils Relative: 0 %
EOS ABS: 0.1 10*3/uL (ref 0.0–0.7)
Eosinophils Relative: 1 %
HCT: 47.8 % — ABNORMAL HIGH (ref 36.0–46.0)
HEMOGLOBIN: 16.2 g/dL — AB (ref 12.0–15.0)
LYMPHS PCT: 27 %
Lymphs Abs: 2.4 10*3/uL (ref 0.7–4.0)
MCH: 32.8 pg (ref 26.0–34.0)
MCHC: 33.9 g/dL (ref 30.0–36.0)
MCV: 96.8 fL (ref 78.0–100.0)
Monocytes Absolute: 0.8 10*3/uL (ref 0.1–1.0)
Monocytes Relative: 9 %
NEUTROS PCT: 63 %
Neutro Abs: 5.5 10*3/uL (ref 1.7–7.7)
PLATELETS: 273 10*3/uL (ref 150–400)
RBC: 4.94 MIL/uL (ref 3.87–5.11)
RDW: 12.2 % (ref 11.5–15.5)
WBC: 8.8 10*3/uL (ref 4.0–10.5)

## 2017-10-04 LAB — URINALYSIS, MICROSCOPIC (REFLEX)

## 2017-10-04 LAB — PREGNANCY, URINE: PREG TEST UR: NEGATIVE

## 2017-10-04 LAB — LIPASE, BLOOD: LIPASE: 32 U/L (ref 11–51)

## 2017-10-04 MED ORDER — LOPERAMIDE HCL 2 MG PO CAPS: 2.0000 mg | ORAL_CAPSULE | Freq: Four times a day (QID) | ORAL | 0 refills | Status: DC | PRN

## 2017-10-04 MED ORDER — ONDANSETRON HCL 4 MG/2ML IJ SOLN
4.0000 mg | Freq: Once | INTRAMUSCULAR | Status: AC
  Administered 2017-10-04: 4 mg via INTRAVENOUS
  Filled 2017-10-04: qty 2

## 2017-10-04 MED ORDER — SODIUM CHLORIDE 0.9 % IV BOLUS
1000.0000 mL | Freq: Once | INTRAVENOUS | Status: AC
  Administered 2017-10-04: 1000 mL via INTRAVENOUS

## 2017-10-04 MED ORDER — ONDANSETRON 4 MG PO TBDP: 4.0000 mg | ORAL_TABLET | Freq: Three times a day (TID) | ORAL | 0 refills | Status: DC | PRN

## 2017-10-04 MED FILL — ONDANSETRON ODT 4 MG TABLET: 4 | 6 days supply | Qty: 20 | Fill #0

## 2017-10-04 MED FILL — SM ANTI-DIARRHEAL 2 MG CAPL: 2 | 12 days supply | Qty: 48 | Fill #0

## 2017-10-04 NOTE — Discharge Instructions (Signed)

## 2017-10-04 NOTE — ED Provider Notes (Signed)
Emergency Department Provider Note   I have reviewed the triage vital signs and the nursing notes.   HISTORY  Chief Complaint Emesis   HPI Brittany Quinn is a 27 y.o. female with no significant past medical history presents to the emergency department with nausea and vomiting.  Symptoms have been intermittent over the last 3 days.  She states she can "not keep anything down."  She had diarrhea initially but none since.  No blood in the emesis.  No shortness of breath or chest pain.  No sick contacts.  Denies any abdominal pain or back pain.  No UTI symptoms.   Past Medical History:  Diagnosis Date  . Medical history non-contributory   . Trichomonas     Patient Active Problem List   Diagnosis Date Noted  . Nexplanon in place 08/20/2011    Past Surgical History:  Procedure Laterality Date  . LAPAROSCOPY  06/23/2011   Procedure: LAPAROSCOPY OPERATIVE;  Surgeon: Tereso Newcomer, MD;  Location: [facility];  Service: Gynecology;  Laterality: Left;  Diagnostic laparoscopy  . LAPAROTOMY  06/23/2011   Procedure: LAPAROTOMY;  Surgeon: Tereso Newcomer, MD;  Location: [facility];  Service: Gynecology;  Laterality: Left;  Wedge resection of Ruptured left cornual ectopic pregnancy with left salpingectomy    Current Outpatient Rx  . Order #: [id number] Class: Print  . Order #: [id number] Class: Print    Allergies Patient has no known allergies.  Family History  Problem Relation Age of Onset  . Breast cancer Mother     Social History Social History   Tobacco Use  . Smoking status: Current Every Day Smoker    Types: Cigars  . Smokeless tobacco: Never Used  Substance Use Topics  . Alcohol use: Yes    Comment: occ  . Drug use: Yes    Types: Marijuana    Review of Systems  Constitutional: No fever/chills Eyes: No visual changes. ENT: No sore throat. Cardiovascular: Denies chest pain. Respiratory: Denies shortness of breath. Gastrointestinal: No abdominal pain. Positive nausea and  vomiting.  No diarrhea.  No constipation. Genitourinary: Negative for dysuria. Musculoskeletal: Negative for back pain. Skin: Negative for rash. Neurological: Negative for headaches, focal weakness or numbness.  10-point ROS otherwise negative.  ____________________________________________   PHYSICAL EXAM:  VITAL SIGNS: ED Triage Vitals  Enc Vitals Group     BP 10/04/17 1128 134/84     Pulse Rate 10/04/17 1128 82     Resp 10/04/17 1128 16     Temp 10/04/17 1128 98.3 F (36.8 C)     Temp Source 10/04/17 1128 Oral     SpO2 10/04/17 1128 98 %     Weight 10/04/17 1128 190 lb (86.2 kg)     Height 10/04/17 1128 5\' 3"  (1.6 m)     Pain Score 10/04/17 1127 0   Constitutional: Alert and oriented. Well appearing and in no acute distress. Eyes: Conjunctivae are normal. Head: Atraumatic. Nose: No congestion/rhinnorhea. Mouth/Throat: Mucous membranes are moist.  Oropharynx non-erythematous. Neck: No stridor.   Cardiovascular: Normal rate, regular rhythm. Good peripheral circulation. Grossly normal heart sounds.   Respiratory: Normal respiratory effort.  No retractions. Lungs CTAB. Gastrointestinal: Soft and nontender. No distention.  Musculoskeletal: No lower extremity tenderness nor edema. No gross deformities of extremities. Neurologic:  Normal speech and language. No gross focal neurologic deficits are appreciated.  Skin:  Skin is warm, dry and intact. No rash noted.  ____________________________________________   LABS (all labs ordered are listed, but only abnormal results  are displayed)  Labs Reviewed  COMPREHENSIVE METABOLIC PANEL - Abnormal; Notable for the following components:      Result Value   Total Protein 8.3 (*)    All other components within normal limits  CBC WITH DIFFERENTIAL/PLATELET - Abnormal; Notable for the following components:   Hemoglobin 16.2 (*)    HCT 47.8 (*)    All other components within normal limits  URINALYSIS, ROUTINE W REFLEX MICROSCOPIC -  Abnormal; Notable for the following components:   Hgb urine dipstick MODERATE (*)    All other components within normal limits  URINALYSIS, MICROSCOPIC (REFLEX) - Abnormal; Notable for the following components:   Bacteria, UA MANY (*)    All other components within normal limits  LIPASE, BLOOD  PREGNANCY, URINE   ____________________________________________  RADIOLOGY  None ____________________________________________   PROCEDURES  Procedure(s) performed:   Procedures  None ____________________________________________   INITIAL IMPRESSION / ASSESSMENT AND PLAN / ED COURSE  Pertinent labs & imaging results that were available during my care of the patient were reviewed by me and considered in my medical decision making (see chart for details).  Patient presents to the emergency department with nausea and vomiting.  She has a nontender abdomen. Symptoms have been ongoing for 3 days.  In the setting of persistent vomiting plan for labs, IV fluids, Zofran.  Given abdominal exam no indication for advanced imaging of the abdomen at this time.  Patient improved after IVF and Zofran. She is feeling better and tolerating. No abdominal pain. No indication for imaging at this time.   At this time, I do not feel there is any life-threatening condition present. I have reviewed and discussed all results (EKG, imaging, lab, urine as appropriate), exam findings with patient. I have reviewed nursing notes and appropriate previous records.  I feel the patient is safe to be discharged home without further emergent workup. Discussed usual and customary return precautions. Patient and family (if present) verbalize understanding and are comfortable with this plan.  Patient will follow-up with their primary care provider. If they do not have a primary care provider, information for follow-up has been provided to them. All questions have been  answered.  ____________________________________________  FINAL CLINICAL IMPRESSION(S) / ED DIAGNOSES  Final diagnoses:  Nausea vomiting and diarrhea     MEDICATIONS GIVEN DURING THIS VISIT:  Medications  sodium chloride 0.9 % bolus 1,000 mL (0 mLs Intravenous Stopped 10/04/17 1424)  ondansetron (ZOFRAN) injection 4 mg (4 mg Intravenous Given 10/04/17 1246)     NEW OUTPATIENT MEDICATIONS STARTED DURING THIS VISIT:  Discharge Medication List as of 10/04/2017  2:01 PM    START taking these medications   Details  loperamide (IMODIUM) 2 MG capsule Take 1 capsule (2 mg total) by mouth 4 (four) times daily as needed for diarrhea or loose stools., Starting Fri 10/04/2017, Print        Note:  This document was prepared using Dragon voice recognition software and may include unintentional dictation errors.  Alona BeneJoshua Shaasia Odle, MD Emergency Medicine    Sobia Karger, Arlyss RepressJoshua G, MD 10/04/17 [phone]

## 2017-10-04 NOTE — ED Triage Notes (Signed)
C/o n/v x 3 days-NAD-steady gait 

## 2018-01-13 ENCOUNTER — Ambulatory Visit: Payer: Medicaid Other | Admitting: Internal Medicine

## 2018-01-13 ENCOUNTER — Encounter: Payer: Self-pay | Admitting: Internal Medicine

## 2018-01-13 ENCOUNTER — Other Ambulatory Visit (HOSPITAL_COMMUNITY)
Admission: RE | Admit: 2018-01-13 | Discharge: 2018-01-13 | Disposition: A | Discharge status: Home / Self Care | Payer: Medicaid Other | Source: Ambulatory Visit | Attending: Internal Medicine | Admitting: Internal Medicine

## 2018-01-13 VITALS — BP 119/60 | HR 75 | Ht 63.5 in | Wt 196.6 lb

## 2018-01-13 DIAGNOSIS — Z113 Encounter for screening for infections with a predominantly sexual mode of transmission: Secondary | ICD-10-CM | POA: Insufficient documentation

## 2018-01-13 DIAGNOSIS — Z3046 Encounter for surveillance of implantable subdermal contraceptive: Secondary | ICD-10-CM

## 2018-01-13 NOTE — Progress Notes (Signed)
Wants to get nexplanon removed and std screening. Notified her not covered by family planning medicaid. C/o with last period has been foul smelling.

## 2018-01-13 NOTE — Patient Instructions (Signed)
Your Nexplanon was removed today. You can become pregnant right away after removal. Use another method of birth control if you are not interested in becoming pregnant. We will notify you of your test results from today.

## 2018-01-14 NOTE — Progress Notes (Signed)
   Subjective:    Brittany Quinn - 27 y.o. female MRN [id number]  Date of birth: Jan 07, 1991  HPI  Brittany Quinn is a 27 y.o. G32P0020 female here for Nexplanon removal. Has been in place for 3 years, requests removal without re-insertion. Declines other methods of contraception.   Also requests STD screening. Patient is asymptomatic.      OB History    Gravida  2   Para      Term      Preterm      AB  2   Living        SAB  1   TAB      Ectopic  1   Multiple      Live Births              -  reports that she has been smoking cigars. She has never used smokeless tobacco. - Review of Systems: Per HPI. - Past Medical History: Patient Active Problem List   Diagnosis Date Noted  . Nexplanon in place 08/20/2011   - Medications: reviewed and updated   Objective:   Physical Exam BP 119/60   Pulse 75   Ht 5' 3.5" (1.613 m)   Wt 196 lb 9.6 oz (89.2 kg)   LMP 12/05/2017   BMI 34.28 kg/m  Gen: NAD, alert, cooperative with exam, well-appearing GU/GYN: Exam performed in the presence of a chaperone. External genitalia within normal limits.  Vaginal mucosa pink, moist, normal rugae. Blind swab performed without evidence of vaginal discharge. Bimanual exam revealed normal, nongravid uterus.  No cervical motion tenderness. No adnexal masses bilaterally.      PROCEDURE NOTE: NEXPLANON  REMOVAL Patient given informed consent and signed copy in the chart. Left arm area prepped and draped in the usual sterile fashion. Four cc of xylocaine without epinephrine 1% used for local anesthesia. A small stab incision was made close to the nexplanon with scalpel. Hemostats were used to withdraw the nexplanon. Guaze with a pressure dressing was applied. No complications.Patient given follow up instructions should she experience redness, swelling at sight or fever in the next 24 hours. Patient was reminded this totally removes her nexplanon contraceptive device. (she can now potentially  conceive).      Assessment & Plan:   1. Nexplanon removal Nexplanon removed successfully without complications. After care instructions and return precautions reviewed. Advised that she can now conceive and she should use an alternate method of birth control if she does not wish to become pregnant.   2. Screening for STD (sexually transmitted disease) Requests vaginal cultures. Declines HIV and RPR testing.  - Cervicovaginal ancillary only   Routine preventative health maintenance measures emphasized. Please refer to After Visit Summary for other counseling recommendations.   Return for annual exam.  Marcy Siren, D.O. OB Fellow  01/14/2018, 6:44 PM

## 2018-01-15 ENCOUNTER — Encounter: Payer: Self-pay | Admitting: *Deleted

## 2018-01-15 LAB — CERVICOVAGINAL ANCILLARY ONLY
CHLAMYDIA, DNA PROBE: NEGATIVE
NEISSERIA GONORRHEA: NEGATIVE
TRICH (WINDOWPATH): NEGATIVE

## 2018-02-03 ENCOUNTER — Encounter (HOSPITAL_COMMUNITY): Payer: Self-pay | Admitting: Emergency Medicine

## 2018-02-03 ENCOUNTER — Ambulatory Visit (HOSPITAL_COMMUNITY)
Admission: EM | Admit: 2018-02-03 | Discharge: 2018-02-03 | Disposition: A | Discharge status: Home / Self Care | Payer: Medicaid Other | Attending: Family Medicine | Admitting: Family Medicine

## 2018-02-03 DIAGNOSIS — J069 Acute upper respiratory infection, unspecified: Secondary | ICD-10-CM

## 2018-02-03 DIAGNOSIS — R05 Cough: Secondary | ICD-10-CM

## 2018-02-03 DIAGNOSIS — B9789 Other viral agents as the cause of diseases classified elsewhere: Secondary | ICD-10-CM

## 2018-02-03 MED ORDER — FLUTICASONE PROPIONATE 50 MCG/ACT NA SUSP: 2.0000 | Freq: Every day | NASAL | 0 refills | Status: DC

## 2018-02-03 MED ORDER — PREDNISONE 50 MG PO TABS: 50.0000 mg | ORAL_TABLET | Freq: Every day | ORAL | 0 refills | Status: DC

## 2018-02-03 MED ORDER — IPRATROPIUM BROMIDE 0.06 % NA SOLN: 2.0000 | Freq: Four times a day (QID) | NASAL | 0 refills | Status: DC

## 2018-02-03 NOTE — ED Triage Notes (Signed)
Pt c/o coughing since Friday, pt also c/o sore throat and chest congestion and body aches. Nasal congestion

## 2018-02-03 NOTE — ED Provider Notes (Signed)
[facility]    CSN: [id number] Arrival date & time: 02/03/18  1103     History   Chief Complaint Chief Complaint  Patient presents with  . Cough    HPI Brittany Quinn is a 27 y.o. female.   27 year old female comes in for 4 day history of URI symptoms.  She has had cough, congestion, rhinorrhea, sore throat, body aches.  Denies fever, chills, night sweats.  Has been taking OTC cold medication with mild relief.  Current everyday smoker.     Past Medical History:  Diagnosis Date  . Medical history non-contributory   . Trichomonas     Patient Active Problem List   Diagnosis Date Noted  . Nexplanon in place 08/20/2011    Past Surgical History:  Procedure Laterality Date  . LAPAROSCOPY  06/23/2011   Procedure: LAPAROSCOPY OPERATIVE;  Surgeon: Tereso Newcomer, MD;  Location: [facility];  Service: Gynecology;  Laterality: Left;  Diagnostic laparoscopy  . LAPAROTOMY  06/23/2011   Procedure: LAPAROTOMY;  Surgeon: Tereso Newcomer, MD;  Location: [facility];  Service: Gynecology;  Laterality: Left;  Wedge resection of Ruptured left cornual ectopic pregnancy with left salpingectomy    OB History    Gravida  2   Para      Term      Preterm      AB  2   Living        SAB  1   TAB      Ectopic  1   Multiple      Live Births               Home Medications    Prior to Admission medications   Medication Sig Start Date End Date Taking? Authorizing Provider  fluticasone (FLONASE) 50 MCG/ACT nasal spray Place 2 sprays into both nostrils daily. 02/03/18   Cathie Hoops, Delmar Arriaga V, PA-C  ipratropium (ATROVENT) 0.06 % nasal spray Place 2 sprays into both nostrils 4 (four) times daily. 02/03/18   Cathie Hoops, Chena Chohan V, PA-C  predniSONE (DELTASONE) 50 MG tablet Take 1 tablet (50 mg total) by mouth daily. 02/03/18   Belinda Fisher, PA-C    Family History Family History  Problem Relation Age of Onset  . Breast cancer Mother     Social History Social History   Tobacco Use  . Smoking  status: Current Every Day Smoker    Types: Cigars  . Smokeless tobacco: Never Used  Substance Use Topics  . Alcohol use: Yes    Comment: occ  . Drug use: Yes    Types: Marijuana    Comment: occasionally     Allergies   Patient has no known allergies.   Review of Systems Review of Systems  Reason unable to perform ROS: See HPI as above.     Physical Exam Triage Vital Signs ED Triage Vitals [02/03/18 1207]  Enc Vitals Group     BP 114/60     Pulse Rate 84     Resp 16     Temp 98.5 F (36.9 C)     Temp src      SpO2 100 %     Weight      Height      Head Circumference      Peak Flow      Pain Score 8     Pain Loc      Pain Edu?      Excl. in GC?    No  data found.  Updated Vital Signs BP 114/60   Pulse 84   Temp 98.5 F (36.9 C)   Resp 16   LMP 01/03/2018   SpO2 100%   Physical Exam  Constitutional: She is oriented to person, place, and time. She appears well-developed and well-nourished. No distress.  HENT:  Head: Normocephalic and atraumatic.  Right Ear: Tympanic membrane, external ear and ear canal normal. Tympanic membrane is not erythematous and not bulging.  Left Ear: Tympanic membrane, external ear and ear canal normal. Tympanic membrane is not erythematous and not bulging.  Nose: Mucosal edema and rhinorrhea present. Right sinus exhibits maxillary sinus tenderness and frontal sinus tenderness. Left sinus exhibits maxillary sinus tenderness and frontal sinus tenderness.  Mouth/Throat: Uvula is midline, oropharynx is clear and moist and mucous membranes are normal.  Eyes: Pupils are equal, round, and reactive to light. Conjunctivae are normal.  Neck: Normal range of motion. Neck supple.  Cardiovascular: Normal rate, regular rhythm and normal heart sounds. Exam reveals no gallop and no friction rub.  No murmur heard. Pulmonary/Chest: Effort normal and breath sounds normal. She has no decreased breath sounds. She has no wheezes. She has no rhonchi. She  has no rales.  Lymphadenopathy:    She has no cervical adenopathy.  Neurological: She is alert and oriented to person, place, and time.  Skin: Skin is warm and dry. She is not diaphoretic.  Psychiatric: She has a normal mood and affect. Her behavior is normal. Judgment normal.     UC Treatments / Results  Labs (all labs ordered are listed, but only abnormal results are displayed) Labs Reviewed - No data to display  EKG None  Radiology No results found.  Procedures Procedures (including critical care time)  Medications Ordered in UC Medications - No data to display  Initial Impression / Assessment and Plan / UC Course  I have reviewed the triage vital signs and the nursing notes.  Pertinent labs & imaging results that were available during my care of the patient were reviewed by me and considered in my medical decision making (see chart for details).    Discussed with patient history and exam most consistent with viral URI. Symptomatic treatment as needed. Push fluids. Return precautions given.   Final Clinical Impressions(s) / UC Diagnoses   Final diagnoses:  Viral URI with cough    ED Prescriptions    Medication Sig Dispense Auth. Provider   predniSONE (DELTASONE) 50 MG tablet Take 1 tablet (50 mg total) by mouth daily. 5 tablet Zohair Epp V, PA-C   fluticasone (FLONASE) 50 MCG/ACT nasal spray Place 2 sprays into both nostrils daily. 1 g Youssouf Shipley V, PA-C   ipratropium (ATROVENT) 0.06 % nasal spray Place 2 sprays into both nostrils 4 (four) times daily. 15 mL Threasa Alpha, [place] 02/03/18 1244

## 2018-02-03 NOTE — Discharge Instructions (Signed)
Start prednisone as directed. Start flonase, atrovent nasal spray for nasal congestion/drainage. You can use over the counter nasal saline rinse such as neti pot for nasal congestion. Keep hydrated, your urine should be clear to pale yellow in color. Tylenol/motrin for fever and pain. Monitor for any worsening of symptoms, chest pain, shortness of breath, wheezing, swelling of the throat, follow up for reevaluation.  ° °For sore throat/cough try using a honey-based tea. Use 3 teaspoons of honey with juice squeezed from half lemon. Place shaved pieces of ginger into 1/2-1 cup of water and warm over stove top. Then mix the ingredients and repeat every 4 hours as needed. °

## 2018-06-24 ENCOUNTER — Encounter (HOSPITAL_COMMUNITY): Payer: Self-pay | Admitting: Emergency Medicine

## 2018-06-24 ENCOUNTER — Ambulatory Visit (HOSPITAL_COMMUNITY)
Admission: EM | Admit: 2018-06-24 | Discharge: 2018-06-24 | Disposition: A | Discharge status: Home / Self Care | Payer: 59 | Attending: Emergency Medicine | Admitting: Emergency Medicine

## 2018-06-24 DIAGNOSIS — M7918 Myalgia, other site: Secondary | ICD-10-CM

## 2018-06-24 DIAGNOSIS — R51 Headache: Secondary | ICD-10-CM | POA: Diagnosis not present

## 2018-06-24 DIAGNOSIS — R69 Illness, unspecified: Principal | ICD-10-CM

## 2018-06-24 DIAGNOSIS — R05 Cough: Secondary | ICD-10-CM | POA: Diagnosis not present

## 2018-06-24 DIAGNOSIS — F17210 Nicotine dependence, cigarettes, uncomplicated: Secondary | ICD-10-CM

## 2018-06-24 DIAGNOSIS — J029 Acute pharyngitis, unspecified: Secondary | ICD-10-CM | POA: Diagnosis not present

## 2018-06-24 DIAGNOSIS — J111 Influenza due to unidentified influenza virus with other respiratory manifestations: Secondary | ICD-10-CM

## 2018-06-24 MED ORDER — AEROCHAMBER PLUS MISC: 2 refills | Status: DC

## 2018-06-24 MED ORDER — ALBUTEROL SULFATE HFA 108 (90 BASE) MCG/ACT IN AERS
1.0000 | INHALATION_SPRAY | Freq: Four times a day (QID) | RESPIRATORY_TRACT | 0 refills | Status: DC | PRN
Start: 2018-06-24 — End: 2018-12-17

## 2018-06-24 MED ORDER — HYDROCOD POLST-CPM POLST ER 10-8 MG/5ML PO SUER: 5.0000 mL | Freq: Two times a day (BID) | ORAL | 0 refills | Status: DC | PRN

## 2018-06-24 MED ORDER — IBUPROFEN 600 MG PO TABS: 600.0000 mg | ORAL_TABLET | Freq: Four times a day (QID) | ORAL | 0 refills | Status: DC | PRN

## 2018-06-24 MED ORDER — FLUTICASONE PROPIONATE 50 MCG/ACT NA SUSP: 2.0000 | Freq: Every day | NASAL | 0 refills | Status: DC

## 2018-06-24 NOTE — ED Triage Notes (Signed)
Pt c/o cough, chest congestion, body aches sicne Saturday.

## 2018-06-24 NOTE — ED Provider Notes (Signed)
HPI  SUBJECTIVE:  Brittany Quinn is a 28 y.o. female who presents with 4 days of body aches, cough occasionally productive of white-green mucus, sore throat secondary to cough, chest soreness from the cough, headaches.  She denies fevers but reports chills.  No nasal congestion, rhinorrhea, postnasal drip, sinus pain or pressure, wheezing, shortness of breath, dyspnea on exertion, neck stiffness.  She states this feels similar to when she had the flu last year.  She got a flu shot this year.  No known contacts with the flu.  States that she is unable to sleep at night secondary to the cough.  No antipyretic in the past 4 to 6 hours.  No antibiotics in the past month.  She has tried a triage Pertussin with temporary relief in her symptoms.  No aggravating factors.  No history of asthma, she is a smoker.  No history of diabetes, hypertension, frequent sinusitis.  LMP: 2/27.  Denies the possibility of being pregnant.  PMD: None.    Past Medical History:  Diagnosis Date  . Medical history non-contributory   . Trichomonas     Past Surgical History:  Procedure Laterality Date  . LAPAROSCOPY  06/23/2011   Procedure: LAPAROSCOPY OPERATIVE;  Surgeon: Tereso Newcomer, MD;  Location: [facility];  Service: Gynecology;  Laterality: Left;  Diagnostic laparoscopy  . LAPAROTOMY  06/23/2011   Procedure: LAPAROTOMY;  Surgeon: Tereso Newcomer, MD;  Location: [facility];  Service: Gynecology;  Laterality: Left;  Wedge resection of Ruptured left cornual ectopic pregnancy with left salpingectomy    Family History  Problem Relation Age of Onset  . Breast cancer Mother     Social History   Tobacco Use  . Smoking status: Current Every Day Smoker    Types: Cigars  . Smokeless tobacco: Never Used  Substance Use Topics  . Alcohol use: Yes    Comment: occ  . Drug use: Yes    Types: Marijuana    Comment: occasionally    No current facility-administered medications for this encounter.   Current Outpatient  Medications:  .  albuterol (PROVENTIL HFA;VENTOLIN HFA) 108 (90 Base) MCG/ACT inhaler, Inhale 1-2 puffs into the lungs every 6 (six) hours as needed for wheezing or shortness of breath., Disp: 1 Inhaler, Rfl: 0 .  chlorpheniramine-HYDROcodone (TUSSIONEX PENNKINETIC ER) 10-8 MG/5ML SUER, Take 5 mLs by mouth every 12 (twelve) hours as needed for cough., Disp: 60 mL, Rfl: 0 .  fluticasone (FLONASE) 50 MCG/ACT nasal spray, Place 2 sprays into both nostrils daily., Disp: 16 g, Rfl: 0 .  ibuprofen (ADVIL,MOTRIN) 600 MG tablet, Take 1 tablet (600 mg total) by mouth every 6 (six) hours as needed., Disp: 30 tablet, Rfl: 0 .  Spacer/Aero-Holding Chambers (AEROCHAMBER PLUS) inhaler, Use as instructed, Disp: 1 each, Rfl: 2  No Known Allergies   ROS  As noted in HPI.   Physical Exam  BP (!) 116/56   Pulse 81   Temp 99.2 F (37.3 C)   Resp 18   LMP 06/12/2018   SpO2 99%   Constitutional: Well developed, well nourished, no acute distress Eyes:  EOMI, conjunctiva normal bilaterally HENT: Normocephalic, atraumatic,mucus membranes moist.  Positive clear nasal congestion.  Erythematous, swollen turbinates on the right side.  No maxillary, frontal sinus tenderness.  Normal tonsils without exudates.  Uvula midline.  Positive cobblestoning.  No obvious postnasal drip. Neck: Positive shotty  cervical lymphadenopathy.  No meningismus. Respiratory: Normal inspiratory effort, lungs clear bilaterally, good air movement.  Positive anterior and  lateral chest wall tenderness. Cardiovascular: Normal rate, regular rhythm, no murmurs rubs or gallops GI: nondistended skin: No rash, skin intact Musculoskeletal: no deformities Neurologic: Alert & oriented x 3, no focal neuro deficits Psychiatric: Speech and behavior appropriate   ED Course   Medications - No data to display  No orders of the defined types were placed in this encounter.   No results found for this or any previous visit (from the past 24  hour(s)). No results found.  ED Clinical Impression  Influenza-like illness   ED Assessment/Plan  [place] Narcotic database reviewed for this patient, and feel that the risk/benefit ratio today is favorable for proceeding with a prescription for controlled substance.  Last opiate prescription in 2018.  Patient with an influenza-like illness.  It is too late to prescribe Tamiflu.  No evidence of any complications from the flu today.  Will do supportive treatment including Flonase, saline nasal irrigation with a Lloyd Huger med rinse and distilled water as often as she wants, Tussionex, 2 puffs from an albuterol inhaler using a spacer every 4-6 hours, ibuprofen 600 mg combined with 1 g of Tylenol 3-4 times a day.  Continue Mucinex.  Work note for today and tomorrow.  Providing primary care list for ongoing care.  Discussed MDM, treatment plan, and plan for follow-up with patient. Discussed sn/sx that should prompt return to the ED. patient agrees with plan.   Meds ordered this encounter  Medications  . fluticasone (FLONASE) 50 MCG/ACT nasal spray    Sig: Place 2 sprays into both nostrils daily.    Dispense:  16 g    Refill:  0  . albuterol (PROVENTIL HFA;VENTOLIN HFA) 108 (90 Base) MCG/ACT inhaler    Sig: Inhale 1-2 puffs into the lungs every 6 (six) hours as needed for wheezing or shortness of breath.    Dispense:  1 Inhaler    Refill:  0  . Spacer/Aero-Holding Chambers (AEROCHAMBER PLUS) inhaler    Sig: Use as instructed    Dispense:  1 each    Refill:  2  . chlorpheniramine-HYDROcodone (TUSSIONEX PENNKINETIC ER) 10-8 MG/5ML SUER    Sig: Take 5 mLs by mouth every 12 (twelve) hours as needed for cough.    Dispense:  60 mL    Refill:  0  . ibuprofen (ADVIL,MOTRIN) 600 MG tablet    Sig: Take 1 tablet (600 mg total) by mouth every 6 (six) hours as needed.    Dispense:  30 tablet    Refill:  0    *This clinic note was created using Scientist, clinical (histocompatibility and immunogenetics). Therefore, there may be occasional  mistakes despite careful proofreading.   ?    Domenick Gong, MD 06/25/18 [phone]

## 2018-06-24 NOTE — Discharge Instructions (Addendum)
Flonase, saline nasal irrigation with a Lloyd Huger med rinse and distilled water as often as you want, Tussionex, 2 puffs from an albuterol inhaler using a spacer every 4-6 hours, ibuprofen 600 mg combined with 1 g of Tylenol 3-4 times a day.  Continue Mucinex.  Below is a list of primary care practices who are taking new patients for you to follow-up with.  [facility] [address] [place], [place] [address] [phone]  [facility] [address] [place], [place] [address] [phone]  [facility] [phone] [address] [place] [address]  [facility]: [address] [place] [address]  [phone]  [facility]: [address] [place] [address]   [phone]  [facility]: [address] [place] [address]  [phone]  [facility] : [address] [place] [address] [phone]  [facility]: [address] [place] [address] [phone]  [facility]: [address] [place] [address] [phone]  Dr. Oneal Grout [address] [facility] [place] [address]  [phone]  Dr. Jackie Plum, [facility]. [address] [place], [place] [address]  [phone]  Go to www.goodrx.com to look up your medications. This will give you a list of where you can find your prescriptions at the most affordable prices. Or ask the pharmacist what the cash price is, or if they have any other discount programs available to help make your medication more affordable. This can be less expensive than what you would pay with insurance.

## 2018-10-20 ENCOUNTER — Telehealth: Payer: Self-pay | Admitting: Family Medicine

## 2018-10-20 NOTE — Telephone Encounter (Signed)
Patient called in to get scheduled for a pregnancy test. Patient was scheduled for 7/9 @ 2:30. Patient instructed to wear a face mask for the entire appointment and no visitors are allowed with her. Patient screened for covid symptoms and denied having any. Patient instructed to to give the office a call if she develops any symptoms prior to her appointment.

## 2018-10-23 ENCOUNTER — Ambulatory Visit (INDEPENDENT_AMBULATORY_CARE_PROVIDER_SITE_OTHER): Payer: Medicaid Other | Admitting: *Deleted

## 2018-10-23 ENCOUNTER — Encounter: Payer: Self-pay | Admitting: Family Medicine

## 2018-10-23 ENCOUNTER — Other Ambulatory Visit: Payer: Self-pay

## 2018-10-23 DIAGNOSIS — Z3201 Encounter for pregnancy test, result positive: Secondary | ICD-10-CM | POA: Diagnosis present

## 2018-10-23 DIAGNOSIS — Z3200 Encounter for pregnancy test, result unknown: Secondary | ICD-10-CM

## 2018-10-23 LAB — POCT PREGNANCY, URINE: Preg Test, Ur: POSITIVE — AB

## 2018-10-23 MED ORDER — PRENATAL VITAMIN 27-0.8 MG PO TABS: 1.0000 | ORAL_TABLET | Freq: Every day | ORAL | 12 refills | Status: DC

## 2018-10-23 NOTE — Progress Notes (Signed)
G3 P0020  Pt presents for UPT which is positive. She reports sure LMP 09/19/18.  EDD 06/26/19. Pt has Hx of ectopic pregnancy and Lt salpingectomy ([place]) on 06/23/11. She denies having abdominal pain or vaginal bleeding @ present. Pt advised to schedule prenatal care and begin prenatal vitamins - Rx sent. She was informed to go to [facility] immediately of she develops abdominal pain or vaginal bleeding more than spotting. Pt voiced understanding of all information and instructions given.

## 2018-11-01 NOTE — Progress Notes (Signed)
Chart reviewed for nurse visit. Agree with plan of care.   Edmond Ginsberg Lauren, DO 11/01/2018 8:20 PM  

## 2018-12-02 ENCOUNTER — Other Ambulatory Visit: Payer: Self-pay

## 2018-12-02 ENCOUNTER — Ambulatory Visit (INDEPENDENT_AMBULATORY_CARE_PROVIDER_SITE_OTHER): Payer: Medicaid Other | Admitting: *Deleted

## 2018-12-02 ENCOUNTER — Encounter: Payer: Self-pay | Admitting: *Deleted

## 2018-12-02 DIAGNOSIS — O0910 Supervision of pregnancy with history of ectopic or molar pregnancy, unspecified trimester: Secondary | ICD-10-CM | POA: Insufficient documentation

## 2018-12-02 DIAGNOSIS — Z3490 Encounter for supervision of normal pregnancy, unspecified, unspecified trimester: Secondary | ICD-10-CM | POA: Insufficient documentation

## 2018-12-02 MED ORDER — BLOOD PRESSURE KIT DEVI: 1.0000 | 0 refills | Status: DC | PRN

## 2018-12-02 NOTE — Progress Notes (Addendum)
I connected with  Brittany Quinn on 12/02/18 at  8:15 AM EDT by telephone and verified that I am speaking with the correct person using two identifiers.   I discussed the limitations, risks, security and privacy concerns of performing an evaluation and management service by telephone and the availability of in person appointments. I also discussed with the patient that there may be a patient responsible charge related to this service. The patient expressed understanding and agreed to proceed.  New Ob intake completed. Pt informed that her prenatal care will include a combination of virtual as well as face to face visits. Covid restrictions explained. Pt denied presence of any problems at this time. She agreed to check BP weekly and will record results to Bab[employer]s App. BP cuff will be sent to pt's home. [place] for anatomy scheduled on 9/23 @ 0930. Pt voiced understanding of all information and instructions given.   [name], [name], RN 12/02/2018  9:02 AM    Chart reviewed for nurse visit. Agree with plan of care.   [name], NP 12/02/2018 12:08 PM

## 2018-12-17 ENCOUNTER — Other Ambulatory Visit: Payer: Self-pay

## 2018-12-17 ENCOUNTER — Ambulatory Visit (INDEPENDENT_AMBULATORY_CARE_PROVIDER_SITE_OTHER): Payer: Medicaid Other | Admitting: Nurse Practitioner

## 2018-12-17 ENCOUNTER — Encounter: Payer: Self-pay | Admitting: Nurse Practitioner

## 2018-12-17 VITALS — BP 117/75 | HR 86 | Wt 202.0 lb

## 2018-12-17 DIAGNOSIS — Z3490 Encounter for supervision of normal pregnancy, unspecified, unspecified trimester: Secondary | ICD-10-CM

## 2018-12-17 DIAGNOSIS — Z87891 Personal history of nicotine dependence: Secondary | ICD-10-CM | POA: Insufficient documentation

## 2018-12-17 DIAGNOSIS — Z3492 Encounter for supervision of normal pregnancy, unspecified, second trimester: Secondary | ICD-10-CM

## 2018-12-17 DIAGNOSIS — K5900 Constipation, unspecified: Secondary | ICD-10-CM

## 2018-12-17 DIAGNOSIS — Z3A16 16 weeks gestation of pregnancy: Secondary | ICD-10-CM

## 2018-12-17 DIAGNOSIS — O99213 Obesity complicating pregnancy, third trimester: Secondary | ICD-10-CM | POA: Insufficient documentation

## 2018-12-17 DIAGNOSIS — Z6834 Body mass index (BMI) 34.0-34.9, adult: Secondary | ICD-10-CM

## 2018-12-17 MED ORDER — DOXYLAMINE-PYRIDOXINE 10-10 MG PO TBEC: DELAYED_RELEASE_TABLET | ORAL | 2 refills | Status: DC

## 2018-12-17 MED ORDER — VITAFOL GUMMIES 3.33-0.333-34.8 MG PO CHEW: 3.0000 | CHEWABLE_TABLET | Freq: Every day | ORAL | 11 refills | Status: DC

## 2018-12-17 MED ORDER — DOCUSATE CALCIUM 240 MG PO CAPS: 240.0000 mg | ORAL_CAPSULE | Freq: Every day | ORAL | 2 refills | Status: DC

## 2018-12-17 NOTE — Patient Instructions (Signed)
Second Trimester of Pregnancy  The second trimester is from week 14 through week 27 (month 4 through 6). This is often the time in pregnancy that you feel your best. Often times, morning sickness has lessened or quit. You may have more energy, and you may get hungry more often. Your unborn baby is growing rapidly. At the end of the sixth month, he or she is about 9 inches long and weighs about 1 pounds. You will likely feel the baby move between 18 and 20 weeks of pregnancy. Follow these instructions at home: Medicines  Take over-the-counter and prescription medicines only as told by your doctor. Some medicines are safe and some medicines are not safe during pregnancy.  Take a prenatal vitamin that contains at least 600 micrograms (mcg) of folic acid.  If you have trouble pooping (constipation), take medicine that will make your stool soft (stool softener) if your doctor approves. Eating and drinking   Eat regular, healthy meals.  Avoid raw meat and uncooked cheese.  If you get low calcium from the food you eat, talk to your doctor about taking a daily calcium supplement.  Avoid foods that are high in fat and sugars, such as fried and sweet foods.  If you feel sick to your stomach (nauseous) or throw up (vomit): ? Eat 4 or 5 small meals a day instead of 3 large meals. ? Try eating a few soda crackers. ? Drink liquids between meals instead of during meals.  To prevent constipation: ? Eat foods that are high in fiber, like fresh fruits and vegetables, whole grains, and beans. ? Drink enough fluids to keep your pee (urine) clear or pale yellow. Activity  Exercise only as told by your doctor. Stop exercising if you start to have cramps.  Do not exercise if it is too hot, too humid, or if you are in a place of great height (high altitude).  Avoid heavy lifting.  Wear low-heeled shoes. Sit and stand up straight.  You can continue to have sex unless your doctor tells you not to.  Relieving pain and discomfort  Wear a good support bra if your breasts are tender.  Take warm water baths (sitz baths) to soothe pain or discomfort caused by hemorrhoids. Use hemorrhoid cream if your doctor approves.  Rest with your legs raised if you have leg cramps or low back pain.  If you develop puffy, bulging veins (varicose veins) in your legs: ? Wear support hose or compression stockings as told by your doctor. ? Raise (elevate) your feet for 15 minutes, 3-4 times a day. ? Limit salt in your food. Prenatal care  Write down your questions. Take them to your prenatal visits.  Keep all your prenatal visits as told by your doctor. This is important. Safety  Wear your seat belt when driving.  Make a list of emergency phone numbers, including numbers for family, friends, the hospital, and police and fire departments. General instructions  Ask your doctor about the right foods to eat or for help finding a counselor, if you need these services.  Ask your doctor about local prenatal classes. Begin classes before month 6 of your pregnancy.  Do not use hot tubs, steam rooms, or saunas.  Do not douche or use tampons or scented sanitary pads.  Do not cross your legs for long periods of time.  Visit your dentist if you have not done so. Use a soft toothbrush to brush your teeth. Floss gently.  Avoid all smoking, herbs,   and alcohol. Avoid drugs that are not approved by your doctor.  Do not use any products that contain nicotine or tobacco, such as cigarettes and e-cigarettes. If you need help quitting, ask your doctor.  Avoid cat litter boxes and soil used by cats. These carry germs that can cause birth defects in the baby and can cause a loss of your baby (miscarriage) or stillbirth. Contact a doctor if:  You have mild cramps or pressure in your lower belly.  You have pain when you pee (urinate).  You have bad smelling fluid coming from your vagina.  You continue to feel  sick to your stomach (nauseous), throw up (vomit), or have watery poop (diarrhea).  You have a nagging pain in your belly area.  You feel dizzy. Get help right away if:  You have a fever.  You are leaking fluid from your vagina.  You have spotting or bleeding from your vagina.  You have severe belly cramping or pain.  You lose or gain weight rapidly.  You have trouble catching your breath and have chest pain.  You notice sudden or extreme puffiness (swelling) of your face, hands, ankles, feet, or legs.  You have not felt the baby move in over an hour.  You have severe headaches that do not go away when you take medicine.  You have trouble seeing. Summary  The second trimester is from week 14 through week 27 (months 4 through 6). This is often the time in pregnancy that you feel your best.  To take care of yourself and your unborn baby, you will need to eat healthy meals, take medicines only if your doctor tells you to do so, and do activities that are safe for you and your baby.  Call your doctor if you get sick or if you notice anything unusual about your pregnancy. Also, call your doctor if you need help with the right food to eat, or if you want to know what activities are safe for you. This information is not intended to replace advice given to you by your health care provider. Make sure you discuss any questions you have with your health care provider. Document Released: 06/27/2009 Document Revised: 07/25/2018 Document Reviewed: 05/08/2016 Elsevier Patient Education  2020 Else[employer]  Morning Sickness  Morning sickness is when you feel sick to your stomach (nauseous) during pregnancy. You may feel sick to your stomach and throw up (vomit). You may feel sick in the morning, but you can feel this way at any time of day. Some women feel very sick to their stomach and cannot stop throwing up (hyperemesis gravidarum). Follow these instructions at home: Medicines  Take  over-the-counter and prescription medicines only as told by your doctor. Do not take any medicines until you talk with your doctor about them first.  Taking multivitamins before getting pregnant can stop or lessen the harshness of morning sickness. Eating and drinking  Eat dry toast or crackers before getting out of bed.  Eat 5 or 6 small meals a day.  Eat dry and bland foods like rice and baked potatoes.  Do not eat greasy, fatty, or spicy foods.  Have someone cook for you if the smell of food causes you to feel sick or throw up.  If you feel sick to your stomach after taking prenatal vitamins, take them at night or with a snack.  Eat protein when you need a snack. Nuts, yogurt, and cheese are good choices.  Drink fluids throughout the day.  Try ginger ale made with real ginger, ginger tea made from fresh grated ginger, or ginger candies. General instructions  Do not use any products that have nicotine or tobacco in them, such as cigarettes and e-cigarettes. If you need help quitting, ask your doctor.  Use an air purifier to keep the air in your house free of smells.  Get lots of fresh air.  Try to avoid smells that make you feel sick.  Try: ? Wearing a bracelet that is used for seasickness (acupressure wristband). ? Going to a doctor who puts thin needles into certain body points (acupuncture) to improve how you feel. Contact a doctor if:  You need medicine to feel better.  You feel dizzy or light-headed.  You are losing weight. Get help right away if:  You feel very sick to your stomach and cannot stop throwing up.  You pass out (faint).  You have very bad pain in your belly. Summary  Morning sickness is when you feel sick to your stomach (nauseous) during pregnancy.  You may feel sick in the morning, but you can feel this way at any time of day.  Making some changes to what you eat may help your symptoms go away. This information is not intended to replace  advice given to you by your health care provider. Make sure you discuss any questions you have with your health care provider. Document Released: 05/10/2004 Document Revised: 03/15/2017 Document Reviewed: 05/03/2016 Elsevier Patient Education  [place].  Constipation, Adult Constipation is when a person:  Poops (has a bowel movement) fewer times in a week than normal.  Has a hard time pooping.  Has poop that is dry, hard, or bigger than normal. Follow these instructions at home: Eating and drinking   Eat foods that have a lot of fiber, such as: ? Fresh fruits and vegetables. ? Whole grains. ? Beans.  Eat less of foods that are high in fat, low in fiber, or overly processed, such as: ? [place] fries. ? Hamburgers. ? Cookies. ? Candy. ? Soda.  Drink enough fluid to keep your pee (urine) clear or pale yellow. General instructions  Exercise regularly or as told by your doctor.  Go to the restroom when you feel like you need to poop. Do not hold it in.  Take over-the-counter and prescription medicines only as told by your doctor. These include any fiber supplements.  Do pelvic floor retraining exercises, such as: ? Doing deep breathing while relaxing your lower belly (abdomen). ? Relaxing your pelvic floor while pooping.  Watch your condition for any changes.  Keep all follow-up visits as told by your doctor. This is important. Contact a doctor if:  You have pain that gets worse.  You have a fever.  You have not pooped for 4 days.  You throw up (vomit).  You are not hungry.  You lose weight.  You are bleeding from the anus.  You have thin, pencil-like poop (stool). Get help right away if:  You have a fever, and your symptoms suddenly get worse.  You leak poop or have blood in your poop.  Your belly feels hard or bigger than normal (is bloated).  You have very bad belly pain.  You feel dizzy or you faint. This information is not intended to  replace advice given to you by your health care provider. Make sure you discuss any questions you have with your health care provider. Document Released: 09/19/2007 Document Revised: 03/15/2017 Document Reviewed: 09/21/2015 Elsevier  Patient Education  [employer].

## 2018-12-17 NOTE — Progress Notes (Signed)
Subjective:   Brittany Quinn is a 28 y.o. G3P0020 at 37w0dby LMP being seen today for her first obstetrical visit.  Her obstetrical history is significant for obesity and former smoker, previous ectopic pregnancy. Patient does intend to breast feed. Pregnancy history fully reviewed.  Patient reports nausea and constipation. Vomits 1-2 times a week but has nausea daily.  HISTORY: OB History  Gravida Para Term Preterm AB Living  3 0 0 0 2 0  SAB TAB Ectopic Multiple Live Births  1 0 1 0 0    # Outcome Date GA Lbr Len/2nd Weight Sex Delivery Anes PTL Lv  3 Current           2 Ectopic              Birth Comments: System Generated. Please review and update pregnancy details.  1 SAB            Past Medical History:  Diagnosis Date  . Medical history non-contributory   . Trichomonas    Past Surgical History:  Procedure Laterality Date  . LAPAROSCOPY  06/23/2011   Procedure: LAPAROSCOPY OPERATIVE;  Surgeon: UOsborne Oman MD;  [facility];  Service: Gynecology;  Laterality: Left;  Diagnostic laparoscopy  . LAPAROTOMY  06/23/2011   Procedure: LAPAROTOMY;  Surgeon: UOsborne Oman MD;  [facility];  Service: Gynecology;  Laterality: Left;  Wedge resection of Ruptured left cornual ectopic pregnancy with left salpingectomy   Family History  Problem Relation Age of Onset  . Breast cancer Mother   . Cancer Father        prostate  . Diabetes Father    Social History   Tobacco Use  . Smoking status: Former Smoker    Types: Cigars    Quit date: 09/01/2018    Years since quitting: 0.2  . Smokeless tobacco: Never Used  . Tobacco comment: Blacks  Substance Use Topics  . Alcohol use: Not Currently    Comment: occ  . Drug use: Yes    Types: Marijuana    Comment: last use May 2020   No Known Allergies Current Outpatient Medications on File Prior to Visit  Medication Sig Dispense Refill  . Blood Pressure Monitoring (BLOOD PRESSURE KIT) DEVI 1 Device by Does not apply  route as needed. ICD 10 Z34.90 1 Device 0  . cetirizine (ZYRTEC) 10 MG tablet Take 10 mg by mouth daily.    . Prenatal Vit-Fe Fumarate-FA (PRENATAL VITAMIN) 27-0.8 MG TABS Take 1 tablet by mouth daily. 30 tablet 12  . Prenatal Vit-Fe Fumarate-FA (PRENATAL VITAMINS) 28-0.8 MG TABS TK 1 T PO D     No current facility-administered medications on file prior to visit.      Exam   Vitals:   12/17/18 1008  BP: 117/75  Pulse: 86  Weight: 202 lb (91.6 kg)   Fetal Heart Rate (bpm): 157  Uterus:  Fundal Height: 18 cm  Pelvic Exam: Perineum: no hemorrhoids, normal perineum   Vulva: normal external genitalia, no lesions   Vagina:  normal mucosa, normal discharge   Cervix: no lesions and normal, pap smear not done.    Adnexa: normal adnexa and no mass, fullness, tenderness   Bony Pelvis: average  System: General: well-developed, well-nourished female in no acute distress   Breast:  normal appearance, no masses or tenderness   Skin: normal coloration and turgor, no rashes   Neurologic: oriented, normal, negative, normal mood   Extremities: normal strength, tone,  and muscle mass, ROM of all joints is normal   HEENT extraocular movement intact and sclera clear, anicteric   Mouth/Teeth mucous membranes moist, pharynx normal without lesions and dental hygiene good   Neck supple and no masses, normal thyroid   Cardiovascular: regular rate and rhythm   Respiratory:  no respiratory distress, normal breath sounds   Abdomen: soft, non-tender; no masses,  no organomegaly     Assessment:   Pregnancy: [id number] Patient Active Problem List   Diagnosis Date Noted  . BMI 34.0-34.9,adult 12/17/2018  . Former smoker 12/17/2018  . Supervision of low-risk pregnancy 12/02/2018  . Pregnancy with history of ectopic pregnancy, antepartum 12/02/2018     Plan:  1. Encounter for supervision of low-risk pregnancy, antepartum Reviewed the importance of childbirth classes online and breastfeeding classes later  in the pregnancy. Prescribed gummy PNV - others are making her nauseated. Pap due 01-2020  - Hemoglobin A1c - Obstetric Panel, Including HIV - Culture, OB Urine  2. BMI 34.0-34.9,adult Advised 11-20 pound weight gain this pregnancy  3.  Morning sickness Prescribed Diclegis and discussed how to begin and increase as needed to maximum dose.  4.  Constipation Advised 64 ounces of water daily Advised high fiber diet  5.  Former Smoker Stopped smoking the week before her positive pregnancy test  Initial labs drawn. Continue prenatal vitamins. Genetic Screening discussed, horizon discussed: ordered. Ultrasound discussed; fetal anatomic survey: ordered. Problem list reviewed and updated. The nature of [place] with multiple MDs and other Advanced Practice Providers was explained to patient; also emphasized that residents, students are part of our team. Routine obstetric precautions reviewed. Return in about 4 weeks (around 01/14/2019) for in person visit  - client declines virtual visits.  Total face-to-face time with patient: 40 minutes.  Over 50% of encounter was spent on counseling and coordination of care.     Earlie Server, FNP Family Nurse Practitioner, [facility] for [employer] 12/17/2018 8:38 PM

## 2018-12-17 NOTE — Progress Notes (Signed)
2018 PapSmear Normal Gave BP Cuff and Teaching completed Medicaid home form completed today Offered flu shot got it at work @[employer]  [facility].

## 2018-12-18 LAB — OBSTETRIC PANEL, INCLUDING HIV
Antibody Screen: NEGATIVE
Basophils Absolute: 0 10*3/uL (ref 0.0–0.2)
Basos: 0 %
EOS (ABSOLUTE): 0.1 10*3/uL (ref 0.0–0.4)
Eos: 1 %
HIV Screen 4th Generation wRfx: NONREACTIVE
Hematocrit: 38.4 % (ref 34.0–46.6)
Hemoglobin: 13 g/dL (ref 11.1–15.9)
Hepatitis B Surface Ag: NEGATIVE
Immature Grans (Abs): 0 10*3/uL (ref 0.0–0.1)
Immature Granulocytes: 0 %
Lymphocytes Absolute: 2.3 10*3/uL (ref 0.7–3.1)
Lymphs: 20 %
MCH: 32.3 pg (ref 26.6–33.0)
MCHC: 33.9 g/dL (ref 31.5–35.7)
MCV: 95 fL (ref 79–97)
Monocytes Absolute: 0.9 10*3/uL (ref 0.1–0.9)
Monocytes: 7 %
Neutrophils Absolute: 8.5 10*3/uL — ABNORMAL HIGH (ref 1.4–7.0)
Neutrophils: 72 %
Platelets: 272 10*3/uL (ref 150–450)
RBC: 4.03 x10E6/uL (ref 3.77–5.28)
RDW: 12.4 % (ref 11.7–15.4)
RPR Ser Ql: NONREACTIVE
Rh Factor: POSITIVE
Rubella Antibodies, IGG: 2.96 index (ref >0.99)
WBC: 11.8 10*3/uL — ABNORMAL HIGH (ref 3.4–10.8)

## 2018-12-18 LAB — HEMOGLOBIN A1C
Est. average glucose Bld gHb Est-mCnc: 100 mg/dL
Hgb A1c MFr Bld: 5.1 % (ref 4.8–5.6)

## 2018-12-19 LAB — CULTURE, OB URINE

## 2018-12-19 LAB — URINE CULTURE, OB REFLEX: Organism ID, Bacteria: NO GROWTH

## 2018-12-23 ENCOUNTER — Encounter: Payer: Self-pay | Admitting: *Deleted

## 2018-12-26 ENCOUNTER — Encounter: Payer: Self-pay | Admitting: *Deleted

## 2019-01-07 ENCOUNTER — Other Ambulatory Visit (HOSPITAL_COMMUNITY): Payer: Self-pay | Admitting: *Deleted

## 2019-01-07 ENCOUNTER — Other Ambulatory Visit: Payer: Self-pay

## 2019-01-07 ENCOUNTER — Ambulatory Visit (HOSPITAL_COMMUNITY)
Admission: RE | Admit: 2019-01-07 | Discharge: 2019-01-07 | Disposition: A | Discharge status: Home / Self Care | Payer: Medicaid Other | Source: Ambulatory Visit | Attending: Obstetrics and Gynecology | Admitting: Obstetrics and Gynecology

## 2019-01-07 ENCOUNTER — Other Ambulatory Visit: Payer: Self-pay | Admitting: Nurse Practitioner

## 2019-01-07 DIAGNOSIS — Z3A17 17 weeks gestation of pregnancy: Secondary | ICD-10-CM

## 2019-01-07 DIAGNOSIS — Z3490 Encounter for supervision of normal pregnancy, unspecified, unspecified trimester: Secondary | ICD-10-CM

## 2019-01-07 DIAGNOSIS — O99212 Obesity complicating pregnancy, second trimester: Secondary | ICD-10-CM | POA: Diagnosis not present

## 2019-01-07 DIAGNOSIS — Z3687 Encounter for antenatal screening for uncertain dates: Secondary | ICD-10-CM

## 2019-01-07 DIAGNOSIS — Z362 Encounter for other antenatal screening follow-up: Secondary | ICD-10-CM

## 2019-01-12 ENCOUNTER — Telehealth: Payer: Self-pay | Admitting: Student

## 2019-01-12 NOTE — Telephone Encounter (Signed)
Spoke with patient about her appointment on 9/29 @ 8:55. Patient instructed that the appointment is a mychart visit. Patient instructed to download the mychart app if not already done so. Patient verbalized she has the app downloaded. Patient instructed that a nurse will be calling her around her appointment time.

## 2019-01-13 ENCOUNTER — Other Ambulatory Visit: Payer: Self-pay

## 2019-01-13 ENCOUNTER — Telehealth (INDEPENDENT_AMBULATORY_CARE_PROVIDER_SITE_OTHER): Payer: Medicaid Other | Admitting: Student

## 2019-01-13 DIAGNOSIS — O0912 Supervision of pregnancy with history of ectopic or molar pregnancy, second trimester: Secondary | ICD-10-CM

## 2019-01-13 DIAGNOSIS — Z3492 Encounter for supervision of normal pregnancy, unspecified, second trimester: Secondary | ICD-10-CM

## 2019-01-13 DIAGNOSIS — O0910 Supervision of pregnancy with history of ectopic or molar pregnancy, unspecified trimester: Secondary | ICD-10-CM

## 2019-01-13 DIAGNOSIS — Z3A17 17 weeks gestation of pregnancy: Secondary | ICD-10-CM

## 2019-01-13 NOTE — Patient Instructions (Signed)

## 2019-01-13 NOTE — Progress Notes (Signed)
Patient ID: Brittany Quinn, female   DOB: [birth year], 28 y.o.   MRN: [id number]  I connected with@ on 01/13/19 at  8:55 AM EDT by: My Chart and verified that I am speaking with the correct person using two identifiers.  Patient is located at home and provider is located at [place].     The purpose of this virtual visit is to provide medical care while limiting exposure to the novel coronavirus. I discussed the limitations, risks, security and privacy concerns of performing an evaluation and management service by My chart and the availability of in person appointments. I also discussed with the patient that there may be a patient responsible charge related to this service. By engaging in this virtual visit, you consent to the provision of healthcare.  Additionally, you authorize for your insurance to be billed for the services provided during this visit.  The patient expressed understanding and agreed to proceed.  The following staff members participated in the virtual visit:  Lowanda Foster    PRENATAL VISIT NOTE  Subjective:  Brittany Quinn is a 28 y.o. G3P0020 at [redacted]w[redacted]d  for phone visit for ongoing prenatal care.  She is currently monitored for the following issues for this low-risk pregnancy and has Supervision of low-risk pregnancy; Pregnancy with history of ectopic pregnancy, antepartum; BMI 34.0-34.9,adult; and Former smoker on their problem list.  Patient reports no complaints.  Contractions: Not present. Vag. Bleeding: None.  Movement: Absent. Denies leaking of fluid.   The following portions of the patient's history were reviewed and updated as appropriate: allergies, current medications, past family history, past medical history, past social history, past surgical history and problem list.   Objective:   Vitals:   01/13/19 0858  BP: 115/68  Pulse: 94  Weight: 203 lb 4.8 oz (92.2 kg)   Self-Obtained  Fetal Status:     Movement: Absent     Assessment and Plan:  Pregnancy: G3P0020 at [redacted]w[redacted]d 1.  Encounter for supervision of low-risk pregnancy in second trimester -Bps reviewed, patient feels good using BabyRx and uploading her bps.  -No questions today -Wants out patient circ -has follow up anatomy scan ordered.  -Looked up genetic testing restuls on [employer] website: normal Horizon and [place], female fetus.  2. Pregnancy with history of ectopic pregnancy, antepartum   Preterm labor symptoms and general obstetric precautions including but not limited to vaginal bleeding, contractions, leaking of fluid and fetal movement were reviewed in detail with the patient.  Return in about 4 weeks (around 02/10/2019), or LROB My chart visit.  Future Appointments  Date Time Provider [place]  02/04/2019  9:45 AM WH-MFC [place] 2 WH-MFCUS MFC-US     Time spent on virtual visit: 9 minutes  Starr [place], CNM

## 2019-01-13 NOTE — Progress Notes (Signed)
I connected with  Shawna Orleans on 01/13/19 at  8:55 AM EDT by telephone and verified that I am speaking with the correct person using two identifiers.   I discussed the limitations, risks, security and privacy concerns of performing an evaluation and management service by telephone and the availability of in person appointments. I also discussed with the patient that there may be a patient responsible charge related to this service. The patient expressed understanding and agreed to proceed.  [place], [place] 01/13/2019  8:58 AM

## 2019-01-19 ENCOUNTER — Encounter: Payer: Self-pay | Admitting: *Deleted

## 2019-01-22 ENCOUNTER — Encounter: Payer: Self-pay | Admitting: *Deleted

## 2019-02-04 ENCOUNTER — Other Ambulatory Visit (HOSPITAL_COMMUNITY): Payer: Self-pay | Admitting: *Deleted

## 2019-02-04 ENCOUNTER — Other Ambulatory Visit: Payer: Self-pay

## 2019-02-04 ENCOUNTER — Ambulatory Visit (HOSPITAL_COMMUNITY)
Admission: RE | Admit: 2019-02-04 | Discharge: 2019-02-04 | Disposition: A | Discharge status: Home / Self Care | Payer: Medicaid Other | Source: Ambulatory Visit | Attending: Maternal & Fetal Medicine | Admitting: Maternal & Fetal Medicine

## 2019-02-04 DIAGNOSIS — O4442 Low lying placenta NOS or without hemorrhage, second trimester: Secondary | ICD-10-CM

## 2019-02-04 DIAGNOSIS — Z3A21 21 weeks gestation of pregnancy: Secondary | ICD-10-CM

## 2019-02-04 DIAGNOSIS — O4440 Low lying placenta NOS or without hemorrhage, unspecified trimester: Secondary | ICD-10-CM

## 2019-02-04 DIAGNOSIS — Z362 Encounter for other antenatal screening follow-up: Secondary | ICD-10-CM | POA: Diagnosis present

## 2019-03-02 NOTE — Progress Notes (Unsigned)
Pt sent message stating that she is concerned that she has not been scheduled for another visit with [place] since 01/13/19, In [place] notes, it's states to schedule pt in about 4 weeks & for her to return 02/10/19 or LROB My Chart visit.

## 2019-03-03 ENCOUNTER — Encounter: Payer: Self-pay | Admitting: Advanced Practice Midwife

## 2019-03-03 ENCOUNTER — Telehealth (INDEPENDENT_AMBULATORY_CARE_PROVIDER_SITE_OTHER): Payer: Medicaid Other | Admitting: Advanced Practice Midwife

## 2019-03-03 DIAGNOSIS — Z3A24 24 weeks gestation of pregnancy: Secondary | ICD-10-CM

## 2019-03-03 DIAGNOSIS — Z3490 Encounter for supervision of normal pregnancy, unspecified, unspecified trimester: Secondary | ICD-10-CM

## 2019-03-03 DIAGNOSIS — O0910 Supervision of pregnancy with history of ectopic or molar pregnancy, unspecified trimester: Secondary | ICD-10-CM

## 2019-03-03 DIAGNOSIS — O0912 Supervision of pregnancy with history of ectopic or molar pregnancy, second trimester: Secondary | ICD-10-CM | POA: Diagnosis not present

## 2019-03-03 NOTE — Progress Notes (Signed)
I connected with  Brittany Quinn on 03/03/19 at  1:35 PM EST by telephone and verified that I am speaking with the correct person using two identifiers.   I discussed the limitations, risks, security and privacy concerns of performing an evaluation and management service by telephone and the availability of in person appointments. I also discussed with the patient that there may be a patient responsible charge related to this service. The patient expressed understanding and agreed to proceed.  Pt does not have BP cuff with her at this time. Requested pt check BP when she is able and log in Babyscripts. Pt verbalizes understanding.  Annabell Howells, RN 03/03/2019  1:23 PM

## 2019-03-03 NOTE — Progress Notes (Signed)
   TELEHEALTH VIRTUAL OBSTETRICS VISIT ENCOUNTER NOTE  I connected with Brittany Quinn on 03/03/19 at  1:35 PM EST by telephone at home and verified that I am speaking with the correct person using two identifiers.   I discussed the limitations, risks, security and privacy concerns of performing an evaluation and management service by telephone and the availability of in person appointments. I also discussed with the patient that there may be a patient responsible charge related to this service. The patient expressed understanding and agreed to proceed.  Subjective:  Brittany Quinn is a 28 y.o. G3P0020 at [redacted]w[redacted]d being followed for ongoing prenatal care.  She is currently monitored for the following issues for this low-risk pregnancy and has Supervision of low-risk pregnancy; Pregnancy with history of ectopic pregnancy, antepartum; BMI 34.0-34.9,adult; and Former smoker on their problem list.  Patient reports carpal tunnel symptoms. Reports fetal movement. Denies any contractions, bleeding or leaking of fluid.   The following portions of the patient's history were reviewed and updated as appropriate: allergies, current medications, past family history, past medical history, past social history, past surgical history and problem list.   Objective:   General:  Alert, oriented and cooperative.   Mental Status: Normal mood and affect perceived. Normal judgment and thought content.  Rest of physical exam deferred due to type of encounter  Assessment and Plan:  Pregnancy: G3P0020 at [redacted]w[redacted]d 1. Encounter for supervision of low-risk pregnancy, antepartum - routine care - CBC; Future - HIV antibody; Future - RPR; Future - Glucose Tolerance, 2 Hours w/1 Hour; Future    Preterm labor symptoms and general obstetric precautions including but not limited to vaginal bleeding, contractions, leaking of fluid and fetal movement were reviewed in detail with the patient.  I discussed the assessment and treatment  plan with the patient. The patient was provided an opportunity to ask questions and all were answered. The patient agreed with the plan and demonstrated an understanding of the instructions. The patient was advised to call back or seek an in-person office evaluation/go to MAU at [facility] for any urgent or concerning symptoms. Please refer to After Visit Summary for other counseling recommendations.   I provided 10 minutes of non-face-to-face time during this encounter.  Return in about 4 weeks (around 03/31/2019) for in person visit for 28 week labs and glucose tolerance test .  Future Appointments  Date Time Provider [place]  04/01/2019 10:15 AM WH-MFC [place] 4 WH-MFCUS MFC-US    Adelheid Hoggard DNP, CNM  03/03/19  1:43 PM  Center for [place]

## 2019-03-31 ENCOUNTER — Other Ambulatory Visit: Payer: Medicaid Other

## 2019-03-31 ENCOUNTER — Other Ambulatory Visit: Payer: Self-pay

## 2019-03-31 ENCOUNTER — Ambulatory Visit (INDEPENDENT_AMBULATORY_CARE_PROVIDER_SITE_OTHER): Payer: Medicaid Other | Admitting: Student

## 2019-03-31 DIAGNOSIS — Z3490 Encounter for supervision of normal pregnancy, unspecified, unspecified trimester: Secondary | ICD-10-CM

## 2019-03-31 DIAGNOSIS — Z3493 Encounter for supervision of normal pregnancy, unspecified, third trimester: Secondary | ICD-10-CM

## 2019-03-31 DIAGNOSIS — Z3A28 28 weeks gestation of pregnancy: Secondary | ICD-10-CM

## 2019-03-31 NOTE — Progress Notes (Signed)
   PRENATAL VISIT NOTE  Subjective:  Brittany Quinn is a 28 y.o. G3P0020 at [redacted]w[redacted]d being seen today for ongoing prenatal care.  She is currently monitored for the following issues for this low-risk pregnancy and has Supervision of low-risk pregnancy; Pregnancy with history of ectopic pregnancy, antepartum; BMI 34.0-34.9,adult; and Former smoker on their problem list.  Patient reports no complaints. Is checking her BP and logging it into BabyRx.  Contractions: Not present. Vag. Bleeding: None.  Movement: Present. Denies leaking of fluid.   The following portions of the patient's history were reviewed and updated as appropriate: allergies, current medications, past family history, past medical history, past social history, past surgical history and problem list.   Objective:   Vitals:   03/31/19 1038  BP: 115/75  Pulse: (!) 118  Weight: 212 lb 8 oz (96.4 kg)    Fetal Status: Fetal Heart Rate (bpm): 143 Fundal Height: 30 cm Movement: Present     General:  Alert, oriented and cooperative. Patient is in no acute distress.  Skin: Skin is warm and dry. No rash noted.   Cardiovascular: Normal heart rate noted  Respiratory: Normal respiratory effort, no problems with respiration noted  Abdomen: Soft, gravid, appropriate for gestational age.  Pain/Pressure: Absent     Pelvic: Cervical exam deferred        Extremities: Normal range of motion.  Edema: None  Mental Status: Normal mood and affect. Normal behavior. Normal judgment and thought content.   Assessment and Plan:  Pregnancy: [id number] at [redacted]w[redacted]d 1. Encounter for supervision of low-risk pregnancy in third trimester -Reviewed BPs today; they are normal.  - no questions today; 2 hour GTT in process.   Preterm labor symptoms and general obstetric precautions including but not limited to vaginal bleeding, contractions, leaking of fluid and fetal movement were reviewed in detail with the patient. Please refer to After Visit Summary for other  counseling recommendations.   Return in about 4 weeks (around 04/28/2019), or on my chart for LROB.  Future Appointments  Date Time Provider [place]  04/01/2019 10:15 AM WH-MFC [place] Sharice Harriss, [place]

## 2019-04-01 ENCOUNTER — Ambulatory Visit (HOSPITAL_COMMUNITY)
Admission: RE | Admit: 2019-04-01 | Discharge: 2019-04-01 | Disposition: A | Discharge status: Home / Self Care | Payer: Medicaid Other | Source: Ambulatory Visit | Attending: Obstetrics and Gynecology | Admitting: Obstetrics and Gynecology

## 2019-04-01 ENCOUNTER — Other Ambulatory Visit (HOSPITAL_COMMUNITY): Payer: Self-pay | Admitting: *Deleted

## 2019-04-01 DIAGNOSIS — Z3A29 29 weeks gestation of pregnancy: Secondary | ICD-10-CM | POA: Diagnosis not present

## 2019-04-01 DIAGNOSIS — O4440 Low lying placenta NOS or without hemorrhage, unspecified trimester: Secondary | ICD-10-CM

## 2019-04-01 DIAGNOSIS — O4443 Low lying placenta NOS or without hemorrhage, third trimester: Secondary | ICD-10-CM | POA: Diagnosis not present

## 2019-04-01 DIAGNOSIS — Z362 Encounter for other antenatal screening follow-up: Secondary | ICD-10-CM

## 2019-04-01 LAB — CBC
Hematocrit: 32.5 % — ABNORMAL LOW (ref 34.0–46.6)
Hemoglobin: 11.1 g/dL (ref 11.1–15.9)
MCH: 32.5 pg (ref 26.6–33.0)
MCHC: 34.2 g/dL (ref 31.5–35.7)
MCV: 95 fL (ref 79–97)
Platelets: 230 10*3/uL (ref 150–450)
RBC: 3.42 x10E6/uL — ABNORMAL LOW (ref 3.77–5.28)
RDW: 12.3 % (ref 11.7–15.4)
WBC: 10.7 10*3/uL (ref 3.4–10.8)

## 2019-04-01 LAB — GLUCOSE TOLERANCE, 2 HOURS W/ 1HR
Glucose, 1 hour: 125 mg/dL (ref 65–179)
Glucose, 2 hour: 121 mg/dL (ref 65–152)
Glucose, Fasting: 87 mg/dL (ref 65–91)

## 2019-04-01 LAB — HIV ANTIBODY (ROUTINE TESTING W REFLEX): HIV Screen 4th Generation wRfx: NONREACTIVE

## 2019-04-01 LAB — RPR: RPR Ser Ql: NONREACTIVE

## 2019-04-27 ENCOUNTER — Telehealth (INDEPENDENT_AMBULATORY_CARE_PROVIDER_SITE_OTHER): Payer: Medicaid Other | Admitting: Obstetrics and Gynecology

## 2019-04-27 ENCOUNTER — Encounter: Payer: Self-pay | Admitting: Obstetrics and Gynecology

## 2019-04-27 DIAGNOSIS — O4443 Low lying placenta NOS or without hemorrhage, third trimester: Secondary | ICD-10-CM

## 2019-04-27 DIAGNOSIS — O4440 Low lying placenta NOS or without hemorrhage, unspecified trimester: Secondary | ICD-10-CM | POA: Insufficient documentation

## 2019-04-27 DIAGNOSIS — Z3A32 32 weeks gestation of pregnancy: Secondary | ICD-10-CM

## 2019-04-27 DIAGNOSIS — Z3493 Encounter for supervision of normal pregnancy, unspecified, third trimester: Secondary | ICD-10-CM

## 2019-04-27 NOTE — Progress Notes (Signed)
I connected with  Levan Hurst on 04/27/19 at 11:15 AM EST by telephone and verified that I am speaking with the correct person using two identifiers.   I discussed the limitations, risks, security and privacy concerns of performing an evaluation and management service by telephone and the availability of in person appointments. I also discussed with the patient that there may be a patient responsible charge related to this service. The patient expressed understanding and agreed to proceed.  Henrietta Dine, CMA 04/27/2019  11:05 AM

## 2019-04-27 NOTE — Progress Notes (Signed)
   TELEHEALTH OBSTETRICS PRENATAL VIRTUAL VIDEO VISIT ENCOUNTER NOTE  Provider location: Center for [employer] at [place]   I connected with Levan Hurst on 04/27/19 at 11:15 AM EST by MyChart Video Encounter at home and verified that I am speaking with the correct person using two identifiers.   I discussed the limitations, risks, security and privacy concerns of performing an evaluation and management service virtually and the availability of in person appointments. I also discussed with the patient that there may be a patient responsible charge related to this service. The patient expressed understanding and agreed to proceed. Subjective:  Brittany Quinn is a 29 y.o. G3P0020 at [redacted]w[redacted]d being seen today for ongoing prenatal care.  She is currently monitored for the following issues for this low-risk pregnancy and has Supervision of low-risk pregnancy; Pregnancy with history of ectopic pregnancy, antepartum; BMI 34.0-34.9,adult; Former smoker; and Low-lying placenta on their problem list.  Patient reports no complaints.  Contractions: Not present. Vag. Bleeding: None.  Movement: Present. Denies any leaking of fluid.   The following portions of the patient's history were reviewed and updated as appropriate: allergies, current medications, past family history, past medical history, past social history, past surgical history and problem list.   Objective:   Vitals:   04/27/19 1108  BP: 134/77  Pulse: 93   Fetal Status:     Movement: Present     General:  Alert, oriented and cooperative. Patient is in no acute distress.  Respiratory: Normal respiratory effort, no problems with respiration noted  Mental Status: Normal mood and affect. Normal behavior. Normal judgment and thought content.  Rest of physical exam deferred due to type of encounter  Assessment and Plan:  Pregnancy: G3P0020 at [redacted]w[redacted]d  1. Encounter for supervision of low-risk pregnancy in third trimester No issues  2. Low-lying  placenta Placenta 1.2 cm from os Has f/u 04/29/19 instructed to go to hospital with any issues  Preterm labor symptoms and general obstetric precautions including but not limited to vaginal bleeding, contractions, leaking of fluid and fetal movement were reviewed in detail with the patient. I discussed the assessment and treatment plan with the patient. The patient was provided an opportunity to ask questions and all were answered. The patient agreed with the plan and demonstrated an understanding of the instructions. The patient was advised to call back or seek an in-person office evaluation/go to MAU at [facility] for any urgent or concerning symptoms. Please refer to After Visit Summary for other counseling recommendations.   I provided 15 minutes of face-to-face time during this encounter.  Return in about 2 weeks (around 05/11/2019) for virtual, high OB.  Future Appointments  Date Time Provider Department Center  04/29/2019  9:15 AM WH-MFC NURSE WH-MFC MFC-US  04/29/2019  9:15 AM WH-MFC [place] 4 WH-MFCUS MFC-US    Conan Bowens, MD Center for [facility], [facility]

## 2019-04-28 ENCOUNTER — Telehealth: Payer: Medicaid Other | Admitting: Student

## 2019-04-29 ENCOUNTER — Encounter (HOSPITAL_COMMUNITY): Payer: Self-pay

## 2019-04-29 ENCOUNTER — Ambulatory Visit (HOSPITAL_COMMUNITY)
Admission: RE | Admit: 2019-04-29 | Discharge: 2019-04-29 | Disposition: A | Discharge status: Home / Self Care | Payer: Medicaid Other | Source: Ambulatory Visit | Attending: Obstetrics and Gynecology | Admitting: Obstetrics and Gynecology

## 2019-04-29 ENCOUNTER — Ambulatory Visit (HOSPITAL_COMMUNITY): Payer: Medicaid Other | Admitting: *Deleted

## 2019-04-29 ENCOUNTER — Other Ambulatory Visit: Payer: Self-pay

## 2019-04-29 ENCOUNTER — Other Ambulatory Visit (HOSPITAL_COMMUNITY): Payer: Self-pay | Admitting: *Deleted

## 2019-04-29 DIAGNOSIS — Z3A33 33 weeks gestation of pregnancy: Secondary | ICD-10-CM | POA: Diagnosis not present

## 2019-04-29 DIAGNOSIS — O0910 Supervision of pregnancy with history of ectopic or molar pregnancy, unspecified trimester: Secondary | ICD-10-CM

## 2019-04-29 DIAGNOSIS — O4440 Low lying placenta NOS or without hemorrhage, unspecified trimester: Secondary | ICD-10-CM

## 2019-04-29 DIAGNOSIS — O0913 Supervision of pregnancy with history of ectopic or molar pregnancy, third trimester: Secondary | ICD-10-CM | POA: Diagnosis not present

## 2019-04-29 DIAGNOSIS — O4403 Placenta previa specified as without hemorrhage, third trimester: Secondary | ICD-10-CM

## 2019-04-29 DIAGNOSIS — O4443 Low lying placenta NOS or without hemorrhage, third trimester: Secondary | ICD-10-CM | POA: Diagnosis not present

## 2019-04-29 DIAGNOSIS — Z362 Encounter for other antenatal screening follow-up: Secondary | ICD-10-CM

## 2019-05-20 ENCOUNTER — Other Ambulatory Visit: Payer: Self-pay

## 2019-05-20 ENCOUNTER — Ambulatory Visit (HOSPITAL_COMMUNITY)
Admission: RE | Admit: 2019-05-20 | Discharge: 2019-05-20 | Disposition: A | Discharge status: Home / Self Care | Payer: Medicaid Other | Source: Ambulatory Visit | Attending: Obstetrics and Gynecology | Admitting: Obstetrics and Gynecology

## 2019-05-20 ENCOUNTER — Ambulatory Visit (HOSPITAL_COMMUNITY): Payer: Medicaid Other | Admitting: *Deleted

## 2019-05-20 ENCOUNTER — Encounter (HOSPITAL_COMMUNITY): Payer: Self-pay

## 2019-05-20 VITALS — BP 123/69 | HR 91 | Temp 97.3°F

## 2019-05-20 DIAGNOSIS — O0910 Supervision of pregnancy with history of ectopic or molar pregnancy, unspecified trimester: Secondary | ICD-10-CM

## 2019-05-20 DIAGNOSIS — Z362 Encounter for other antenatal screening follow-up: Secondary | ICD-10-CM

## 2019-05-20 DIAGNOSIS — O4403 Placenta previa specified as without hemorrhage, third trimester: Secondary | ICD-10-CM | POA: Diagnosis present

## 2019-05-20 DIAGNOSIS — O0990 Supervision of high risk pregnancy, unspecified, unspecified trimester: Secondary | ICD-10-CM | POA: Insufficient documentation

## 2019-05-20 DIAGNOSIS — O4443 Low lying placenta NOS or without hemorrhage, third trimester: Secondary | ICD-10-CM | POA: Diagnosis not present

## 2019-05-20 DIAGNOSIS — Z3A36 36 weeks gestation of pregnancy: Secondary | ICD-10-CM | POA: Diagnosis not present

## 2019-05-25 ENCOUNTER — Encounter (HOSPITAL_COMMUNITY): Payer: Self-pay

## 2019-05-25 ENCOUNTER — Telehealth: Payer: Self-pay | Admitting: General Practice

## 2019-05-25 ENCOUNTER — Other Ambulatory Visit: Payer: Self-pay

## 2019-05-25 ENCOUNTER — Ambulatory Visit (INDEPENDENT_AMBULATORY_CARE_PROVIDER_SITE_OTHER): Payer: Self-pay | Admitting: Pediatrics

## 2019-05-25 ENCOUNTER — Other Ambulatory Visit (HOSPITAL_COMMUNITY)
Admission: RE | Admit: 2019-05-25 | Discharge: 2019-05-25 | Disposition: A | Discharge status: Home / Self Care | Payer: Medicaid Other | Source: Ambulatory Visit | Attending: Obstetrics and Gynecology | Admitting: Obstetrics and Gynecology

## 2019-05-25 DIAGNOSIS — Z7681 Expectant parent(s) prebirth pediatrician visit: Secondary | ICD-10-CM

## 2019-05-25 DIAGNOSIS — Z20822 Contact with and (suspected) exposure to covid-19: Secondary | ICD-10-CM | POA: Insufficient documentation

## 2019-05-25 LAB — CBC
HCT: 32.1 % — ABNORMAL LOW (ref 36.0–46.0)
Hemoglobin: 10.8 g/dL — ABNORMAL LOW (ref 12.0–15.0)
MCH: 31.5 pg (ref 26.0–34.0)
MCHC: 33.6 g/dL (ref 30.0–36.0)
MCV: 93.6 fL (ref 80.0–100.0)
Platelets: 220 10*3/uL (ref 150–400)
RBC: 3.43 MIL/uL — ABNORMAL LOW (ref 3.87–5.11)
RDW: 12.9 % (ref 11.5–15.5)
WBC: 8.8 10*3/uL (ref 4.0–10.5)
nRBC: 0 % (ref 0.0–0.2)

## 2019-05-25 LAB — TYPE AND SCREEN
ABO/RH(D): B POS
Antibody Screen: NEGATIVE

## 2019-05-25 LAB — SARS CORONAVIRUS 2 (TAT 6-24 HRS): SARS Coronavirus 2: NEGATIVE

## 2019-05-25 NOTE — Patient Instructions (Signed)
Brittany Quinn  05/25/2019   Your procedure is scheduled on:  2.10.2021   Arrive at 1100 at Entrance C on [address] at [facility]  and [address]. You are invited to use the FREE valet parking or use the Visitor's parking deck.  Pick up the phone at the desk and dial [phone].  Call this number if you have problems the morning of surgery: [phone]  Remember:   Do not eat food:(After Midnight) Desps de medianoche.  Do not drink clear liquids: (After Midnight) Desps de medianoche.  Take these medicines the morning of surgery with A SIP OF WATER:  May take zyrtec   Do not wear jewelry, make-up or nail polish.  Do not wear lotions, powders, or perfumes. Do not wear deodorant.  Do not shave 48 hours prior to surgery.  Do not bring valuables to the hospital.  [facility] is not   responsible for any belongings or valuables brought to the hospital.  Contacts, dentures or bridgework may not be worn into surgery.  Leave suitcase in the car. After surgery it may be brought to your room.  For patients admitted to the hospital, checkout time is 11:00 AM the day of              discharge.      Please read over the following fact sheets that you were given:     Preparing for Surgery

## 2019-05-25 NOTE — Telephone Encounter (Signed)
Patient called and left message on nurse voicemail line stating she still hasn't heard anything about the date of her c-section. Patient states it is supposed to be this week but she hasn't heard anything.   Called patient stating I am returning her phone call. Patient states she has already received all the information about her c-section date. Patient has no questions.

## 2019-05-25 NOTE — Progress Notes (Signed)
Prenatal counseling for impending newborn done--Given vaccination policy and agrees to schedule.  1st child, currently 36wks, scheduled Csec for placenta previa at 37wks, followed by MFM, had early prenatal care Z76.81

## 2019-05-26 ENCOUNTER — Other Ambulatory Visit: Payer: Self-pay | Admitting: Obstetrics and Gynecology

## 2019-05-26 LAB — RPR: RPR Ser Ql: NONREACTIVE

## 2019-05-27 ENCOUNTER — Inpatient Hospital Stay (HOSPITAL_COMMUNITY): Payer: Medicaid Other | Admitting: Anesthesiology

## 2019-05-27 ENCOUNTER — Encounter (HOSPITAL_COMMUNITY)
Admission: RE | Disposition: A | Discharge status: Home / Self Care | Payer: Self-pay | Source: Home / Self Care | Attending: Obstetrics and Gynecology

## 2019-05-27 ENCOUNTER — Encounter (HOSPITAL_COMMUNITY): Payer: Self-pay | Admitting: Obstetrics and Gynecology

## 2019-05-27 ENCOUNTER — Other Ambulatory Visit: Payer: Self-pay

## 2019-05-27 ENCOUNTER — Inpatient Hospital Stay (HOSPITAL_COMMUNITY)
Admission: RE | Admit: 2019-05-27 | Discharge: 2019-05-30 | DRG: 787 | Disposition: A | Discharge status: Home / Self Care | Payer: Medicaid Other | Attending: Obstetrics and Gynecology | Admitting: Obstetrics and Gynecology

## 2019-05-27 DIAGNOSIS — O4443 Low lying placenta NOS or without hemorrhage, third trimester: Principal | ICD-10-CM | POA: Diagnosis present

## 2019-05-27 DIAGNOSIS — D649 Anemia, unspecified: Secondary | ICD-10-CM | POA: Diagnosis present

## 2019-05-27 DIAGNOSIS — F1290 Cannabis use, unspecified, uncomplicated: Secondary | ICD-10-CM | POA: Diagnosis not present

## 2019-05-27 DIAGNOSIS — O9902 Anemia complicating childbirth: Secondary | ICD-10-CM | POA: Diagnosis present

## 2019-05-27 DIAGNOSIS — O99324 Drug use complicating childbirth: Secondary | ICD-10-CM | POA: Diagnosis present

## 2019-05-27 DIAGNOSIS — O99213 Obesity complicating pregnancy, third trimester: Secondary | ICD-10-CM | POA: Insufficient documentation

## 2019-05-27 DIAGNOSIS — Z3A37 37 weeks gestation of pregnancy: Secondary | ICD-10-CM

## 2019-05-27 DIAGNOSIS — O4440 Low lying placenta NOS or without hemorrhage, unspecified trimester: Secondary | ICD-10-CM | POA: Diagnosis present

## 2019-05-27 DIAGNOSIS — O0910 Supervision of pregnancy with history of ectopic or molar pregnancy, unspecified trimester: Secondary | ICD-10-CM | POA: Diagnosis present

## 2019-05-27 DIAGNOSIS — Z87891 Personal history of nicotine dependence: Secondary | ICD-10-CM

## 2019-05-27 DIAGNOSIS — Z98891 History of uterine scar from previous surgery: Secondary | ICD-10-CM

## 2019-05-27 DIAGNOSIS — E669 Obesity, unspecified: Secondary | ICD-10-CM | POA: Diagnosis present

## 2019-05-27 DIAGNOSIS — Z20822 Contact with and (suspected) exposure to covid-19: Secondary | ICD-10-CM | POA: Diagnosis not present

## 2019-05-27 DIAGNOSIS — O99214 Obesity complicating childbirth: Secondary | ICD-10-CM | POA: Diagnosis not present

## 2019-05-27 DIAGNOSIS — Z3493 Encounter for supervision of normal pregnancy, unspecified, third trimester: Secondary | ICD-10-CM

## 2019-05-27 DIAGNOSIS — O26893 Other specified pregnancy related conditions, third trimester: Secondary | ICD-10-CM | POA: Diagnosis present

## 2019-05-27 LAB — CBC
HCT: 31.3 % — ABNORMAL LOW (ref 36.0–46.0)
Hemoglobin: 10.3 g/dL — ABNORMAL LOW (ref 12.0–15.0)
MCH: 31 pg (ref 26.0–34.0)
MCHC: 32.9 g/dL (ref 30.0–36.0)
MCV: 94.3 fL (ref 80.0–100.0)
Platelets: 213 10*3/uL (ref 150–400)
RBC: 3.32 MIL/uL — ABNORMAL LOW (ref 3.87–5.11)
RDW: 12.9 % (ref 11.5–15.5)
WBC: 10.4 10*3/uL (ref 4.0–10.5)
nRBC: 0 % (ref 0.0–0.2)

## 2019-05-27 LAB — CREATININE, SERUM
Creatinine, Ser: 0.55 mg/dL (ref 0.44–1.00)
GFR calc Af Amer: >60 mL/min (ref >60)
GFR calc non Af Amer: >60 mL/min (ref >60)

## 2019-05-27 SURGERY — Surgical Case
Anesthesia: Spinal

## 2019-05-27 MED ORDER — NALOXONE HCL 4 MG/10ML IJ SOLN
1.0000 ug/kg/h | INTRAVENOUS | Status: DC | PRN
  Filled 2019-05-27: qty 5

## 2019-05-27 MED ORDER — SIMETHICONE 80 MG PO CHEW
80.0000 mg | CHEWABLE_TABLET | ORAL | Status: DC
  Administered 2019-05-28 – 2019-05-29 (×3): 80 mg via ORAL
  Filled 2019-05-27 (×3): qty 1

## 2019-05-27 MED ORDER — HYDROMORPHONE HCL 1 MG/ML IJ SOLN
INTRAMUSCULAR | Status: AC
  Filled 2019-05-27: qty 1

## 2019-05-27 MED ORDER — NALOXONE HCL 0.4 MG/ML IJ SOLN: 0.4000 mg | INTRAMUSCULAR | Status: DC | PRN

## 2019-05-27 MED ORDER — COCONUT OIL OIL: 1.0000 "application " | TOPICAL_OIL | Status: DC | PRN

## 2019-05-27 MED ORDER — SENNOSIDES-DOCUSATE SODIUM 8.6-50 MG PO TABS
2.0000 | ORAL_TABLET | ORAL | Status: DC
  Administered 2019-05-28 – 2019-05-29 (×3): 2 via ORAL
  Filled 2019-05-27 (×3): qty 2

## 2019-05-27 MED ORDER — CEFAZOLIN SODIUM-DEXTROSE 2-4 GM/100ML-% IV SOLN
INTRAVENOUS | Status: AC
  Filled 2019-05-27: qty 100

## 2019-05-27 MED ORDER — ONDANSETRON HCL 4 MG/2ML IJ SOLN: 4.0000 mg | Freq: Three times a day (TID) | INTRAMUSCULAR | Status: DC | PRN

## 2019-05-27 MED ORDER — FENTANYL CITRATE (PF) 100 MCG/2ML IJ SOLN
INTRAMUSCULAR | Status: AC
  Filled 2019-05-27: qty 2

## 2019-05-27 MED ORDER — PHENYLEPHRINE HCL-NACL 20-0.9 MG/250ML-% IV SOLN
INTRAVENOUS | Status: DC | PRN
  Administered 2019-05-27: 60 ug/min via INTRAVENOUS

## 2019-05-27 MED ORDER — OXYTOCIN 40 UNITS IN NORMAL SALINE INFUSION - SIMPLE MED
INTRAVENOUS | Status: DC | PRN
  Administered 2019-05-27: 40 [IU] via INTRAVENOUS

## 2019-05-27 MED ORDER — LACTATED RINGERS IV SOLN: INTRAVENOUS | Status: DC | PRN

## 2019-05-27 MED ORDER — PHENYLEPHRINE HCL (PRESSORS) 10 MG/ML IV SOLN
INTRAVENOUS | Status: DC | PRN
  Administered 2019-05-27 (×2): 80 ug via INTRAVENOUS

## 2019-05-27 MED ORDER — BUPIVACAINE IN DEXTROSE 0.75-8.25 % IT SOLN
INTRATHECAL | Status: DC | PRN
  Administered 2019-05-27: 1 mL via INTRATHECAL
  Administered 2019-05-27: 1.5 mL via INTRATHECAL

## 2019-05-27 MED ORDER — PROMETHAZINE HCL 25 MG/ML IJ SOLN: 6.2500 mg | INTRAMUSCULAR | Status: DC | PRN

## 2019-05-27 MED ORDER — MEPERIDINE HCL 25 MG/ML IJ SOLN: 6.2500 mg | INTRAMUSCULAR | Status: DC | PRN

## 2019-05-27 MED ORDER — PHENYLEPHRINE HCL-NACL 20-0.9 MG/250ML-% IV SOLN
INTRAVENOUS | Status: AC
  Filled 2019-05-27: qty 250

## 2019-05-27 MED ORDER — NALBUPHINE HCL 10 MG/ML IJ SOLN: 5.0000 mg | Freq: Once | INTRAMUSCULAR | Status: DC | PRN

## 2019-05-27 MED ORDER — NALBUPHINE HCL 10 MG/ML IJ SOLN: 5.0000 mg | INTRAMUSCULAR | Status: DC | PRN

## 2019-05-27 MED ORDER — SODIUM CHLORIDE 0.9% FLUSH: 3.0000 mL | INTRAVENOUS | Status: DC | PRN

## 2019-05-27 MED ORDER — ONDANSETRON HCL 4 MG/2ML IJ SOLN
INTRAMUSCULAR | Status: AC
  Filled 2019-05-27: qty 2

## 2019-05-27 MED ORDER — FENTANYL CITRATE (PF) 100 MCG/2ML IJ SOLN
INTRAMUSCULAR | Status: DC | PRN
  Administered 2019-05-27: 15 ug via INTRAVENOUS

## 2019-05-27 MED ORDER — SODIUM CHLORIDE 0.9 % IV SOLN: INTRAVENOUS | Status: DC | PRN

## 2019-05-27 MED ORDER — OXYTOCIN 40 UNITS IN NORMAL SALINE INFUSION - SIMPLE MED: 2.5000 [IU]/h | INTRAVENOUS | Status: AC

## 2019-05-27 MED ORDER — TETANUS-DIPHTH-ACELL PERTUSSIS 5-2.5-18.5 LF-MCG/0.5 IM SUSP: 0.5000 mL | Freq: Once | INTRAMUSCULAR | Status: DC

## 2019-05-27 MED ORDER — CEFAZOLIN SODIUM-DEXTROSE 2-4 GM/100ML-% IV SOLN
2.0000 g | INTRAVENOUS | Status: AC
  Administered 2019-05-27: 2 g via INTRAVENOUS

## 2019-05-27 MED ORDER — SCOPOLAMINE 1 MG/3DAYS TD PT72
1.0000 | MEDICATED_PATCH | Freq: Once | TRANSDERMAL | Status: AC
  Administered 2019-05-27: 1.5 mg via TRANSDERMAL

## 2019-05-27 MED ORDER — DIPHENHYDRAMINE HCL 25 MG PO CAPS: 25.0000 mg | ORAL_CAPSULE | Freq: Four times a day (QID) | ORAL | Status: DC | PRN

## 2019-05-27 MED ORDER — SCOPOLAMINE 1 MG/3DAYS TD PT72
MEDICATED_PATCH | TRANSDERMAL | Status: AC
  Filled 2019-05-27: qty 1

## 2019-05-27 MED ORDER — SIMETHICONE 80 MG PO CHEW: 80.0000 mg | CHEWABLE_TABLET | ORAL | Status: DC | PRN

## 2019-05-27 MED ORDER — SIMETHICONE 80 MG PO CHEW
80.0000 mg | CHEWABLE_TABLET | Freq: Three times a day (TID) | ORAL | Status: DC
  Administered 2019-05-27 – 2019-05-29 (×5): 80 mg via ORAL
  Filled 2019-05-27 (×6): qty 1

## 2019-05-27 MED ORDER — ONDANSETRON HCL 4 MG/2ML IJ SOLN
INTRAMUSCULAR | Status: DC | PRN
  Administered 2019-05-27: 4 mg via INTRAVENOUS

## 2019-05-27 MED ORDER — MORPHINE SULFATE (PF) 0.5 MG/ML IJ SOLN
INTRAMUSCULAR | Status: AC
  Filled 2019-05-27: qty 10

## 2019-05-27 MED ORDER — ENOXAPARIN SODIUM 60 MG/0.6ML ~~LOC~~ SOLN
50.0000 mg | SUBCUTANEOUS | Status: DC
  Administered 2019-05-28 – 2019-05-29 (×2): 50 mg via SUBCUTANEOUS
  Filled 2019-05-27 (×3): qty 0.6

## 2019-05-27 MED ORDER — DIBUCAINE (PERIANAL) 1 % EX OINT: 1.0000 "application " | TOPICAL_OINTMENT | CUTANEOUS | Status: DC | PRN

## 2019-05-27 MED ORDER — KETOROLAC TROMETHAMINE 30 MG/ML IJ SOLN
INTRAMUSCULAR | Status: AC
  Filled 2019-05-27: qty 1

## 2019-05-27 MED ORDER — DIPHENHYDRAMINE HCL 50 MG/ML IJ SOLN: 12.5000 mg | INTRAMUSCULAR | Status: DC | PRN

## 2019-05-27 MED ORDER — PRENATAL MULTIVITAMIN CH
1.0000 | ORAL_TABLET | Freq: Every day | ORAL | Status: DC
  Administered 2019-05-28 – 2019-05-29 (×2): 1 via ORAL
  Filled 2019-05-27 (×3): qty 1

## 2019-05-27 MED ORDER — MENTHOL 3 MG MT LOZG: 1.0000 | LOZENGE | OROMUCOSAL | Status: DC | PRN

## 2019-05-27 MED ORDER — MORPHINE SULFATE (PF) 0.5 MG/ML IJ SOLN
INTRAMUSCULAR | Status: DC | PRN
  Administered 2019-05-27: .15 mg via EPIDURAL

## 2019-05-27 MED ORDER — LACTATED RINGERS IV SOLN: INTRAVENOUS | Status: DC

## 2019-05-27 MED ORDER — ZOLPIDEM TARTRATE 5 MG PO TABS: 5.0000 mg | ORAL_TABLET | Freq: Every evening | ORAL | Status: DC | PRN

## 2019-05-27 MED ORDER — HYDROMORPHONE HCL 1 MG/ML IJ SOLN
0.2500 mg | INTRAMUSCULAR | Status: DC | PRN
  Administered 2019-05-27 (×2): 0.25 mg via INTRAVENOUS
  Administered 2019-05-27: 0.5 mg via INTRAVENOUS

## 2019-05-27 MED ORDER — DIPHENHYDRAMINE HCL 25 MG PO CAPS
25.0000 mg | ORAL_CAPSULE | ORAL | Status: DC | PRN
  Administered 2019-05-27 – 2019-05-28 (×2): 25 mg via ORAL
  Filled 2019-05-27 (×2): qty 1

## 2019-05-27 MED ORDER — KETOROLAC TROMETHAMINE 30 MG/ML IJ SOLN
30.0000 mg | Freq: Once | INTRAMUSCULAR | Status: AC | PRN
  Administered 2019-05-27: 30 mg via INTRAVENOUS

## 2019-05-27 MED ORDER — OXYTOCIN 40 UNITS IN NORMAL SALINE INFUSION - SIMPLE MED
INTRAVENOUS | Status: AC
  Filled 2019-05-27: qty 1000

## 2019-05-27 MED ORDER — OXYCODONE HCL 5 MG PO TABS
5.0000 mg | ORAL_TABLET | ORAL | Status: DC | PRN
  Administered 2019-05-28: 5 mg via ORAL
  Administered 2019-05-28: 10 mg via ORAL
  Administered 2019-05-28: 5 mg via ORAL
  Administered 2019-05-28 – 2019-05-29 (×2): 10 mg via ORAL
  Administered 2019-05-29: 5 mg via ORAL
  Filled 2019-05-27 (×2): qty 1
  Filled 2019-05-27: qty 2
  Filled 2019-05-27: qty 1
  Filled 2019-05-27 (×2): qty 2

## 2019-05-27 MED ORDER — WITCH HAZEL-GLYCERIN EX PADS: 1.0000 "application " | MEDICATED_PAD | CUTANEOUS | Status: DC | PRN

## 2019-05-27 SURGICAL SUPPLY — 38 items
BENZOIN TINCTURE PRP APPL 2/3 (GAUZE/BANDAGES/DRESSINGS) ×3 IMPLANT
CHLORAPREP W/TINT 26ML (MISCELLANEOUS) ×3 IMPLANT
CLAMP CORD UMBIL (MISCELLANEOUS) IMPLANT
CLOSURE STERI STRIP 1/2 X4 (GAUZE/BANDAGES/DRESSINGS) ×2 IMPLANT
CLOSURE WOUND 1/2 X4 (GAUZE/BANDAGES/DRESSINGS) ×1
CLOTH BEACON ORANGE TIMEOUT ST (SAFETY) ×3 IMPLANT
DRSG OPSITE POSTOP 4X10 (GAUZE/BANDAGES/DRESSINGS) ×3 IMPLANT
ELECT REM PT RETURN 9FT ADLT (ELECTROSURGICAL) ×3
ELECTRODE REM PT RTRN 9FT ADLT (ELECTROSURGICAL) ×1 IMPLANT
EXTRACTOR VACUUM M CUP 4 TUBE (SUCTIONS) IMPLANT
EXTRACTOR VACUUM M CUP 4' TUBE (SUCTIONS)
GAUZE SPONGE 4X4 12PLY STRL LF (GAUZE/BANDAGES/DRESSINGS) ×6 IMPLANT
GLOVE BIOGEL PI IND STRL 7.0 (GLOVE) ×2 IMPLANT
GLOVE BIOGEL PI IND STRL 7.5 (GLOVE) ×2 IMPLANT
GLOVE BIOGEL PI INDICATOR 7.0 (GLOVE) ×4
GLOVE BIOGEL PI INDICATOR 7.5 (GLOVE) ×4
GLOVE ECLIPSE 7.5 STRL STRAW (GLOVE) ×3 IMPLANT
GOWN STRL REUS W/TWL LRG LVL3 (GOWN DISPOSABLE) ×9 IMPLANT
KIT ABG SYR 3ML LUER SLIP (SYRINGE) IMPLANT
NEEDLE HYPO 25X5/8 SAFETYGLIDE (NEEDLE) IMPLANT
NS IRRIG 1000ML POUR BTL (IV SOLUTION) ×3 IMPLANT
PACK C SECTION WH (CUSTOM PROCEDURE TRAY) ×3 IMPLANT
PAD ABD 7.5X8 STRL (GAUZE/BANDAGES/DRESSINGS) ×3 IMPLANT
PAD OB MATERNITY 4.3X12.25 (PERSONAL CARE ITEMS) ×3 IMPLANT
PENCIL SMOKE EVAC W/HOLSTER (ELECTROSURGICAL) ×3 IMPLANT
RETAINER VISCERAL (MISCELLANEOUS) ×3 IMPLANT
RTRCTR C-SECT PINK 25CM LRG (MISCELLANEOUS) ×3 IMPLANT
STRIP CLOSURE SKIN 1/2X4 (GAUZE/BANDAGES/DRESSINGS) ×2 IMPLANT
SUT PLAIN 2 0 XLH (SUTURE) ×3 IMPLANT
SUT VIC AB 0 CT1 36 (SUTURE) ×3 IMPLANT
SUT VIC AB 0 CTX 36 (SUTURE) ×4
SUT VIC AB 0 CTX36XBRD ANBCTRL (SUTURE) ×2 IMPLANT
SUT VIC AB 2-0 CT1 27 (SUTURE) ×2
SUT VIC AB 2-0 CT1 TAPERPNT 27 (SUTURE) ×1 IMPLANT
SUT VIC AB 4-0 KS 27 (SUTURE) ×3 IMPLANT
TOWEL OR 17X24 6PK STRL BLUE (TOWEL DISPOSABLE) ×3 IMPLANT
TRAY FOLEY W/BAG SLVR 14FR LF (SET/KITS/TRAYS/PACK) ×3 IMPLANT
WATER STERILE IRR 1000ML POUR (IV SOLUTION) ×3 IMPLANT

## 2019-05-27 NOTE — Anesthesia Procedure Notes (Signed)
Spinal  Patient location during procedure: OR Staffing Anesthesiologist: Adeena Bernabe, MD Preanesthetic Checklist Completed: patient identified, IV checked, site marked, risks and benefits discussed, surgical consent, monitors and equipment checked, pre-op evaluation and timeout performed Spinal Block Patient position: sitting Prep: DuraPrep and site prepped and draped Patient monitoring: heart rate, cardiac monitor, continuous pulse ox and blood pressure Approach: midline Location: L3-4 Injection technique: single-shot Needle Needle type: Sprotte  Needle gauge: 24 G Needle length: 9 cm Assessment Sensory level: T4     

## 2019-05-27 NOTE — Anesthesia Preprocedure Evaluation (Addendum)
Anesthesia Evaluation  Patient identified by MRN, date of birth, ID band Patient awake    Reviewed: Allergy & Precautions, H&P , NPO status , Patient's Chart, lab work & pertinent test results  Airway Mallampati: II  TM Distance: >3 FB Neck ROM: full    Dental no notable dental hx. (+) Dental Advisory Given   Pulmonary former smoker,    Pulmonary exam normal breath sounds clear to auscultation       Cardiovascular negative cardio ROS   Rhythm:regular Rate:Normal     Neuro/Psych negative neurological ROS  negative psych ROS   GI/Hepatic negative GI ROS, Neg liver ROS,   Endo/Other  negative endocrine ROS  Renal/GU negative Renal ROS     Musculoskeletal negative musculoskeletal ROS (+)   Abdominal (+) + obese,   Peds  Hematology negative hematology ROS (+)   Anesthesia Other Findings   Reproductive/Obstetrics (+) Pregnancy                            Anesthesia Physical  Anesthesia Plan  ASA: II  Anesthesia Plan: Spinal   Post-op Pain Management:    Induction: Intravenous  PONV Risk Score and Plan: 4 or greater and Ondansetron, Dexamethasone, Scopolamine patch - Pre-op and Treatment may vary due to age or medical condition  Airway Management Planned: Natural Airway  Additional Equipment:   Intra-op Plan:   Post-operative Plan:   Informed Consent: I have reviewed the patients History and Physical, chart, labs and discussed the procedure including the risks, benefits and alternatives for the proposed anesthesia with the patient or authorized representative who has indicated his/her understanding and acceptance.     Dental advisory given  Plan Discussed with: CRNA  Anesthesia Plan Comments:        Anesthesia Quick Evaluation

## 2019-05-27 NOTE — Op Note (Signed)
Brittany Quinn PROCEDURE DATE: 05/27/2019  PREOPERATIVE DIAGNOSES: Intrauterine pregnancy at [redacted]w[redacted]d weeks gestation; low-lying placenta  POSTOPERATIVE DIAGNOSES: The same  PROCEDURE: Primary Low Transverse Cesarean Section  SURGEON:  Dr. Misty Stanley - Primary Jerilynn Birkenhead, MD - Fellow  ANESTHESIOLOGY TEAM: Anesthesiologist: Lewie Loron, MD CRNA: Armanda Heritage, CRNA  INDICATIONS: Brittany Quinn is a 29 y.o. G3P0020 at [redacted]w[redacted]d here for cesarean section secondary to the indications listed under preoperative diagnoses; please see preoperative note for further details.  The risks of cesarean section were discussed with the patient including but were not limited to: bleeding which may require transfusion or reoperation; infection which may require antibiotics; injury to bowel, bladder, ureters or other surrounding organs; injury to the fetus; need for additional procedures including hysterectomy in the event of a life-threatening hemorrhage; placental abnormalities wth subsequent pregnancies, incisional problems, thromboembolic phenomenon and other postoperative/anesthesia complications.   The patient concurred with the proposed plan, giving informed written consent for the procedure.    FINDINGS:  Viable female infant in cephalic presentation. Clear amniotic fluid.  Intact placenta, three vessel cord.  Normal uterus. Normal fallopian tube and ovary on right side; left side absent. APGAR (1 MIN): 7  APGAR (5 MINS): 8  APGAR (10 MINS):    ANESTHESIA: Spinal INTRAVENOUS FLUIDS: 1100 ml   ESTIMATED BLOOD LOSS: 633 ml URINE OUTPUT:  300 ml SPECIMENS: Placenta sent to L&D COMPLICATIONS: None immediate  PROCEDURE IN DETAIL:  The patient preoperatively received intravenous antibiotics and had sequential compression devices applied to her lower extremities.  She was then taken to the operating room where spinal anesthesia was administered and was found to be adequate. She was then placed in a dorsal  supine position with a leftward tilt, and prepped and draped in a sterile manner.  A foley catheter was placed into her bladder and attached to constant gravity.  After an adequate timeout was performed, a Pfannenstiel skin incision was made with scalpel and carried through to the underlying layer of fascia. The fascia was incised in the midline, and this incision was extended bilaterally using the Mayo scissors.  Kocher clamps were applied to the superior aspect of the fascial incision and the underlying rectus muscles were dissected off bluntly and sharply. The rectus muscles were separated in the midline and the peritoneum was entered bluntly. The Alexis self-retaining retractor was introduced into the abdominal cavity.  Attention was turned to the lower uterine segment where a low transverse hysterotomy was made with a scalpel and extended bilaterally bluntly.  The infant was successfully delivered, the cord was clamped and cut after one minute, and the infant was handed over to the awaiting neonatology team. Uterine massage was then administered, and the placenta delivered intact with a three-vessel cord. The uterus was then cleared of clots and debris.  The hysterotomy was closed with 0 Vicryl in a running locked fashion, and an imbricating layer was also placed with 0 Vicryl. The pelvis was cleared of all clot and debris. Hemostasis was confirmed on all surfaces.  The retractor was removed.  The peritoneum was closed with a 2-0 Vicryl running stitch. The fascia was then closed using 0 Vicryl in a running fashion.  The subcutaneous layer was irrigated, reapproximated with 2-0 plain gut interrupted stitches, and the skin was closed with a 4-0 Vicryl subcuticular stitch. The patient tolerated the procedure well. Sponge, instrument and needle counts were correct x 3.  She was taken to the recovery room in stable condition.   Brittany Quinn  Paraskevi Funez, MD OB Family Medicine Fellow, [facility] for [employer], [place]

## 2019-05-27 NOTE — Lactation Note (Signed)
This note was copied from a baby's chart. Lactation Consultation Note  Patient Name: Brittany Quinn YYQMG'N Date: 05/27/2019 Reason for consult: Initial assessment;Difficult latch;Primapara;1st time breastfeeding;Early term 37-38.6wks;Mother's request  I conducted an initial consult with Ms. Wernette upon RN request. Baby "Donalynn Furlong" is 5 hours old. Ms. Abbruzzese is a P1 who took the breast feeding workshop online with [facility]. She has an Mining engineer personal pump at home.  RN mentioned that baby is having difficulty maintaining latch. Upon entry, the support person approached [place] and asked if baby had to have breast milk. He expressed concern about baby getting something to eat. We discussed the benefits of breast milk and the options. He then left to get some dinner and stated that he would allow mom to make decisions about how to feed.  We tried to latch [place] first in football hold on the right breast and then in cradle hold on the left breast. We hand expressed colostrum and allowed him to lick and learn. I also did a little suck practice. Ayden appeared to root and show interest in the breast, but ultimately would fall asleep and not latch.  We reviewed day 1 infant feeding patterns and I educated that it's normal for baby to be sleepy at this time. I set up a manual pump and reviewed how to clean and use it.  I offered to come back for follow up tonight and to speak further with her support person.  All questions answered at this time.   Maternal Data Formula Feeding for Exclusion: No Has patient been taught Hand Expression?: Yes Does the patient have breastfeeding experience prior to this delivery?: No  Feeding Feeding Type: Breast Fed  LATCH Score Latch: Too sleepy or reluctant, no latch achieved, no sucking elicited.  Audible Swallowing: None  Type of Nipple: Everted at rest and after stimulation  Comfort (Breast/Nipple): Soft / non-tender  Hold (Positioning): Assistance needed to correctly  position infant at breast and maintain latch.  LATCH Score: 5  Interventions Interventions: Breast feeding basics reviewed;Assisted with latch;Skin to skin;Hand express;Adjust position;Support pillows;Position options;Hand pump  Lactation Tools Discussed/Used Pump Review: Setup, frequency, and cleaning Initiated by:: hl Date initiated:: 05/27/19   Consult Status Consult Status: Follow-up Date: 05/27/19 Follow-up type: In-patient    Walker Shadow 05/27/2019, 7:09 PM

## 2019-05-27 NOTE — H&P (Signed)
Obstetric Preoperative History and Physical  Brittany Quinn is a 29 y.o. G3P0020 with IUP at 53w0dpresenting for scheduled cesarean section.  Reports good fetal movement, no bleeding, no contractions, no leaking of fluid.  No acute preoperative concerns.    Cesarean Section Indication: low-lying placenta  Prenatal Course Source of Care: [facility]  Pregnancy complications or risks: Patient Active Problem List   Diagnosis Date Noted  . Low-lying placenta 04/27/2019  . BMI 34.0-34.9,adult 12/17/2018  . Former smoker 12/17/2018  . Supervision of low-risk pregnancy 12/02/2018  . Pregnancy with history of ectopic pregnancy, antepartum 12/02/2018   She plans to breastfeed She desires no method for postpartum contraception.   Prenatal labs and studies: ABO, Rh: --/--/B POS (02/08 1321) Antibody: NEG (02/08 1321) Rubella: 2.96 (09/02 1116) RPR: NON REACTIVE (02/08 1321)  HBsAg: Negative (09/02 1116)  HIV: Non Reactive (12/15 1008)  GBS:  2 hr Glucola  WNL Genetic screening normal NIPS Anatomy [place]/23 Impression  Normal interval growth.  No ultrasonic evidence of structural  fetal anomalies.  Low lying placenta  NIPS- pending  Suboptimal views of the fetal anatomy was obtained  secondary to fetal position.  Pregnancy dated by today's examination.  Prenatal Transfer Tool  Maternal Diabetes: No Genetic Screening: Normal Maternal Ultrasounds/Referrals: Normal Fetal Ultrasounds or other Referrals:  None Maternal Substance Abuse:  No Significant Maternal Medications:  None Significant Maternal Lab Results: Other: GBS unknown   Past Medical History:  Diagnosis Date  . Medical history non-contributory   . Trichomonas     Past Surgical History:  Procedure Laterality Date  . LAPAROSCOPY  06/23/2011   Procedure: LAPAROSCOPY OPERATIVE;  Surgeon: UOsborne Oman MD;  [facility];  Service: Gynecology;  Laterality: Left;  Diagnostic laparoscopy  . LAPAROTOMY  06/23/2011   Procedure:  LAPAROTOMY;  Surgeon: UOsborne Oman MD;  [facility];  Service: Gynecology;  Laterality: Left;  Wedge resection of Ruptured left cornual ectopic pregnancy with left salpingectomy    OB History  Gravida Para Term Preterm AB Living  3       2    SAB TAB Ectopic Multiple Live Births  1   1        # Outcome Date GA Lbr Len/2nd Weight Sex Delivery Anes PTL Lv  3 Current           2 Ectopic              Birth Comments: System Generated. Please review and update pregnancy details.  1 SAB             Social History   Socioeconomic History  . Marital status: Single    Spouse name: Not on file  . Number of children: Not on file  . Years of education: Not on file  . Highest education level: Not on file  Occupational History  . Not on file  Tobacco Use  . Smoking status: Former Smoker    Types: Cigars    Quit date: 09/01/2018    Years since quitting: 0.7  . Smokeless tobacco: Never Used  . Tobacco comment: Blacks  Substance and Sexual Activity  . Alcohol use: Not Currently    Comment: occ  . Drug use: Yes    Types: Marijuana    Comment: last use May 2020  . Sexual activity: Yes  Other Topics Concern  . Not on file  Social History Narrative  . Not on file   Social Determinants of Health  Financial Resource Strain:   . Difficulty of Paying Living Expenses: Not on file  Food Insecurity: Food Insecurity Present  . Worried About Charity fundraiser in the Last Year: Sometimes true  . Ran Out of Food in the Last Year: Sometimes true  Transportation Needs: Unmet Transportation Needs  . Lack of Transportation (Medical): Yes  . Lack of Transportation (Non-Medical): Yes  Physical Activity:   . Days of Exercise per Week: Not on file  . Minutes of Exercise per Session: Not on file  Stress:   . Feeling of Stress : Not on file  Social Connections:   . Frequency of Communication with Friends and Family: Not on file  . Frequency of Social Gatherings with Friends and Family:  Not on file  . Attends Religious Services: Not on file  . Active Member of Clubs or Organizations: Not on file  . Attends Archivist Meetings: Not on file  . Marital Status: Not on file    Family History  Problem Relation Age of Onset  . Breast cancer Mother   . Cancer Father        prostate  . Diabetes Father     Medications Prior to Admission  Medication Sig Dispense Refill Last Dose  . cetirizine (ZYRTEC) 10 MG tablet Take 10 mg by mouth daily.   05/26/2019 at Unknown time  . Prenatal Vit-Fe Phos-FA-Omega (VITAFOL GUMMIES) 3.33-0.333-34.8 MG CHEW Chew 3 each by mouth daily. 90 tablet 11 05/26/2019 at Unknown time  . Blood Pressure Monitoring (BLOOD PRESSURE KIT) DEVI 1 Device by Does not apply route as needed. ICD 10 Z34.90 1 Device 0   . docusate calcium (SURFAK) 240 MG capsule Take 1 capsule (240 mg total) by mouth daily. (Patient not taking: Reported on 03/03/2019) 30 capsule 2 Not Taking at Unknown time  . Doxylamine-Pyridoxine (DICLEGIS) 10-10 MG TBEC Take 2 by mouth at night, one in the morning and one in the afternoon as needed. (Patient not taking: Reported on 03/31/2019) 60 tablet 2 Not Taking at Unknown time    No Known Allergies  Review of Systems: Pertinent items noted in HPI and remainder of comprehensive ROS otherwise negative.  Physical Exam: BP 118/69   Temp 98.4 F (36.9 C) (Oral)   Resp 18   Ht _0  (1.6 m)   Wt 97.1 kg   LMP 08/27/2018 (Exact Date)   BMI 37.91 kg/m  FHR by Doppler: 154 bpm CONSTITUTIONAL: Well-developed, well-nourished female in no acute distress.  HENT:  Normocephalic, atraumatic, External right and left ear normal.  EYES: Conjunctivae and EOM are normal. No scleral icterus.  NECK: Normal range of motion, supple, no masses SKIN: Skin is warm and dry. No rash noted. Not diaphoretic. No erythema. No pallor. [place]: Alert and oriented to person, place, and time. Normal reflexes, muscle tone coordination. No cranial nerve  deficit noted. PSYCHIATRIC: Normal mood and affect. Normal behavior. Normal judgment and thought content. CARDIOVASCULAR: Normal heart rate noted RESPIRATORY: Effort and breath sounds normal, no problems with respiration noted ABDOMEN: Soft, nontender, nondistended, gravid. Well-healed Pfannenstiel incision. PELVIC: Deferred MUSCULOSKELETAL: Normal range of motion. No edema and no tenderness. 2+ distal pulses.   Pertinent Labs/Studies:   Results for orders placed or performed during the hospital encounter of 05/25/19 (from the past 72 hour(s))  CBC     Status: Abnormal   Collection Time: 05/25/19  1:21 PM  Result Value Ref Range   WBC 8.8 4.0 - 10.5 K/uL   RBC 3.43 (  L) 3.87 - 5.11 MIL/uL   Hemoglobin 10.8 (L) 12.0 - 15.0 g/dL   HCT 32.1 (L) 36.0 - 46.0 %   MCV 93.6 80.0 - 100.0 fL   MCH 31.5 26.0 - 34.0 pg   MCHC 33.6 30.0 - 36.0 g/dL   RDW 12.9 11.5 - 15.5 %   Platelets 220 150 - 400 K/uL   nRBC 0.0 0.0 - 0.2 %    Comment: Performed at [facility] [address] [place], [place] [address]  RPR     Status: None   Collection Time: 05/25/19  1:21 PM  Result Value Ref Range   RPR Ser Ql NON REACTIVE NON REACTIVE    Comment: Performed at [facility] [address] [place], [place] [address]  Type and screen [place]     Status: None   Collection Time: 05/25/19  1:21 PM  Result Value Ref Range   ABO/RH(D) B POS    Antibody Screen NEG    Sample Expiration      05/28/2019,2359 Performed at [facility], [place] [address], [place], [place] [address]   SARS CORONAVIRUS 2 (TAT 6-24 HRS) Nasopharyngeal Nasopharyngeal Swab     Status: None   Collection Time: 05/25/19  1:21 PM   Specimen: Nasopharyngeal Swab  Result Value Ref Range   SARS Coronavirus 2 NEGATIVE NEGATIVE    Comment: (NOTE) SARS-CoV-2 target nucleic acids are NOT DETECTED. The SARS-CoV-2 RNA is generally detectable in upper and lower respiratory specimens during the acute phase  of infection. Negative results do not preclude SARS-CoV-2 infection, do not rule out co-infections with other pathogens, and should not be used as the sole basis for treatment or other patient management decisions. Negative results must be combined with clinical observations, patient history, and epidemiological information. The expected result is Negative. Fact Sheet for Patients: SugarRoll.be Fact Sheet for Healthcare Providers: https://www.woods-mathews.com/ This test is not yet approved or cleared by the [place] FDA and  has been authorized for detection and/or diagnosis of SARS-CoV-2 by FDA under an Emergency Use Authorization (EUA). This EUA will remain  in effect (meaning this test can be used) for the duration of the COVID-19 declaration under Section 56 4(b)(1) of the Act, 21 U.S.C. section 360bbb-3(b)(1), unless the authorization is terminated or revoked sooner. Performed at [facility], [employer] [address] [place], [place] [address]     Assessment and Plan: Brittany Quinn is a 29 y.o. G3P0020 at 12w0dbeing admitted for scheduled cesarean section. The risks of cesarean section discussed with the patient included but were not limited to: bleeding which may require transfusion or reoperation; infection which may require antibiotics; injury to bowel, bladder, ureters or other surrounding organs; injury to the fetus; need for additional procedures including hysterectomy in the event of a life-threatening hemorrhage; placental abnormalities with subsequent pregnancies, incisional problems, thromboembolic phenomenon and other postoperative/anesthesia complications. The patient concurred with the proposed plan, giving informed written consent for the procedure. Patient has been NPO since last night she will remain NPO for procedure. Anesthesia and OR aware. Preoperative prophylactic antibiotics and SCDs ordered on call to the OR. To OR when  ready.   Pregnancy Complications: Previous ectopic surgery with Pfannenstiel incision. Low lying posterior placenta measures about 1 cm away from the internal os on 05/20/2019. Former smoker. BMI 37. Contraception: Declines Circumcision: Outpatient MOF: Breast  CBarrington Ellison MD OBon [facility] Medical CenterFamily Medicine Fellow, FMethod[facility]dsonfor WDe[facility]

## 2019-05-27 NOTE — Anesthesia Procedure Notes (Signed)
Spinal  Patient location during procedure: OR Staffing Anesthesiologist: Lewie Loron, MD Preanesthetic Checklist Completed: patient identified, IV checked, site marked, risks and benefits discussed, surgical consent, monitors and equipment checked, pre-op evaluation and timeout performed Spinal Block Patient position: sitting Prep: DuraPrep and site prepped and draped Patient monitoring: heart rate, cardiac monitor, continuous pulse ox and blood pressure Approach: midline Location: L3-4 Injection technique: single-shot Needle Needle type: Sprotte  Needle gauge: 24 G Needle length: 9 cm Assessment Sensory level: T4 Additional Notes First spinal failed to provide surgical anesthesia.

## 2019-05-27 NOTE — Transfer of Care (Signed)
Immediate Anesthesia Transfer of Care Note  Patient: Brittany Quinn  Procedure(s) Performed: CESAREAN SECTION (N/A )  Patient Location: PACU  Anesthesia Type:Spinal  Level of Consciousness: awake, alert  and oriented  Airway & Oxygen Therapy: Patient Spontanous Breathing  Post-op Assessment: Report given to RN and Post -op Vital signs reviewed and stable  Post vital signs: Reviewed and stable  Last Vitals:  Vitals Value Taken Time  BP 110/53 05/27/19 1404  Temp    Pulse 85 05/27/19 1406  Resp 16 05/27/19 1406  SpO2 98 % 05/27/19 1406  Vitals shown include unvalidated device data.  Last Pain:  Vitals:   05/27/19 1121  TempSrc: Oral  PainSc: 0-No pain         Complications: No apparent anesthesia complications

## 2019-05-27 NOTE — Discharge Summary (Addendum)
Postpartum Discharge Summary      Patient Name: Brittany Quinn DOB: 03/05/1991 MRN: [id number]  Date of admission: 05/27/2019 Delivering Provider: Laurey Arrow [place]   Date of discharge: 05/30/2019  Admitting diagnosis: Status post primary low transverse cesarean section [Z98.891] Intrauterine pregnancy: [redacted]w[redacted]d    Secondary diagnosis:  Active Problems:   Pregnancy with history of ectopic pregnancy, antepartum   Maternal obesity syndrome in third trimester   Former smoker   Low-lying placenta   Status post primary low transverse cesarean section  Additional problems: None     Discharge diagnosis: Term Pregnancy Delivered                                                                                                Post partum procedures:rhogam  Augmentation: NA  Complications: None  Hospital course:  Sceduled C/S   29y.o. yo G3P0020 at 339w0das admitted to the hospital 05/27/2019 for scheduled cesarean section with the following indication:low-lying placenta.  Membrane Rupture Time/Date: 1:24 PM ,05/27/2019   Patient delivered a Viable infant.05/27/2019  Details of operation can be found in separate operative note.  Pateint had an uncomplicated postpartum course.  She is ambulating, tolerating a regular diet, passing flatus, and urinating well. Patient is discharged home in stable condition on  05/30/19        Delivery time: 1:24 PM    Magnesium Sulfate received: No BMZ received: No Rhophylac:N/A MMR:No Transfusion:No  Physical exam  Vitals:   05/28/19 1422 05/29/19 0530 05/29/19 1545 05/30/19 0530  BP: 107/62 (!) 111/57 120/60 115/66  Pulse: 76 86 83 87  Resp: '18 16 18 16  ' Temp: 98.5 F (36.9 C) 99.1 F (37.3 C) 98.6 F (37 C) 98.8 F (37.1 C)  TempSrc: Oral Oral Oral Oral  SpO2: 98% 98%  95%  Weight:      Height:       General: alert, cooperative and no distress Lochia: appropriate Uterine Fundus: firm Incision: Healing well with no significant  drainage DVT Evaluation: No evidence of DVT seen on physical exam. Labs: Lab Results  Component Value Date   WBC 12.3 (H) 05/28/2019   HGB 9.1 (L) 05/28/2019   HCT 27.0 (L) 05/28/2019   MCV 93.1 05/28/2019   PLT 203 05/28/2019   CMP Latest Ref Rng & Units 05/27/2019  Glucose 65 - 99 mg/dL -  BUN 6 - 20 mg/dL -  Creatinine 0.44 - 1.00 mg/dL 0.55  Sodium 135 - 145 mmol/L -  Potassium 3.5 - 5.1 mmol/L -  Chloride 101 - 111 mmol/L -  CO2 22 - 32 mmol/L -  Calcium 8.9 - 10.3 mg/dL -  Total Protein 6.5 - 8.1 g/dL -  Total Bilirubin 0.3 - 1.2 mg/dL -  Alkaline Phos 38 - 126 U/L -  AST 15 - 41 U/L -  ALT 14 - 54 U/L -   Edinburgh Score: Edinburgh Postnatal Depression Scale Screening Tool 05/27/2019  I have been able to laugh and see the funny side of things. 0  I have looked forward with enjoyment to things. 1  I have  blamed myself unnecessarily when things went wrong. 2  I have been anxious or worried for no good reason. 0  I have felt scared or panicky for no good reason. 0  Things have been getting on top of me. 0  I have been so unhappy that I have had difficulty sleeping. 0  I have felt sad or miserable. 0  I have been so unhappy that I have been crying. 1  The thought of harming myself has occurred to me. 0  Edinburgh Postnatal Depression Scale Total 4    Discharge instruction: per After Visit Summary and "Baby and Me Booklet".  After visit meds:  Allergies as of 05/30/2019   No Known Allergies     Medication List    STOP taking these medications   docusate calcium 240 MG capsule Commonly known as: SURFAK   Doxylamine-Pyridoxine 10-10 MG Tbec Commonly known as: Diclegis     TAKE these medications   Blood Pressure Kit Devi 1 Device by Does not apply route as needed. ICD 10 Z34.90   cetirizine 10 MG tablet Commonly known as: ZYRTEC Take 10 mg by mouth daily.   ibuprofen 800 MG tablet Commonly known as: ADVIL Take 1 tablet (800 mg total) by mouth every 8  (eight) hours as needed.   oxyCODONE 5 MG immediate release tablet Commonly known as: Oxy IR/ROXICODONE Take 1-2 tablets (5-10 mg total) by mouth every 6 (six) hours as needed for moderate pain.   Vitafol Gummies 3.33-0.333-34.8 MG Chew Chew 3 each by mouth daily.       Diet: routine diet  Activity: Advance as tolerated. Pelvic rest for 6 weeks.   Outpatient follow up:4 weeks Follow up Appt: Future Appointments  Date Time Provider [place]  06/10/2019  1:30 PM [place]  06/25/2019  1:55 PM Aletha Halim, MD [facility]   Follow up Visit:    Please schedule this patient for Postpartum visit in: 4 weeks with the following provider: Any provider Virtual For C/S patients schedule nurse incision check in weeks 2 weeks: yes Low risk pregnancy complicated by: previa Delivery mode:  CS Anticipated Birth Control:  other/unsure PP Procedures needed: Incision check  Schedule Integrated BH visit: no    Newborn Data: Live born female  Birth Weight: 6lbs 7.6 oz APGAR (1 MIN): 7  APGAR (5 MINS): 8  APGAR (10 MINS):     Newborn Delivery   Birth date/time: 05/27/2019 13:24:00 Delivery type: C-Section, Low Transverse C-section categorization: Primary      Baby Feeding: Breast Disposition:home with mother  Marcille Buffy DNP, CNM  05/30/19  11:11 AM

## 2019-05-28 ENCOUNTER — Encounter (HOSPITAL_COMMUNITY): Payer: Self-pay | Admitting: Obstetrics and Gynecology

## 2019-05-28 LAB — CBC
HCT: 27 % — ABNORMAL LOW (ref 36.0–46.0)
Hemoglobin: 9.1 g/dL — ABNORMAL LOW (ref 12.0–15.0)
MCH: 31.4 pg (ref 26.0–34.0)
MCHC: 33.7 g/dL (ref 30.0–36.0)
MCV: 93.1 fL (ref 80.0–100.0)
Platelets: 203 10*3/uL (ref 150–400)
RBC: 2.9 MIL/uL — ABNORMAL LOW (ref 3.87–5.11)
RDW: 12.7 % (ref 11.5–15.5)
WBC: 12.3 10*3/uL — ABNORMAL HIGH (ref 4.0–10.5)
nRBC: 0 % (ref 0.0–0.2)

## 2019-05-28 LAB — BIRTH TISSUE RECOVERY COLLECTION (PLACENTA DONATION)

## 2019-05-28 MED ORDER — IBUPROFEN 600 MG PO TABS
600.0000 mg | ORAL_TABLET | Freq: Four times a day (QID) | ORAL | Status: DC
  Administered 2019-05-28 – 2019-05-30 (×10): 600 mg via ORAL
  Filled 2019-05-28 (×11): qty 1

## 2019-05-28 NOTE — Progress Notes (Addendum)
POSTPARTUM PROGRESS NOTE  Subjective: Brittany Quinn is a 29 y.o. G3P0020 s/p scheduled PLTCS at [redacted]w[redacted]d for placenta previa. She reports she doing well. No acute events overnight. She has not passed flatus. Pain is moderately controlled, patient notes increased abdominal pain compared to yesterday. Lochia is normal. She denies any problems with ambulating, voiding or po intake. Denies nausea or vomiting, dizziness, fatigue.   Objective: Blood pressure (!) 97/41, pulse 87, temperature 98.4 F (36.9 C), temperature source Oral, resp. rate 18, height 5\' 3"  (1.6 m), weight 97.1 kg, last menstrual period 08/27/2018, SpO2 100 %.  Physical Exam:  General: alert, cooperative and no distress Chest: no respiratory distress, breath sounds clear throughout Abdomen: soft, normal active bowel sounds, lower quadrants appropriately tender near the c-section incision site Uterine Fundus: firm, appropriately tender Extremities: No calf swelling or tenderness  no extremity edema Skin: Lower abdomen covered with wound dressing from c-section. There is no notable drainage from the dressing and no surrounding erythema.   Recent Labs    05/27/19 1556 05/28/19 0509  HGB 10.3* 9.1*  HCT 31.3* 27.0*    Assessment/Plan: Brittany Quinn is a 29 y.o. G3P0020 s/p s/p scheduled PLTCS at [redacted]w[redacted]d for placenta previa.  Routine Postpartum Care: Doing well, pain well-controlled.  -- Continue routine care, lactation support  -- Contraception: None, patient declined by voiced understanding of the risks associated with early postpartum pregnancy -- Feeding: Breast  S/p LTCS: Routine postop care - Incentive spirometry  - Lovenox daily - Senokot, simethicone - Pain control with scheduled ibuprofen, prn oxycodone - Encouraged to continue ambulation  Low hemoglobin: Hgb down from 10.8 -> 9.1. She has been mildly hypotensive since surgery with pressures ranging from 97-115/40-60; most recent pressure 97/41. Patient denies  fatigue, dizziness, or excessive blood loss.  - Routine BP checks - Recheck Hgb   Dispo: Plan for discharge on 2/13 if patient continues to progress in postop recovery without complications.  Faris Almubaslat, Ms3 05/28/2019, 7:51 AM

## 2019-05-28 NOTE — Lactation Note (Signed)
This note was copied from a baby's chart. Lactation Consultation Note Baby 11 hrs old has no interest in feeding. Mom called wanting latch assistance. Baby wouldn't open mouth for feeding. Mom hand expressed breast w/no colostrum noted. Noted some edema to areola. reverse pressure to soften tissue, helpful. Nipple compressible afterwards. Baby licked a few times then open some, chin tug assisted to widen flange. Baby holding nipple in mouth. No attempts to suck. Baby sleeping. Discussed support and props during feeding. Newborn feeding habits discussed. Reported to RN.  Patient Name: Brittany Quinn [id number] Date: 05/28/2019 Reason for consult: Mother's request;Difficult latch;Early term 37-38.6wks   Maternal Data    Feeding Feeding Type: Breast Fed  LATCH Score Latch: Too sleepy or reluctant, no latch achieved, no sucking elicited.  Audible Swallowing: None  Type of Nipple: Everted at rest and after stimulation(short shaft)  Comfort (Breast/Nipple): Soft / non-tender  Hold (Positioning): Full assist, staff holds infant at breast  LATCH Score: 4  Interventions Interventions: Support pillows;Breast feeding basics reviewed;Assisted with latch;Position options;Skin to skin;Breast massage;Hand express;Breast compression;Adjust position  Lactation Tools Discussed/Used     Consult Status Consult Status: Follow-up Date: 05/28/19 Follow-up type: In-patient    Demontay Grantham, Diamond Nickel 05/28/2019, 1:21 AM

## 2019-05-28 NOTE — Lactation Note (Signed)
This note was copied from a baby's chart. Lactation Consultation Note  Patient Name: Brittany Quinn KDPTE'L Date: 05/28/2019 Reason for consult: Follow-up assessment;Early term 37-38.6wks  LC concurs with note provided by [facility] student, A. McCoy. Day shift nurse approached me earlier in the day with concerns about baby's feedings. When we entered the room, Brittany Quinn and her support person were watching a movie. She was in bed, and her support person with in the chair next to her. He was reclined with baby on his chest STS, and baby was asleep. They declined assistance.  I asked the night shift nurse to please call lactation to come in and assess latch tonight if parents were receptive to follow up.   Interventions Interventions: Breast feeding basics reviewed   Consult Status Consult Status: Follow-up Date: 05/28/19 Follow-up type: In-patient    Walker Shadow 05/28/2019, 10:45 PM

## 2019-05-28 NOTE — Lactation Note (Signed)
This note was copied from a baby's chart. Lactation Consultation Note  Patient Name: Brittany Quinn Date: 05/28/2019 Reason for consult: Follow-up assessment;Early term 65-38.6wks  Family denied support for baby "[name]" at this time. LC followed up with nurse and told nurse that is on her next visit in the room she notices any issues with a feeding to call out to [place] to come and support.  Maternal Data    Feeding Feeding Type: Breast Fed  LATCH Score                   Interventions    Lactation Tools Discussed/Used     Consult Status Consult Status: Follow-up Date: 05/28/19 Follow-up type: In-patient    Thersa Salt [facility] 05/28/2019, 9:11 PM

## 2019-05-29 NOTE — Progress Notes (Addendum)
POSTPARTUM PROGRESS NOTE  Post Partum Day 2  Subjective:  Brittany Quinn is a 29 y.o. [id number] s/p PLTCS at 18w0dfor low lying placenta.  She reports she is doing well. No acute events overnight. She denies any problems with ambulating, voiding or po intake. Denies nausea or vomiting.  Pain is well controlled.  Lochia is mild.  Objective: Blood pressure (!) 111/57, pulse 86, temperature 99.1 F (37.3 C), temperature source Oral, resp. rate 16, height '5\' 3"'  (1.6 m), weight 97.1 kg, last menstrual period 08/27/2018, SpO2 98 %, unknown if currently breastfeeding.  Physical Exam:  General: alert, cooperative and no distress Chest: no respiratory distress Heart:regular rate, distal pulses intact Abdomen: soft, nontender. Healing wound with no erythema or drainage. Honeycomb bandage well-applied. Uterine Fundus: firm, appropriately tender DVT Evaluation: No calf swelling or tenderness Extremities: no edema Skin: warm, dry  Recent Labs    05/27/19 1556 05/28/19 0509  HGB 10.3* 9.1*  HCT 31.3* 27.0*    Assessment/Plan: Brittany Schwallis a 29y.o. [id number]/p PLTCS at 37w0dor low lying placenta.  POD#2 - Doing well. Continue routine postpartum care.  Contraception: None Feeding: Breast Dispo: Plan for discharge tomorrow.   LOS: 2 days    JoGlenna DurandDO, PGY-1 OBGYN Faculty Teaching Service  05/29/2019, 8:01 AM  I personally saw and evaluated the patient, performing the key elements of the service. I developed and verified the management plan that is described in the resident's/student's note, and I agree with the content with my edits above. VSS, HRR&R, Resp unlabored, Legs neg.  FrNigel BertholdCNM 05/29/2019 8:17 AM

## 2019-05-29 NOTE — Anesthesia Postprocedure Evaluation (Signed)
Anesthesia Post Note  Patient: Senita Corredor  Procedure(s) Performed: CESAREAN SECTION (N/A )     Patient location during evaluation: PACU Anesthesia Type: Spinal Level of consciousness: awake and alert Pain management: pain level controlled Vital Signs Assessment: post-procedure vital signs reviewed and stable Respiratory status: spontaneous breathing Cardiovascular status: stable Postop Assessment: spinal receding Anesthetic complications: no    Last Vitals:  Vitals:   05/28/19 1422 05/29/19 0530  BP: 107/62 (!) 111/57  Pulse: 76 86  Resp: 18 16  Temp: 36.9 C 37.3 C  SpO2: 98% 98%    Last Pain:  Vitals:   05/29/19 0815  TempSrc:   PainSc: 0-No pain                 Lewie Loron

## 2019-05-29 NOTE — Lactation Note (Signed)
This note was copied from a baby's chart. Lactation Consultation Note  Patient Name: Brittany Quinn [id number] Date: 05/29/2019 Reason for consult: Follow-up assessment;Early term 68-38.6wks   Mom bottle feeding infant while infant is lying in bed with his head elevated slightly on pillow.    LC inquired about moms feeding goals.  Mom has just given first bottle of formula.  She states she still desires to bf but he's still struggling at times and she feels he needs the nutrients.  LC offered to assist with bf, mom declined.  Mom has no questions regarding pumping.  LC covered pumping purpose with mom.    Education discussed with mom about bottle feeding and having infant more upright when feeding and discussed paced bottle feeding with mom.  Sheet given for supplementation guidelines to bf.    Mom still desires to bf and states she will call out for assistance if needed.    Maternal Data    Feeding Feeding Type: Breast Fed  LATCH Score                   Interventions Interventions: Breast feeding basics reviewed  Lactation Tools Discussed/Used Tools: Pump   Consult Status Consult Status: Follow-up Date: 05/30/19 Follow-up type: In-patient    Maryruth Hancock [facility] 05/29/2019, 11:29 AM

## 2019-05-30 MED ORDER — OXYCODONE HCL 5 MG PO TABS: 5.0000 mg | ORAL_TABLET | Freq: Four times a day (QID) | ORAL | 0 refills | Status: DC | PRN

## 2019-05-30 MED ORDER — IBUPROFEN 800 MG PO TABS: 800.0000 mg | ORAL_TABLET | Freq: Three times a day (TID) | ORAL | 1 refills | Status: DC | PRN

## 2019-05-30 NOTE — Lactation Note (Signed)
This note was copied from a baby's chart. Lactation Consultation Note  Patient Name: Brittany Quinn Date: 05/30/2019 Reason for consult: Follow-up assessment   Baby 34 hours old and mother is breastfeeding, pumping and formula feeding. Mother recently pumped 60 ml. FOB states he wants baby to also formula feed in case mother cannot keep up with breastfeeding/supply demands. Mother states she has DEBP at home and she has manual. Baby has pacifier in his mouth.  Provided education on pacifier use. Suggest attempting to feed baby.  Mother did not want breastfeeding assistance but was willing to give baby bottle of breastmilk. Reviewed milk storage. Reviewed engorgement care and monitoring voids/stools. Feed on demand with cues.  Place baby STS if not cueing.     Maternal Data    Feeding Feeding Type: Bottle Fed - Breast Milk  LATCH Score                   Interventions Interventions: Breast feeding basics reviewed;DEBP  Lactation Tools Discussed/Used Tools: Pump   Consult Status Consult Status: Complete Date: 05/31/19 Follow-up type: In-patient    Dahlia Byes [facility] 05/30/2019, 12:40 PM

## 2019-06-10 ENCOUNTER — Other Ambulatory Visit: Payer: Self-pay

## 2019-06-10 ENCOUNTER — Ambulatory Visit (INDEPENDENT_AMBULATORY_CARE_PROVIDER_SITE_OTHER): Payer: Medicaid Other | Admitting: *Deleted

## 2019-06-10 VITALS — BP 139/86 | HR 60

## 2019-06-10 DIAGNOSIS — Z5189 Encounter for other specified aftercare: Secondary | ICD-10-CM

## 2019-06-10 NOTE — Progress Notes (Signed)
Patient ID: Brittany Quinn, female   DOB: 10-08-1990, 29 y.o.   MRN: [id number] Patient seen and assessed by nursing staff during this encounter. I have reviewed the chart and agree with the documentation and plan.  Scheryl Darter, MD 06/10/2019 1:44 PM

## 2019-06-10 NOTE — Progress Notes (Signed)
Here for wound check post c/s 05/27/19. Wound clean , dry, intact without redness, drainage or edema. BP wnl. Instructed to keep postpartum appointment as scheduled.  Go to [facility] if has severe headache, sudden edema, elevated bp.  Brittany Quinn

## 2019-06-25 ENCOUNTER — Encounter: Payer: Self-pay | Admitting: Obstetrics & Gynecology

## 2019-06-25 ENCOUNTER — Telehealth (INDEPENDENT_AMBULATORY_CARE_PROVIDER_SITE_OTHER): Payer: Medicaid Other | Admitting: Obstetrics & Gynecology

## 2019-06-25 VITALS — BP 133/94 | HR 75

## 2019-06-25 DIAGNOSIS — Z98891 History of uterine scar from previous surgery: Secondary | ICD-10-CM

## 2019-06-25 DIAGNOSIS — Z1389 Encounter for screening for other disorder: Secondary | ICD-10-CM

## 2019-06-25 NOTE — Patient Instructions (Signed)
Cesarean Delivery, Care After This sheet gives you information about how to care for yourself after your procedure. Your health care provider may also give you more specific instructions. If you have problems or questions, contact your health care provider. What can I expect after the procedure? After the procedure, it is common to have:  A small amount of blood or clear fluid coming from the incision.  Some redness, swelling, and pain in your incision area.  Some abdominal pain and soreness.  Vaginal bleeding (lochia). Even though you did not have a vaginal delivery, you will still have vaginal bleeding and discharge.  Pelvic cramps.  Fatigue. You may have pain, swelling, and discomfort in the tissue between your vagina and your anus (perineum) if:  Your C-section was unplanned, and you were allowed to labor and push.  An incision was made in the area (episiotomy) or the tissue tore during attempted vaginal delivery. Follow these instructions at home: Incision care   Follow instructions from your health care provider about how to take care of your incision. Make sure you: ? Wash your hands with soap and water before you change your bandage (dressing). If soap and water are not available, use hand sanitizer. ? If you have a dressing, change it or remove it as told by your health care provider. ? Leave stitches (sutures), skin staples, skin glue, or adhesive strips in place. These skin closures may need to stay in place for 2 weeks or longer. If adhesive strip edges start to loosen and curl up, you may trim the loose edges. Do not remove adhesive strips completely unless your health care provider tells you to do that.  Check your incision area every day for signs of infection. Check for: ? More redness, swelling, or pain. ? More fluid or blood. ? Warmth. ? Pus or a bad smell.  Do not take baths, swim, or use a hot tub until your health care provider says it's okay. Ask your health  care provider if you can take showers.  When you cough or sneeze, hug a pillow. This helps with pain and decreases the chance of your incision opening up (dehiscing). Do this until your incision heals. Medicines  Take over-the-counter and prescription medicines only as told by your health care provider.  If you were prescribed an antibiotic medicine, take it as told by your health care provider. Do not stop taking the antibiotic even if you start to feel better.  Do not drive or use heavy machinery while taking prescription pain medicine. Lifestyle  Do not drink alcohol. This is especially important if you are breastfeeding or taking pain medicine.  Do not use any products that contain nicotine or tobacco, such as cigarettes, e-cigarettes, and chewing tobacco. If you need help quitting, ask your health care provider. Eating and drinking  Drink at least 8 eight-ounce glasses of water every day unless told not to by your health care provider. If you breastfeed, you may need to drink even more water.  Eat high-fiber foods every day. These foods may help prevent or relieve constipation. High-fiber foods include: ? Whole grain cereals and breads. ? Brown rice. ? Beans. ? Fresh fruits and vegetables. Activity   If possible, have someone help you care for your baby and help with household activities for at least a few days after you leave the hospital.  Return to your normal activities as told by your health care provider. Ask your health care provider what activities are safe for   you.  Rest as much as possible. Try to rest or take a nap while your baby is sleeping.  Do not lift anything that is heavier than 10 lbs (4.5 kg), or the limit that you were told, until your health care provider says that it is safe.  Talk with your health care provider about when you can engage in sexual activity. This may depend on your: ? Risk of infection. ? How fast you heal. ? Comfort and desire to  engage in sexual activity. General instructions  Do not use tampons or douches until your health care provider approves.  Wear loose, comfortable clothing and a supportive and well-fitting bra.  Keep your perineum clean and dry. Wipe from front to back when you use the toilet.  If you pass a blood clot, save it and call your health care provider to discuss. Do not flush blood clots down the toilet before you get instructions from your health care provider.  Keep all follow-up visits for you and your baby as told by your health care provider. This is important. Contact a health care provider if:  You have: ? A fever. ? Bad-smelling vaginal discharge. ? Pus or a bad smell coming from your incision. ? Difficulty or pain when urinating. ? A sudden increase or decrease in the frequency of your bowel movements. ? More redness, swelling, or pain around your incision. ? More fluid or blood coming from your incision. ? A rash. ? Nausea. ? Little or no interest in activities you used to enjoy. ? Questions about caring for yourself or your baby.  Your incision feels warm to the touch.  Your breasts turn red or become painful or hard.  You feel unusually sad or worried.  You vomit.  You pass a blood clot from your vagina.  You urinate more than usual.  You are dizzy or light-headed. Get help right away if:  You have: ? Pain that does not go away or get better with medicine. ? Chest pain. ? Difficulty breathing. ? Blurred vision or spots in your vision. ? Thoughts about hurting yourself or your baby. ? New pain in your abdomen or in one of your legs. ? A severe headache.  You faint.  You bleed from your vagina so much that you fill more than one sanitary pad in one hour. Bleeding should not be heavier than your heaviest period. Summary  After the procedure, it is common to have pain at your incision site, abdominal cramping, and slight bleeding from your vagina.  Check  your incision area every day for signs of infection.  Tell your health care provider about any unusual symptoms.  Keep all follow-up visits for you and your baby as told by your health care provider. This information is not intended to replace advice given to you by your health care provider. Make sure you discuss any questions you have with your health care provider. Document Revised: 10/09/2017 Document Reviewed: 10/09/2017 Elsevier Patient Education  2020 Elsevier Inc.  

## 2019-06-25 NOTE — Progress Notes (Signed)
I connected with Brittany Quinn on 06/25/19 at  1:55 PM EST by: MyChart and verified that I am speaking with the correct person using two identifiers.  Patient is located at her home and provider is located at [facility].     The purpose of this virtual visit is to provide medical care while limiting exposure to the novel coronavirus. I discussed the limitations, risks, security and privacy concerns of performing an evaluation and management service by videochat and the availability of in person appointments. I also discussed with the patient that there may be a patient responsible charge related to this service. By engaging in this virtual visit, you consent to the provision of healthcare.  Additionally, you authorize for your insurance to be billed for the services provided during this visit.  The patient expressed understanding and agreed to proceed.    Post Partum Visit Note Subjective:    Ms. Brittany Quinn is a 29 y.o. G57P1021 female who presents for a postpartum visit. She is 4 weeks postpartum following a low cervical transverse Cesarean section. I have fully reviewed the prenatal and intrapartum course. The delivery was at 37 gestational weeks. Outcome: primary cesarean section, low transverse incision. Anesthesia: spinal. Postpartum course has been good. Baby's course has been normal. Baby is feeding by both breast and bottle - Carn[employer] Good Start. Bleeding no bleeding. Bowel function is normal. Bladder function is normal. Patient is not sexually active. Contraception method is none. Postpartum depression screening: negative.  The following portions of the patient's history were reviewed and updated as appropriate: allergies, current medications, past family history, past medical history, past social history, past surgical history and problem list.  Review of Systems Pertinent items are noted in HPI.   Objective:  There were no vitals filed for this visit. Self-Obtained       Assessment:    normal postpartum exam. Pap smear not done at today's visit. Last pap smear 01/2017 and results were normal. Next pap due 01/2020.   Plan:    1. Contraception: none 2. Doing well 3. Follow up as needed.   15 minutes of non-face-to-face time spent with the patient   Henrietta Dine [facility] 06/25/2019 1:47 PM

## 2019-06-25 NOTE — Progress Notes (Signed)
Patient does not want to discuss birth control today.

## 2019-06-30 DIAGNOSIS — Z029 Encounter for administrative examinations, unspecified: Secondary | ICD-10-CM

## 2019-08-25 ENCOUNTER — Other Ambulatory Visit: Payer: Self-pay

## 2019-08-25 ENCOUNTER — Encounter (HOSPITAL_COMMUNITY): Payer: Self-pay | Admitting: Emergency Medicine

## 2019-08-25 ENCOUNTER — Emergency Department (HOSPITAL_COMMUNITY)
Admission: EM | Admit: 2019-08-25 | Discharge: 2019-08-25 | Disposition: A | Discharge status: Home / Self Care | Payer: Medicaid Other | Attending: Emergency Medicine | Admitting: Emergency Medicine

## 2019-08-25 DIAGNOSIS — N644 Mastodynia: Secondary | ICD-10-CM | POA: Insufficient documentation

## 2019-08-25 MED ORDER — KETOROLAC TROMETHAMINE 60 MG/2ML IM SOLN
60.0000 mg | Freq: Once | INTRAMUSCULAR | Status: AC
  Administered 2019-08-25: 60 mg via INTRAMUSCULAR
  Filled 2019-08-25: qty 2

## 2019-08-25 MED ORDER — DICLOXACILLIN SODIUM 500 MG PO CAPS: 500.0000 mg | ORAL_CAPSULE | Freq: Four times a day (QID) | ORAL | 0 refills | Status: DC

## 2019-08-25 MED ORDER — PROBIOTIC PO CAPS: 1.0000 | ORAL_CAPSULE | Freq: Every day | ORAL | 1 refills | Status: DC

## 2019-08-25 MED ORDER — DICLOXACILLIN SODIUM 250 MG PO CAPS
500.0000 mg | ORAL_CAPSULE | Freq: Once | ORAL | Status: AC
  Administered 2019-08-25: 500 mg via ORAL
  Filled 2019-08-25: qty 2

## 2019-08-25 NOTE — Discharge Instructions (Addendum)
You may alternate Tylenol 1000 mg every 6 hours as needed for pain, fever and Ibuprofen 800 mg every 8 hours as needed for pain, fever.  Please take Ibuprofen with food.  Do not take more than 4000 mg of Tylenol (acetaminophen) in a 24 hour period.  I recommend frequent pumping and completely emptying your breast which can help prevent mastitis and further breast pain.  I do not feel or appreciate signs of a milk bleb or clogged duct today nor other signs of cellulitis or abscess on your exam but if your symptoms continue, I recommend close follow-up with a primary care physician or your OB/GYN.   Steps to find a Primary Care Provider (PCP):  Call [phone] or [phone] to access "[place] Find a Doctor Service."  2.  You may also go on the [facility] website at [email or web address]  3.  [facility] also frequently accepts new patients.  [facility]  [address] [place] [address] [phone]  4.  There are also multiple [facility], [facility] and [facility] practices throughout [place] that are frequently accepting new patients. You may find a clinic that is close to your home and contact them.  [facility] [email or web address] [phone]  [facility] [email or web address]  [email or web address] [phone]  [facility] [email or web address] [phone]  5.  Local Health Departments also can provide primary care services.  [facility]  [address] [place] [address] [phone]  [facility] Department [address] [place] [address] [phone]  [facility] [address] [place] [address]  [place] [address] [phone]

## 2019-08-25 NOTE — ED Provider Notes (Signed)
TIME SEEN: 5:24 AM  CHIEF COMPLAINT: Right-sided breast pain  HPI: Patient is a 29 year old female who presents to the emergency department with several days of right-sided breast pain.  Initially felt a knot just proximal to the nipple on the underside of the breast.  Reports that she is pumping only.  No direct breast-feeding.  She denies any fevers or chills.  No redness or warmth to the breast.  She states she is having pain with pumping because of this.  She feels that she is having normal milk drainage.  No purulence coming from the nipple or blood.  ROS: See HPI Constitutional: no fever  Eyes: no drainage  ENT: no runny nose   Cardiovascular:  no chest pain  Resp: no SOB  GI: no vomiting GU: no dysuria Integumentary: no rash  Allergy: no hives  Musculoskeletal: no leg swelling  Neurological: no slurred speech ROS otherwise negative  PAST MEDICAL HISTORY/PAST SURGICAL HISTORY:  Past Medical History:  Diagnosis Date  . Medical history non-contributory   . Trichomonas     MEDICATIONS:  Prior to Admission medications   Medication Sig Start Date End Date Taking? Authorizing Provider  Blood Pressure Monitoring (BLOOD PRESSURE KIT) DEVI 1 Device by Does not apply route as needed. ICD 10 Z34.90 12/02/18   [name], NP  cetirizine (ZYRTEC) 10 MG tablet Take 10 mg by mouth daily.    [provider]  dicloxacillin (DYNAPEN) 500 MG capsule Take 1 capsule (500 mg total) by mouth 4 (four) times daily. 08/25/19   Jay Haskew, Delice Bison, DO  ibuprofen (ADVIL) 800 MG tablet Take 1 tablet (800 mg total) by mouth every 8 (eight) hours as needed. 05/30/19   Marcille Buffy D, CNM  oxyCODONE (OXY IR/ROXICODONE) 5 MG immediate release tablet Take 1-2 tablets (5-10 mg total) by mouth every 6 (six) hours as needed for moderate pain. Patient not taking: Reported on 06/25/2019 05/30/19   Marcille Buffy D, CNM  Prenatal Vit-Fe Phos-FA-Omega (VITAFOL GUMMIES) 3.33-0.333-34.8 MG CHEW Chew 3 each  by mouth daily. 12/17/18   Burleson, Rona Ravens, NP  Probiotic CAPS Take 1 capsule by mouth daily. 08/25/19   Abhay Godbolt, Delice Bison, DO    ALLERGIES:  No Known Allergies  SOCIAL HISTORY:  Social History   Tobacco Use  . Smoking status: Former Smoker    Types: Cigars    Quit date: 09/01/2018    Years since quitting: 0.9  . Smokeless tobacco: Never Used  . Tobacco comment: Blacks  Substance Use Topics  . Alcohol use: Not Currently    Comment: occ    FAMILY HISTORY: Family History  Problem Relation Age of Onset  . Breast cancer Mother   . Cancer Father        prostate  . Diabetes Father     EXAM: BP 125/84 (BP Location: Right Arm)   Pulse 75   Temp 98.2 F (36.8 C) (Oral)   Resp 16   SpO2 100%  CONSTITUTIONAL: Alert and oriented and responds appropriately to questions. Well-appearing; well-nourished, afebrile, nontoxic, in no distress HEAD: Normocephalic EYES: Conjunctivae clear, pupils appear equal, EOM appear intact ENT: normal nose; moist mucous membranes NECK: Supple, normal ROM CARD: RRR; S1 and S2 appreciated; no murmurs, no clicks, no rubs, no gallops BREAST: Patient is tender to palpation on the underside of the right breast but there is no mass, fluctuance, induration, redness or warmth appreciated.  Her nipple appears normal without any lesions, cracks or bleeding.  I do not  appreciate any milk blebs and she appears to be draining milk appropriately from her nipple.  No bleeding or discharge noted. RESP: Normal chest excursion without splinting or tachypnea; breath sounds clear and equal bilaterally; no wheezes, no rhonchi, no rales, no hypoxia or respiratory distress, speaking full sentences ABD/GI: Normal bowel sounds; non-distended; soft, non-tender, no rebound, no guarding, no peritoneal signs, no hepatosplenomegaly BACK:  The back appears normal EXT: Normal ROM in all joints; no deformity noted, no edema; no cyanosis SKIN: Normal color for age and race; warm; no rash  on exposed skin NEURO: Moves all extremities equally PSYCH: The patient's mood and manner are appropriate.   MEDICAL DECISION MAKING: Patient here the breast pain.  She states she does not feel she is able to pump as frequently are empty normally due to discomfort.  Discussed with patient that this can lead to mastitis.  She is not red, warm today and not having fevers or other flulike symptoms.  Will place on dicloxacillin in case this is early mastitis and have encouraged her to empty her breast frequently and fully and alternate Tylenol and Motrin for pain.  Recommended warm compresses.  She has no sign of breast abscess or cellulitis on exam.  I do not appreciate any abnormal mass.  There is no milk bleb or sign of clogged duct.  Recommended close follow-up with her OB/GYN but have also provided her with primary care follow-up if symptoms are not improving.  Given Toradol here for discomfort.  At this time, I do not feel there is any life-threatening condition present. I have reviewed, interpreted and discussed all results (EKG, imaging, lab, urine as appropriate) and exam findings with patient/family. I have reviewed nursing notes and appropriate previous records.  I feel the patient is safe to be discharged home without further emergent workup and can continue workup as an outpatient as needed. Discussed usual and customary return precautions. Patient/family verbalize understanding and are comfortable with this plan.  Outpatient follow-up has been provided as needed. All questions have been answered.   Brittany Quinn was evaluated in Emergency Department on 08/25/2019 for the symptoms described in the history of present illness. She was evaluated in the context of the global COVID-19 pandemic, which necessitated consideration that the patient might be at risk for infection with the SARS-CoV-2 virus that causes COVID-19. Institutional protocols and algorithms that pertain to the evaluation of patients at  risk for COVID-19 are in a state of rapid change based on information released by regulatory bodies including the CDC and federal and state organizations. These policies and algorithms were followed during the patient's care in the ED.      Kendria Halberg, Delice Bison, DO 08/25/19 [phone]

## 2019-08-25 NOTE — ED Triage Notes (Signed)
Pt reports breast pain for 2 days with a new lump under nipple pt- pt states she is 3 months postpartum and breast feeding and pumping. Pain is worse with pumping or feeding. No bloody discharge from nipple only milk.

## 2019-08-27 ENCOUNTER — Telehealth: Payer: Self-pay | Admitting: Lactation Services

## 2019-08-27 ENCOUNTER — Ambulatory Visit: Payer: Medicaid Other | Admitting: Obstetrics & Gynecology

## 2019-08-27 NOTE — Telephone Encounter (Signed)
Called patient due to right  breast pain. Patient reports bad pain in the breast for 4 days. She started on ATB 2 days ago.   She reports it is warm to the touch and is slightly red. She denies a fever.   Patient has been alternating warm and cold compresses and is not helping.   She is trying to pump every 2 hours and is only getting it to drip. Infant has not latched since 20 months of age. She is getting more out with hand expression.   Patient reports she is engorged. She reports a lot of pain in the breast. She reports there is increased swelling to the nipple. She has tried a warm shower and noted some white clumpiness coming out of the breast.   Patient is taking 800 Ibuprofen every 4 hours and alternating with Tylenol 1000 mg. Advised patient to take Ibuprofen every 8 hours and continue to alternate with Tylenol so she is taking something every 4 hours.   Patient reports she typically pumps about every 2-3.5 hours. She has had a few times lately where infant is sleeping longer and she did not get up to pump. There were no other risk factors for plugged ducts noted.   Discussed trying Sunflower Lecithin to help with plugs. Patient aware that she will need to be referred to the [facility] is she is not improving after following plan.   Plan:   1. Warm compresses for 20 minutes every 2 hours 2. Reverse pressure to the right breast 3. Pumping or hand expressing the breast 4. Continue Ibuprofen and Tylenol, alternating every 4 hours 5. Sunflower Lecithin 1200 mg QID 6. Continue Antibiotics as prescribed 7. Will call patient again tomorrow to see if she is better.

## 2019-08-27 NOTE — Telephone Encounter (Signed)
Patient was assessed and managed by nursing staff during this encounter. I have reviewed the chart and agree with the documentation and plan.   Jaynie Collins, MD 08/27/2019 2:38 PM

## 2019-08-28 ENCOUNTER — Telehealth (INDEPENDENT_AMBULATORY_CARE_PROVIDER_SITE_OTHER): Payer: Medicaid Other | Admitting: Lactation Services

## 2019-08-28 ENCOUNTER — Telehealth: Payer: Self-pay | Admitting: Lactation Services

## 2019-08-28 DIAGNOSIS — N644 Mastodynia: Secondary | ICD-10-CM

## 2019-08-28 DIAGNOSIS — O9279 Other disorders of lactation: Secondary | ICD-10-CM

## 2019-08-28 DIAGNOSIS — O9229 Other disorders of breast associated with pregnancy and the puerperium: Secondary | ICD-10-CM

## 2019-08-28 NOTE — Telephone Encounter (Signed)
aAlled patient to check on the status of her plugged duct. She did not answer. LM for patient to call the office at her earliest convenience.

## 2019-08-28 NOTE — Telephone Encounter (Signed)
Patient reports she feels so much better today. She has been able to open the plug and milk is draining well. She is still tender to touch but so much better.   Patient is continuing the Ibuprofen, Tylenol and Antibiotics.   Patient to call back as needed.

## 2020-03-22 ENCOUNTER — Encounter: Payer: Self-pay | Admitting: Obstetrics & Gynecology

## 2020-03-22 ENCOUNTER — Other Ambulatory Visit: Payer: Self-pay

## 2020-03-22 ENCOUNTER — Ambulatory Visit (INDEPENDENT_AMBULATORY_CARE_PROVIDER_SITE_OTHER): Payer: Medicaid Other | Admitting: Obstetrics & Gynecology

## 2020-03-22 ENCOUNTER — Other Ambulatory Visit (HOSPITAL_COMMUNITY)
Admission: RE | Admit: 2020-03-22 | Discharge: 2020-03-22 | Disposition: A | Discharge status: Home / Self Care | Payer: Medicaid Other | Source: Ambulatory Visit | Attending: Obstetrics & Gynecology | Admitting: Obstetrics & Gynecology

## 2020-03-22 ENCOUNTER — Other Ambulatory Visit: Payer: Self-pay | Admitting: Obstetrics & Gynecology

## 2020-03-22 VITALS — BP 122/80 | HR 93

## 2020-03-22 DIAGNOSIS — Z01419 Encounter for gynecological examination (general) (routine) without abnormal findings: Secondary | ICD-10-CM | POA: Diagnosis not present

## 2020-03-22 DIAGNOSIS — N643 Galactorrhea not associated with childbirth: Secondary | ICD-10-CM | POA: Diagnosis not present

## 2020-03-22 DIAGNOSIS — E669 Obesity, unspecified: Secondary | ICD-10-CM

## 2020-03-22 NOTE — Progress Notes (Unsigned)
Patient ID: Brittany Quinn, female   DOB: [birth year], 29 y.o.   MRN: [id number]  Chief Complaint  Patient presents with  . Gynecologic Exam    HPI Brittany Quinn is a 29 y.o. female.  [id number] Patient's last menstrual period was 03/08/2020. She still has a sensation of a soft mass at her nipple on the right, having stopped nursing approximately July. No nipple discharge reported, no pain. Regular menses HPI  Past Medical History:  Diagnosis Date  . Medical history non-contributory   . Trichomonas     Past Surgical History:  Procedure Laterality Date  . CESAREAN SECTION N/A 05/27/2019   Procedure: CESAREAN SECTION;  Surgeon: Kathrynn Running, MD;  Location: [facility] LD [facility]S;  Service: Obstetrics;  Laterality: N/A;  . LAPAROSCOPY  06/23/2011   Procedure: LAPAROSCOPY OPERATIVE;  Surgeon: Tereso Newcomer, MD;  Location: [facility] ORS;  Service: Gynecology;  Laterality: Left;  Diagnostic laparoscopy  . LAPAROTOMY  06/23/2011   Procedure: LAPAROTOMY;  Surgeon: Tereso Newcomer, MD;  Location: [facility] ORS;  Service: Gynecology;  Laterality: Left;  Wedge resection of Ruptured left cornual ectopic pregnancy with left salpingectomy    Family History  Problem Relation Age of Onset  . Breast cancer Mother   . Cancer Father        prostate  . Diabetes Father     Social History Social History   Tobacco Use  . Smoking status: Former Smoker    Types: Cigars    Quit date: 09/01/2018    Years since quitting: 1.5  . Smokeless tobacco: Never Used  . Tobacco comment: Blacks  Vaping Use  . Vaping Use: Never used  Substance Use Topics  . Alcohol use: Not Currently    Comment: occ  . Drug use: Yes    Types: Marijuana    Comment: last use May 2020    No Known Allergies  No current outpatient medications on file.   No current facility-administered medications for this visit.    Review of Systems Review of Systems  Constitutional: Negative.   Respiratory: Negative.   Cardiovascular: Negative.    Gastrointestinal: Negative.   Genitourinary: Negative.   Neurological: Negative.     Blood pressure 122/80, pulse 93, last menstrual period 03/08/2020, not currently breastfeeding.  Physical Exam Physical Exam Vitals and nursing note reviewed. Exam conducted with a chaperone present.  Constitutional:      Appearance: She is obese. She is not ill-appearing.  HENT:     Nose: Nose normal.  Eyes:     Pupils: Pupils are equal, round, and reactive to light.  Cardiovascular:     Rate and Rhythm: Normal rate.  Pulmonary:     Effort: Pulmonary effort is normal.  Chest:     Breasts:        Right: Nipple discharge present. No mass, skin change or tenderness.        Left: Nipple discharge present. No mass, skin change or tenderness.  Abdominal:     General: Abdomen is flat.     Palpations: Abdomen is soft.  Genitourinary:    General: Normal vulva.     Labia:        Right: No tenderness.        Left: No tenderness.      Vagina: Normal.     Cervix: Normal.     Uterus: Normal.      Adnexa:        Right: No mass.  Left: No mass.    Musculoskeletal:     Cervical back: Normal range of motion.  Lymphadenopathy:     Upper Body:     Right upper body: No axillary adenopathy.     Left upper body: No axillary adenopathy.  Neurological:     Mental Status: She is alert.  Psychiatric:        Mood and Affect: Mood normal.        Behavior: Behavior normal.     Data Reviewed Pap normal 01/2017  Assessment Galactorrhea of both breasts - Plan: Prolactin, [place] Unlisted Procedure Breast  Well woman exam with routine gynecological exam    Plan F/u evaluation of breast symptoms     Scheryl Darter 03/22/2020, 11:16 AM

## 2020-03-22 NOTE — Patient Instructions (Signed)
Galactorrhea Galactorrhea is an abnormal milky discharge from the breast. The discharge may come from one or both nipples. The fluid is often white, yellow, or green. It is different from the normal milk produced in nursing mothers. Galactorrhea usually occurs in women, but it can sometimes affect men. Various things can cause galactorrhea, such as:  Irritation of the breast, which can result from injury, stimulation during sexual activity, or clothes rubbing against the nipple.  Medicines.  Changes in hormone levels. In many cases, galactorrhea will go away without treatment. However, galactorrhea can also be a sign of something more serious, such as diseases of the kidney or thyroid, or problems with the pituitary gland. Your health care provider may do various tests to help determine the cause. Sometimes the cause is unknown. It is important to monitor your condition to make sure that it goes away. Follow these instructions at home:  Breast care  Watch your condition for any changes.  Do not squeeze your breasts or nipples.  Avoid breast stimulation during sexual activity.  Perform a breast self-exam once a month. Doing this more often can irritate your breasts.  Avoid clothes that rub on your nipples.  Use breast pads to absorb the discharge.  Wear a breast binder or a support bra to help prevent clothes from rubbing on your nipples. General instructions  Take over-the-counter and prescription medicines only as told by your health care provider.  Keep all follow-up visits as told by your health care provider. This is important. Contact a health care provider if you:  Develop hot flashes, vaginal dryness, or a lack of sexual desire.  Stop having menstrual periods, or they are irregular or far apart.  Have headaches.  Have vision problems. Get help right away if you:  Have breast discharge that is bloody or pus-like.  Have breast pain.  Feel a lump in your  breast.  Have wrinkling or dimpling on your breast.  Notice that your breast becomes red and swollen. Summary  Galactorrhea is an abnormal milky discharge from the breast. The fluid may come from one or both nipples and is often white, yellow, or green.  Galactorrhea may be caused by various things, such as irritation of the nipples, medicines, or changes in hormone levels.  Galactorrhea often goes away without treatment. However, it also may be a sign of something more serious, such as diseases of the kidney or thyroid, or problems with the pituitary gland.  Get help right away if you have discharge that is bloody or pus-like, if you have breast pain or a lump, or if you have skin changes on your breast. This information is not intended to replace advice given to you by your health care provider. Make sure you discuss any questions you have with your health care provider. Document Revised: 03/15/2017 Document Reviewed: 02/27/2017 Elsevier Patient Education  2020 Elsevier Inc.    

## 2020-03-22 NOTE — Progress Notes (Signed)
Breast U/S and diagnostic mammogram scheduled for January 12th @ 0920.  Pt notified.   Addison Naegeli, RN  03/22/20

## 2020-03-23 LAB — PROLACTIN: Prolactin: 5.9 ng/mL (ref 4.8–23.3)

## 2020-03-24 ENCOUNTER — Other Ambulatory Visit: Payer: Self-pay | Admitting: Advanced Practice Midwife

## 2020-03-24 LAB — CYTOLOGY - PAP
Comment: NEGATIVE
Diagnosis: NEGATIVE
High risk HPV: NEGATIVE

## 2020-04-27 ENCOUNTER — Other Ambulatory Visit: Payer: Medicaid Other

## 2020-06-03 ENCOUNTER — Other Ambulatory Visit: Payer: Self-pay

## 2020-06-03 ENCOUNTER — Ambulatory Visit
Admission: RE | Admit: 2020-06-03 | Discharge: 2020-06-03 | Disposition: A | Discharge status: Home / Self Care | Payer: Medicaid Other | Source: Ambulatory Visit | Attending: Obstetrics & Gynecology | Admitting: Obstetrics & Gynecology

## 2020-06-03 ENCOUNTER — Other Ambulatory Visit: Payer: Self-pay | Admitting: Obstetrics & Gynecology

## 2020-06-03 DIAGNOSIS — N643 Galactorrhea not associated with childbirth: Secondary | ICD-10-CM

## 2020-06-20 ENCOUNTER — Other Ambulatory Visit: Payer: Self-pay | Admitting: Obstetrics & Gynecology

## 2020-06-20 ENCOUNTER — Ambulatory Visit
Admission: RE | Admit: 2020-06-20 | Discharge: 2020-06-20 | Disposition: A | Discharge status: Home / Self Care | Payer: Medicaid Other | Source: Ambulatory Visit | Attending: Obstetrics & Gynecology | Admitting: Obstetrics & Gynecology

## 2020-06-20 ENCOUNTER — Other Ambulatory Visit: Payer: Self-pay

## 2020-06-20 ENCOUNTER — Other Ambulatory Visit: Payer: Medicaid Other

## 2020-06-20 DIAGNOSIS — N643 Galactorrhea not associated with childbirth: Secondary | ICD-10-CM

## 2020-06-20 DIAGNOSIS — R2231 Localized swelling, mass and lump, right upper limb: Secondary | ICD-10-CM

## 2020-08-26 ENCOUNTER — Ambulatory Visit (HOSPITAL_COMMUNITY)
Admission: EM | Admit: 2020-08-26 | Discharge: 2020-08-26 | Disposition: A | Discharge status: Home / Self Care | Payer: Medicaid Other | Attending: Student | Admitting: Student

## 2020-08-26 ENCOUNTER — Other Ambulatory Visit: Payer: Self-pay

## 2020-08-26 ENCOUNTER — Encounter (HOSPITAL_COMMUNITY): Payer: Self-pay

## 2020-08-26 DIAGNOSIS — Z20822 Contact with and (suspected) exposure to covid-19: Secondary | ICD-10-CM | POA: Diagnosis not present

## 2020-08-26 DIAGNOSIS — Z1152 Encounter for screening for COVID-19: Secondary | ICD-10-CM

## 2020-08-26 NOTE — ED Provider Notes (Signed)
[facility]    CSN: [id number] Arrival date & time: 08/26/20  0854      History   Chief Complaint Chief Complaint  Patient presents with  . URI  . Sore Throat  . Generalized Body Aches  . Diarrhea    HPI Brittany Quinn is a 30 y.o. female presenting with 2 days of viral URI symptoms following exposure to COVID, states she is here for a COVID test.  Medical history noncontributory.  Notes 2 days of nasal drainage, sore throat, generalized body aches, diarrhea.  1 episode of watery diarrhea today, states she had many episodes yesterday but unable to quantify this.  Denies nausea, vomiting, abdominal pain.  Denies subjective chills or fevers, has not monitored her temperature at home.  Minimal cough.  Has not taken any medications for her symptoms. Denies fevers/chills, n/v, shortness of breath, chest pain, facial pain, teeth pain, headaches, loss of taste/smell, swollen lymph nodes, ear pain.    HPI  Past Medical History:  Diagnosis Date  . Medical history non-contributory   . Trichomonas     Patient Active Problem List   Diagnosis Date Noted  . Obesity 03/22/2020  . Status post primary low transverse cesarean section 05/27/2019  . Former smoker 12/17/2018    Past Surgical History:  Procedure Laterality Date  . CESAREAN SECTION N/A 05/27/2019   Procedure: CESAREAN SECTION;  Surgeon: Kathrynn Running, MD;  Location: [facility] LD ORS;  Service: Obstetrics;  Laterality: N/A;  . LAPAROSCOPY  06/23/2011   Procedure: LAPAROSCOPY OPERATIVE;  Surgeon: Tereso Newcomer, MD;  Location: [facility] ORS;  Service: Gynecology;  Laterality: Left;  Diagnostic laparoscopy  . LAPAROTOMY  06/23/2011   Procedure: LAPAROTOMY;  Surgeon: Tereso Newcomer, MD;  Location: [facility] ORS;  Service: Gynecology;  Laterality: Left;  Wedge resection of Ruptured left cornual ectopic pregnancy with left salpingectomy    OB History    Gravida  3   Para  1   Term  1   Preterm      AB  2   Living  1     SAB   1   IAB      Ectopic  1   Multiple  0   Live Births  1            Home Medications    Prior to Admission medications   Not on File    Family History Family History  Problem Relation Age of Onset  . Breast cancer Mother        around 34  . Cancer Father        prostate  . Diabetes Father   . Breast cancer Maternal Aunt   . Breast cancer Cousin   . Breast cancer Maternal Aunt     Social History Social History   Tobacco Use  . Smoking status: Former Smoker    Types: Cigars    Quit date: 09/01/2018    Years since quitting: 1.9  . Smokeless tobacco: Never Used  . Tobacco comment: Blacks  Vaping Use  . Vaping Use: Never used  Substance Use Topics  . Alcohol use: Not Currently    Comment: occ  . Drug use: Yes    Types: Marijuana    Comment: last use May 2020     Allergies   Patient has no known allergies.   Review of Systems Review of Systems  Constitutional: Negative for appetite change, chills and fever.  HENT: Positive for congestion and sore  throat. Negative for ear pain, rhinorrhea, sinus pressure, sinus pain, tinnitus, trouble swallowing and voice change.   Eyes: Negative for redness and visual disturbance.  Respiratory: Positive for cough. Negative for chest tightness, shortness of breath and wheezing.   Cardiovascular: Negative for chest pain and palpitations.  Gastrointestinal: Positive for diarrhea. Negative for abdominal pain, constipation, nausea and vomiting.  Genitourinary: Negative for dysuria, frequency and urgency.  Musculoskeletal: Negative for myalgias.  Neurological: Negative for dizziness, weakness and headaches.  Psychiatric/Behavioral: Negative for confusion.  All other systems reviewed and are negative.    Physical Exam Triage Vital Signs ED Triage Vitals  Enc Vitals Group     BP 08/26/20 0930 96/79     Pulse Rate 08/26/20 0930 82     Resp 08/26/20 0930 17     Temp 08/26/20 0930 99.2 F (37.3 C)     Temp Source  08/26/20 0930 Oral     SpO2 08/26/20 0930 100 %     Weight --      Height --      Head Circumference --      Peak Flow --      Pain Score 08/26/20 0933 6     Pain Loc --      Pain Edu? --      Excl. in GC? --    No data found.  Updated Vital Signs BP 96/79 (BP Location: Right Arm)   Pulse 82   Temp 99.2 F (37.3 C) (Oral)   Resp 17   LMP 08/08/2020   SpO2 100%   Visual Acuity Right Eye Distance:   Left Eye Distance:   Bilateral Distance:    Right Eye Near:   Left Eye Near:    Bilateral Near:     Physical Exam Vitals reviewed.  Constitutional:      General: She is not in acute distress.    Appearance: Normal appearance. She is not ill-appearing.  HENT:     Head: Normocephalic and atraumatic.     Right Ear: Hearing, tympanic membrane, ear canal and external ear normal. No swelling or tenderness. There is no impacted cerumen. No mastoid tenderness. Tympanic membrane is not perforated, erythematous, retracted or bulging.     Left Ear: Hearing, tympanic membrane, ear canal and external ear normal. No swelling or tenderness. There is no impacted cerumen. No mastoid tenderness. Tympanic membrane is not perforated, erythematous, retracted or bulging.     Nose:     Right Sinus: No maxillary sinus tenderness or frontal sinus tenderness.     Left Sinus: No maxillary sinus tenderness or frontal sinus tenderness.     Mouth/Throat:     Mouth: Mucous membranes are moist.     Pharynx: Uvula midline. Posterior oropharyngeal erythema present. No oropharyngeal exudate.     Tonsils: No tonsillar exudate.     Comments: Smooth erythema posterior pharynx Cardiovascular:     Rate and Rhythm: Normal rate and regular rhythm.     Heart sounds: Normal heart sounds.  Pulmonary:     Breath sounds: Normal breath sounds and air entry. No wheezing, rhonchi or rales.  Chest:     Chest wall: No tenderness.  Abdominal:     General: Abdomen is flat. Bowel sounds are normal.     Tenderness: There  is no abdominal tenderness. There is no guarding or rebound.  Lymphadenopathy:     Cervical: No cervical adenopathy.  Skin:    Capillary Refill: Capillary refill takes less than 2 seconds.  Neurological:  General: No focal deficit present.     Mental Status: She is alert and oriented to person, place, and time.  Psychiatric:        Attention and Perception: Attention and perception normal.        Mood and Affect: Mood and affect normal.        Behavior: Behavior normal. Behavior is cooperative.        Thought Content: Thought content normal.        Judgment: Judgment normal.      UC Treatments / Results  Labs (all labs ordered are listed, but only abnormal results are displayed) Labs Reviewed  SARS CORONAVIRUS 2 (TAT 6-24 HRS)    EKG   Radiology No results found.  Procedures Procedures (including critical care time)  Medications Ordered in UC Medications - No data to display  Initial Impression / Assessment and Plan / UC Course  I have reviewed the triage vital signs and the nursing notes.  Pertinent labs & imaging results that were available during my care of the patient were reviewed by me and considered in my medical decision making (see chart for details).     This patient is a 30 year old female presenting with viral URI symptoms following exposure to COVID, here today for COVID test. Today this pt is afebrile nontachycardic nontachypneic, oxygenating well on room air, no wheezes rhonchi or rales.   COVID PCR sent Patient declines prescription management in favor of over-the-counter medications.  I am in agreement with this  Plan.  ED return precautions discussed.  Final Clinical Impressions(s) / UC Diagnoses   Final diagnoses:  Suspected COVID-19 virus infection  Exposure to COVID-19 virus  Encounter for screening for COVID-19     Discharge Instructions     -For fever/chills, body aches, headaches- Take Tylenol 1000 mg 3 times daily, and  ibuprofen 800 mg 3 times daily with food.  You can take these together, or alternate every 3-4 hours. -Make sure to rest and drink plenty of fluids. -Seek additional medical attention if you develop new symptoms like shortness of breath, chest pain, dizziness. -With a virus, including Covid, you're typically contagious for 5-7 days, or as long as you're having fevers. Work note provided.     ED Prescriptions    None     PDMP not reviewed this encounter.   Rhys Martini, PA-C 08/26/20 1020

## 2020-08-26 NOTE — ED Triage Notes (Signed)
Pt presents with nasal drainage, sore throat, generalized body aches, and diarrhea X 2 days.

## 2020-08-26 NOTE — Discharge Instructions (Addendum)
-  For fever/chills, body aches, headaches- Take Tylenol 1000 mg 3 times daily, and ibuprofen 800 mg 3 times daily with food.  You can take these together, or alternate every 3-4 hours. -Make sure to rest and drink plenty of fluids. -Seek additional medical attention if you develop new symptoms like shortness of breath, chest pain, dizziness. -With a virus, including Covid, you're typically contagious for 5-7 days, or as long as you're having fevers. Work note provided.

## 2020-08-27 LAB — SARS CORONAVIRUS 2 (TAT 6-24 HRS): SARS Coronavirus 2: NEGATIVE

## 2022-02-27 ENCOUNTER — Ambulatory Visit: Payer: Medicaid Other | Admitting: Obstetrics and Gynecology

## 2022-02-28 ENCOUNTER — Ambulatory Visit: Payer: Medicaid Other | Admitting: Obstetrics and Gynecology

## 2022-03-07 ENCOUNTER — Ambulatory Visit (INDEPENDENT_AMBULATORY_CARE_PROVIDER_SITE_OTHER): Payer: No Typology Code available for payment source | Admitting: Obstetrics and Gynecology

## 2022-03-07 ENCOUNTER — Other Ambulatory Visit (HOSPITAL_COMMUNITY)
Admission: RE | Admit: 2022-03-07 | Discharge: 2022-03-07 | Disposition: A | Discharge status: Home / Self Care | Payer: No Typology Code available for payment source | Source: Ambulatory Visit | Attending: Obstetrics and Gynecology | Admitting: Obstetrics and Gynecology

## 2022-03-07 ENCOUNTER — Encounter: Payer: Self-pay | Admitting: Obstetrics and Gynecology

## 2022-03-07 VITALS — BP 118/85 | HR 83

## 2022-03-07 DIAGNOSIS — R350 Frequency of micturition: Secondary | ICD-10-CM | POA: Diagnosis present

## 2022-03-07 NOTE — Progress Notes (Deleted)
3 months of urinary frequnecy, voiding every 15-20 min, full bladder every time  Night time voids x2-3   Water 4 x16oiz botthes [place] lemonade or  No alcohol Ocasional caffeine (2-3 x a month)

## 2022-03-07 NOTE — Progress Notes (Signed)
NEW GYNECOLOGY PATIENT Patient name: Brittany Quinn MRN [id number]  Date of birth: [birth year] Chief Complaint:   Urinary Frequency     History:  Brittany Quinn is a 31 y.o. [id number] being seen today for urinary frequency.    Patient presents today with urinary frequency for about 3 months. She is voiding every 15-20 minutes with a full bladder. Not sure she is completely emptying her bladder. She voids 2-3x at night. She loses a small volume with sneeze/laugh/cough. States it is similar to the end of pregnancy when she had to void all the time. Denies pelvic pressure, increased abdominal bulge/bloating, constipation, diarrhea, vaginal discharge, hematuria, and vaginal itching. She drinks 4 16oz bottles of water daily, occasional lemonade/juice, no alcohol and rare caffeine (2-3x a month). Denies history of UTIs     Review of Systems  All other systems reviewed and are negative.     Gynecologic History Patient's last menstrual period was 02/25/2022 (exact date). Last Pap: 03/2020. Result was normal with negative HPV Last Mammogram: 05/2020.  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 Please review and update pregnancy details.  1 SAB             Past Medical History:  Diagnosis Date   Medical history non-contributory    Trichomonas     Past Surgical History:  Procedure Laterality Date   CESAREAN SECTION N/A 05/27/2019   Procedure: CESAREAN SECTION;  Surgeon: 07/25/2019, MD;  Location: [facility];  Service: Obstetrics;  Laterality: N/A;   LAPAROSCOPY  06/23/2011   Procedure: LAPAROSCOPY OPERATIVE;  Surgeon: 08/23/2011, MD;  Location:  [facility] ORS;  Service: Gynecology;  Laterality: Left;  Diagnostic laparoscopy   LAPAROTOMY  06/23/2011   Procedure: LAPAROTOMY;  Surgeon: 08/23/2011, MD;  Location: [facility] ORS;  Service: Gynecology;  Laterality: Left;  Wedge resection of Ruptured left cornual ectopic pregnancy with left salpingectomy    No current outpatient medications on file prior to visit.   No current facility-administered medications on file prior to visit.    No Known Allergies  Social History:  reports that she quit smoking about 3 years ago. Her smoking use included cigars. She has never used smokeless tobacco. She reports that she does not currently use alcohol. She reports current drug use. Drug: Marijuana.  Family History  Problem Relation Age of Onset   Breast cancer Mother        around 34   Cancer Father        prostate   Diabetes Father    Breast cancer Maternal Aunt    Breast cancer Cousin    Breast cancer Maternal Aunt     The following portions of the patient's history were reviewed and updated as appropriate: allergies, current medications, past family history, past medical history, past social history, past surgical history and problem list.  Review of Systems Pertinent items noted in HPI and remainder of comprehensive ROS otherwise negative.  Physical Exam:  BP 118/85  Pulse 83   LMP 02/25/2022 (Exact Date)  Physical Exam Vitals and nursing note reviewed.  Constitutional:      Appearance: Normal appearance.  Cardiovascular:     Rate and Rhythm: Normal rate.  Pulmonary:     Effort: Pulmonary effort is normal.     Breath sounds: Normal breath sounds.  Abdominal:     Palpations: Abdomen is soft. There is no mass.     Tenderness: There is no abdominal tenderness. There is no rebound.  Neurological:     General: No focal deficit present.     Mental Status: She is alert and oriented to person, place, and time.  Psychiatric:        Mood and Affect: Mood normal.        Behavior: Behavior  normal.        Thought Content: Thought content normal.        Judgment: Judgment normal.     Assessment and Plan:   1. Urinary frequency Isolated urinary frequency of unclear etiology. Obtain initial labs today and will follow up results. If negative workup, consider pelvic exam and/or referral to urogyn for OAB.  - Basic Metabolic Panel (BMET) - HgB A1c - Urine Culture - Cervicovaginal ancillary only   Routine preventative health maintenance measures emphasized. Please refer to After Visit Summary for other counseling recommendations.      Lorriane Shire, MD Obstetrician & Gynecologist, Faculty [facility] for [employer], [facility]

## 2022-03-08 LAB — BASIC METABOLIC PANEL
BUN/Creatinine Ratio: 12 (ref 9–23)
BUN: 8 mg/dL (ref 6–20)
CO2: 23 mmol/L (ref 20–29)
Calcium: 9.8 mg/dL (ref 8.7–10.2)
Chloride: 100 mmol/L (ref 96–106)
Creatinine, Ser: 0.66 mg/dL (ref 0.57–1.00)
Glucose: 95 mg/dL (ref 70–99)
Potassium: 4 mmol/L (ref 3.5–5.2)
Sodium: 140 mmol/L (ref 134–144)
eGFR: 119 mL/min/{1.73_m2} (ref >59)

## 2022-03-08 LAB — HEMOGLOBIN A1C
Est. average glucose Bld gHb Est-mCnc: 123 mg/dL
Hgb A1c MFr Bld: 5.9 % — ABNORMAL HIGH (ref 4.8–5.6)

## 2022-03-10 LAB — URINE CULTURE

## 2022-03-13 ENCOUNTER — Other Ambulatory Visit: Payer: Self-pay | Admitting: Obstetrics and Gynecology

## 2022-03-13 ENCOUNTER — Encounter: Payer: Self-pay | Admitting: Obstetrics and Gynecology

## 2022-03-13 ENCOUNTER — Other Ambulatory Visit: Payer: Self-pay

## 2022-03-13 DIAGNOSIS — B9689 Other specified bacterial agents as the cause of diseases classified elsewhere: Secondary | ICD-10-CM

## 2022-03-13 LAB — CERVICOVAGINAL ANCILLARY ONLY
Bacterial Vaginitis (gardnerella): POSITIVE — AB
Candida Glabrata: NEGATIVE
Candida Vaginitis: NEGATIVE
Comment: NEGATIVE
Comment: NEGATIVE
Comment: NEGATIVE

## 2022-03-13 MED ORDER — METRONIDAZOLE 500 MG PO TABS: 500.0000 mg | ORAL_TABLET | Freq: Two times a day (BID) | ORAL | 0 refills | Status: DC

## 2022-03-13 MED ORDER — METRONIDAZOLE 0.75 % VA GEL: 1.0000 | Freq: Every day | VAGINAL | 1 refills | Status: DC

## 2022-03-26 ENCOUNTER — Ambulatory Visit (INDEPENDENT_AMBULATORY_CARE_PROVIDER_SITE_OTHER): Payer: No Typology Code available for payment source

## 2022-03-26 DIAGNOSIS — N912 Amenorrhea, unspecified: Secondary | ICD-10-CM

## 2022-03-26 DIAGNOSIS — Z3200 Encounter for pregnancy test, result unknown: Secondary | ICD-10-CM

## 2022-03-26 DIAGNOSIS — Z3201 Encounter for pregnancy test, result positive: Secondary | ICD-10-CM

## 2022-03-26 LAB — POCT PREGNANCY, URINE: Preg Test, Ur: POSITIVE — AB

## 2022-03-26 NOTE — Progress Notes (Signed)
Possible Pregnancy  Here today for pregnancy confirmation. UPT in office today is positive. Pt reports first positive home UPT on 03/17/22. Pt states this is an unplanned, but desired pregnancy. Reviewed dating with patient:   LMP: 02/25/22 EDD: 12/02/22 4w 1d today  OB history reviewed; hx single c-section due to placenta previa at 37 weeks. Pt states she was recently told she is prediabetic; A1C on 03/13/22 was 5.9. Reviewed medications and allergies with patient. Recommended pt begin prenatal vitamin and schedule prenatal care. Message sent to front office to schedule new OB appts.  Marjo Bicker, RN 03/26/2022  1:44 PM

## 2022-04-30 IMAGING — MG DIGITAL DIAGNOSTIC BILAT W/ TOMO W/ CAD
6 of 12 series · 6 of 36 positions shown · non-contrast
Comparison: None.

CLINICAL DATA: Bilateral clear to milky breast discharge for the
past 7 months, after finishing breast feeding. The patient had right
mastitis with subsequent abnormal appearance of the nipple. She also
has a palpable mass in the right medial retroareolar/periareolar
region for the past 1-2 months, unchanged. Family history of breast
cancer including the patient's mother diagnosed at age 62, 2
maternal aunts and a maternal cousin.



[R MLO synth-2D]
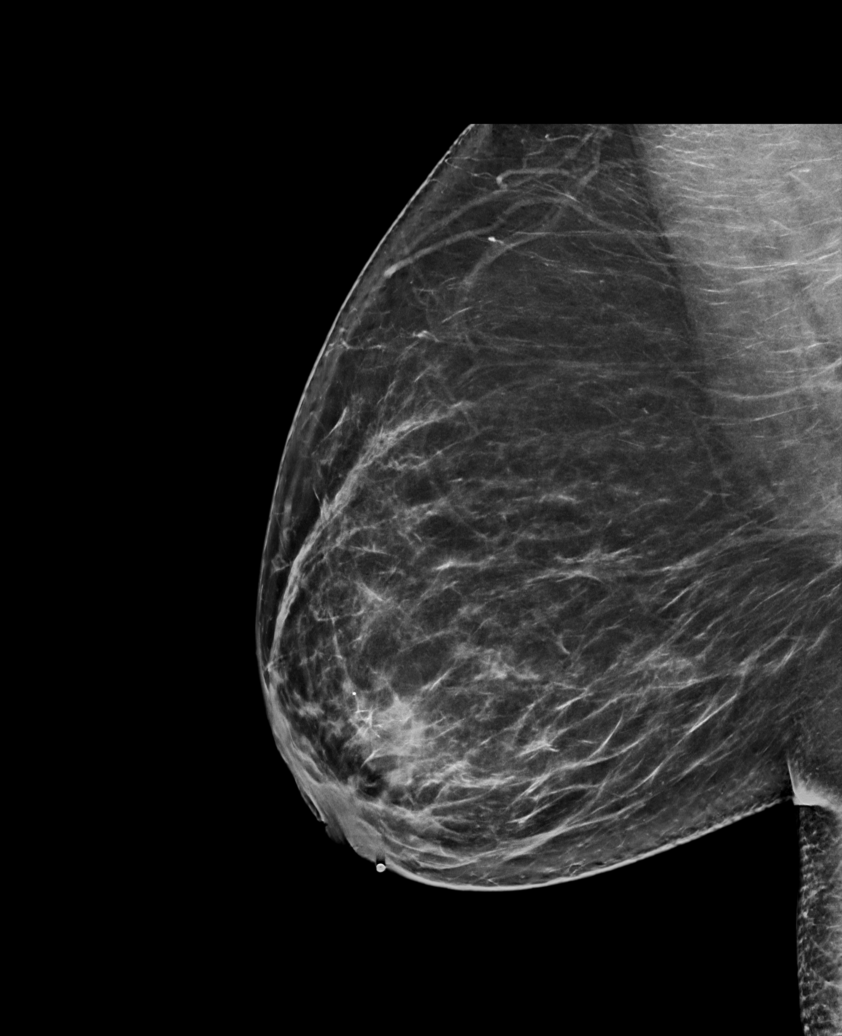

[L MLO synth-2D (1 of 2)]
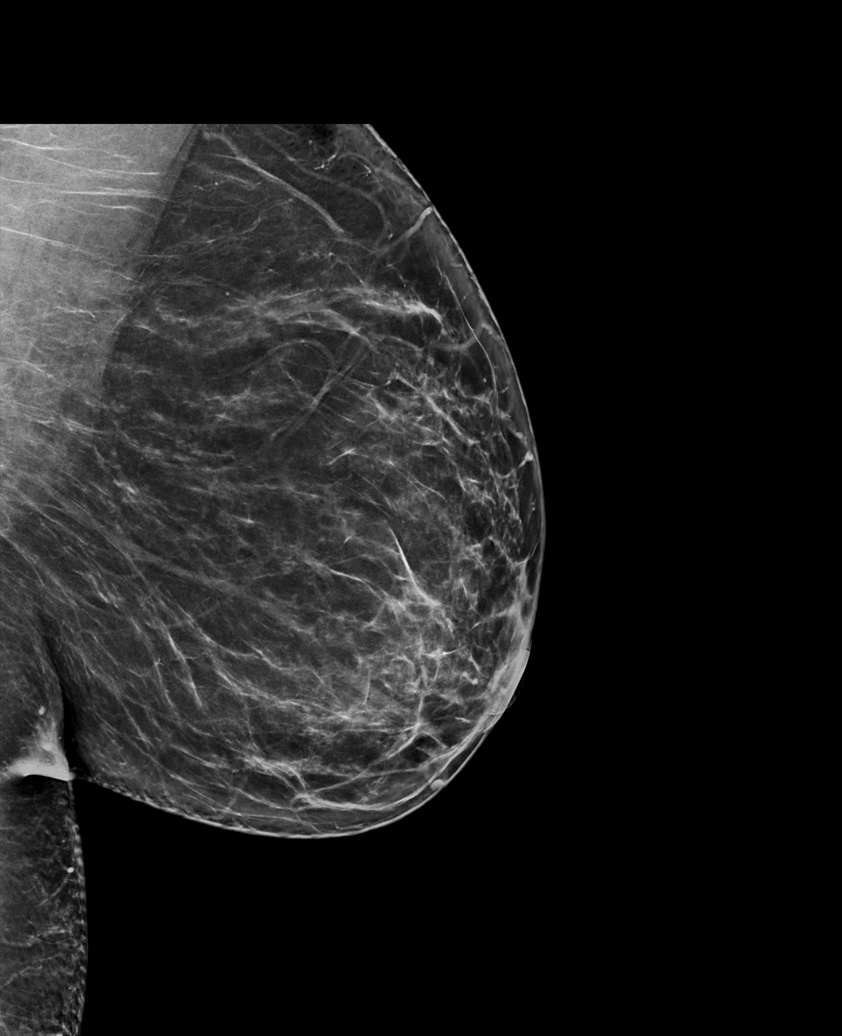

[L CC synth-2D]
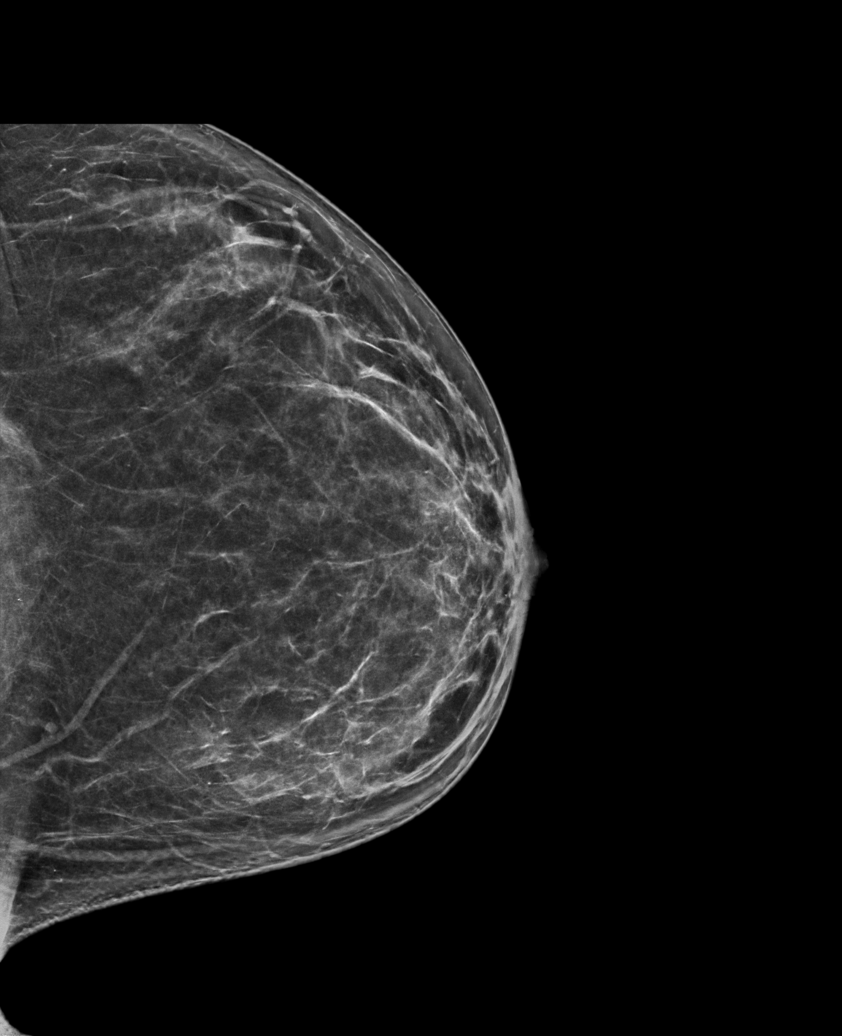

[R CC synth-2D]
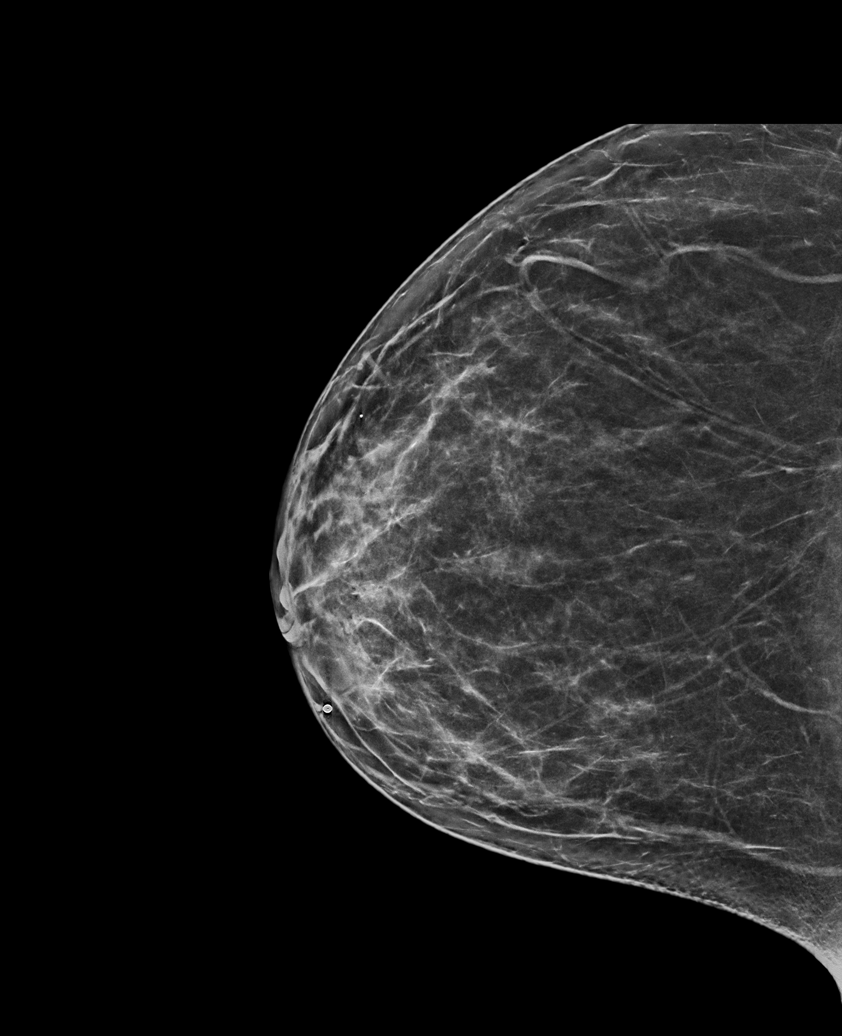

[L MLO synth-2D (2 of 2)]
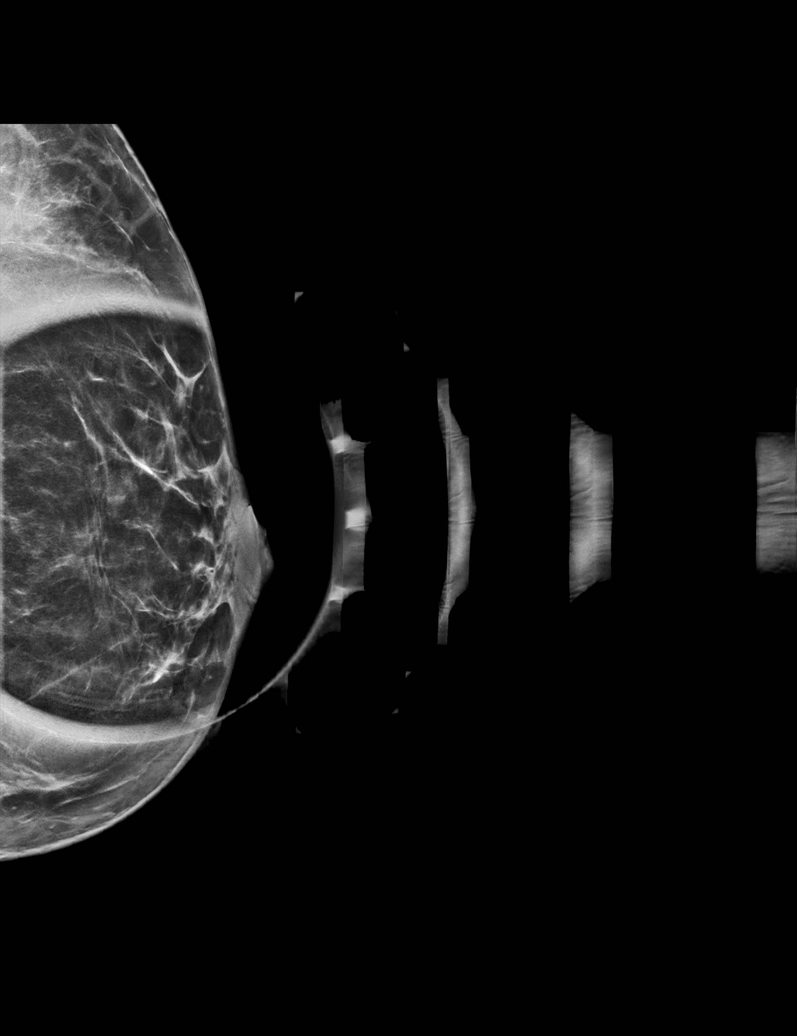

[R TAN synth-2D]
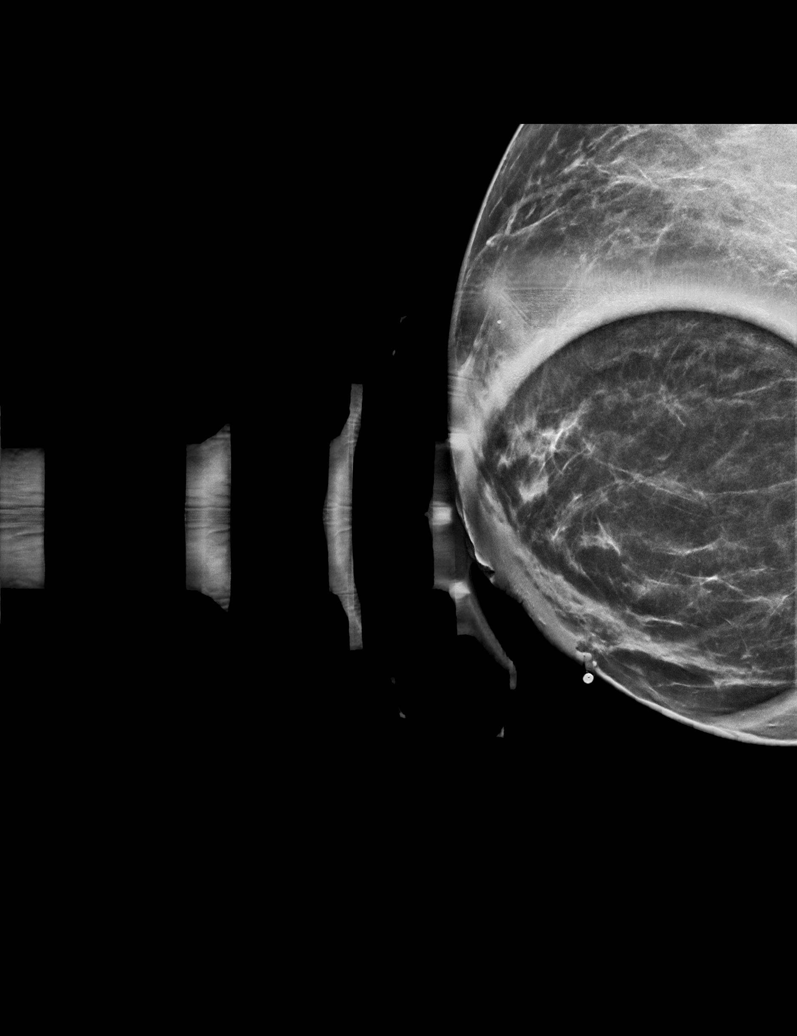

[6 of 36 positions shown; findings below may reference images not displayed]

ACR Breast Density Category b: There are scattered areas of
fibroglandular density.
FINDINGS: Partial nipple retraction on the right. Inferior areolar skin
thickening in the region of the palpable mass reported by the
patient, marked with a metallic marker. No radiographically visible
mass or other findings suspicious for malignancy in either breast.

On physical exam, the patient has an approximately 2 x 1 5 cm
horizontally oriented, oval, palpable mass in the 3 o'clock
retroareolar and periareolar right breast. There is no associated
skin redness, increased warmth or tenderness. She has a small
rounded area of denuded skin in the lateral right nipple which the
patient reports has been there since removing a nipple ring before
the mastitis began more than 7 months ago.

Targeted ultrasound is performed, showing a 2.8 x 2.6 x 0.9 cm
horizontally oriented, oval, hypoechoic mass with internal oval,
more hypoechoic areas in the 3 o'clock retroareolar and periareolar
right breast. The less hypoechoic components at prominent internal
blood flow with power Doppler with mild surrounding prominent
vascularity.

Ultrasound of the right axilla demonstrates a single right axillary
lymph node with diffuse cortical thickening measuring up to 5 mm in
thickness.

Ultrasound of the retroareolar and periareolar left breast
demonstrated no abnormalities.
IMPRESSION: 1. 2.8 cm probable low-grade, chronic abscess/phlegmon in the 3
o'clock retroareolar and periareolar right breast.
2. Probable reactive right axillary lymph node.
3. Physiological bilateral nipple discharge.

RECOMMENDATION:
1. The patient was given a prescription for 100 mg of doxycycline to
be taken twice a day for the next 10 days.
2. Follow-up right breast and right axillary ultrasound in 2 weeks.
This has been scheduled.

I have discussed the findings and recommendations with the patient.
If applicable, a reminder letter will be sent to the patient
regarding the next appointment.

BI-RADS CATEGORY  3: Probably benign.

## 2022-05-01 ENCOUNTER — Telehealth (INDEPENDENT_AMBULATORY_CARE_PROVIDER_SITE_OTHER): Payer: Medicaid Other

## 2022-05-01 ENCOUNTER — Encounter: Payer: Self-pay | Admitting: Certified Nurse Midwife

## 2022-05-01 DIAGNOSIS — Z3490 Encounter for supervision of normal pregnancy, unspecified, unspecified trimester: Secondary | ICD-10-CM | POA: Insufficient documentation

## 2022-05-01 DIAGNOSIS — Z3689 Encounter for other specified antenatal screening: Secondary | ICD-10-CM

## 2022-05-01 DIAGNOSIS — Z3480 Encounter for supervision of other normal pregnancy, unspecified trimester: Secondary | ICD-10-CM | POA: Insufficient documentation

## 2022-05-01 MED ORDER — BLOOD PRESSURE MONITORING DEVI: 1.0000 | 0 refills | Status: DC

## 2022-05-01 NOTE — Progress Notes (Addendum)
New OB Intake  I connected with Shawna Orleans  on 05/01/22 at  9:15 AM EST by MyChart Video Visit and verified that I am speaking with the correct person using two identifiers. Nurse is located at [facility] and pt is located at home.  I discussed the limitations, risks, security and privacy concerns of performing an evaluation and management service by telephone and the availability of in person appointments. I also discussed with the patient that there may be a patient responsible charge related to this service. The patient expressed understanding and agreed to proceed.  I explained I am completing New OB Intake today. We discussed EDD of 12/02/22 that is based on LMP of 02/25/22. Pt is G5/P1. I reviewed her allergies, medications, Medical/Surgical/OB history, and appropriate screenings. I informed her of [facility] services. [facility] information placed in AVS. Based on history, this is a low risk pregnancy.  Patient Active Problem List   Diagnosis Date Noted   Obesity 03/22/2020   Status post primary low transverse cesarean section 05/27/2019   Former smoker 12/17/2018    Concerns addressed today  Delivery Plans Plans to deliver at [facility]. Patient given information for [facility] Healthy Baby website for more information about [facility] and [place]. Patient is not interested in water birth. Offered upcoming OB visit with CNM to discuss further.  MyChart/Babyscripts MyChart access verified. I explained pt will have some visits in office and some virtually. Babyscripts instructions given and order placed. Patient verifies receipt of registration text/e-mail. Account successfully created and app downloaded.  Blood Pressure Cuff/Weight Scale Blood pressure cuff ordered for patient to pick-up from [employer]. Explained after first prenatal appt pt will check weekly and document in 4. Patient does not have weight scale; order sent to [place], patient may track weight weekly in  Babyscripts.  Anatomy US Explained first scheduled [place] will be around 19 weeks. Anatomy US scheduled for 07/11/22 at 0945a. Pt notified to arrive at 0930a.  Labs Discussed Johnsie Cancel genetic screening with patient. Would like both Panorama and Horizon drawn at new OB visit. Routine prenatal labs needed.  COVID Vaccine Patient has had COVID vaccine.   Is patient a CenteringPregnancy candidate?  Accepted Declined due to  NA Not a candidate due to  NA If accepted,    Is patient a Mom+Baby Combined Care candidate?  Not a candidate   If accepted, Mom+Baby staff notified  Social Determinants of Health Food Insecurity: Patient denies food insecurity. WIC Referral: Patient is interested in referral to [facility].  Transportation: Patient denies transportation needs. Childcare: Discussed no children allowed at ultrasound appointments. Offered childcare services; patient declines childcare services at this time.  First visit review I reviewed new OB appt with patient. I explained they will have a provider visit that includes . Explained pt will be seen by Gaylan Gerold, CNM at first visit; encounter routed to appropriate provider. Explained that patient will be seen by pregnancy navigator following visit with provider.   Bethanne Ginger, [place] 05/01/2022  9:40 AM

## 2022-05-08 ENCOUNTER — Encounter: Payer: Self-pay | Admitting: General Practice

## 2022-05-08 DIAGNOSIS — Z3480 Encounter for supervision of other normal pregnancy, unspecified trimester: Secondary | ICD-10-CM

## 2022-05-08 MED ORDER — BLOOD PRESSURE MONITORING DEVI
1.0000 | 0 refills | Status: DC
Start: 2022-05-08 — End: 2022-05-17

## 2022-05-09 ENCOUNTER — Encounter: Payer: Self-pay | Admitting: Certified Nurse Midwife

## 2022-05-16 ENCOUNTER — Telehealth: Payer: Self-pay

## 2022-05-16 ENCOUNTER — Other Ambulatory Visit: Payer: Self-pay

## 2022-05-16 ENCOUNTER — Ambulatory Visit (INDEPENDENT_AMBULATORY_CARE_PROVIDER_SITE_OTHER): Payer: No Typology Code available for payment source | Admitting: Certified Nurse Midwife

## 2022-05-16 ENCOUNTER — Encounter (HOSPITAL_COMMUNITY): Payer: Self-pay | Admitting: Obstetrics and Gynecology

## 2022-05-16 VITALS — BP 125/72 | HR 74 | Wt 222.0 lb

## 2022-05-16 DIAGNOSIS — R7309 Other abnormal glucose: Secondary | ICD-10-CM | POA: Diagnosis not present

## 2022-05-16 DIAGNOSIS — Z98891 History of uterine scar from previous surgery: Secondary | ICD-10-CM | POA: Diagnosis not present

## 2022-05-16 DIAGNOSIS — O0289 Other abnormal products of conception: Secondary | ICD-10-CM | POA: Diagnosis not present

## 2022-05-16 DIAGNOSIS — Z3A11 11 weeks gestation of pregnancy: Secondary | ICD-10-CM | POA: Diagnosis not present

## 2022-05-16 DIAGNOSIS — Z3491 Encounter for supervision of normal pregnancy, unspecified, first trimester: Secondary | ICD-10-CM

## 2022-05-16 NOTE — Telephone Encounter (Signed)
Call and spoke with patient, surgery date, time, location and preop instructions given. Patient expressed understanding. 

## 2022-05-16 NOTE — Progress Notes (Signed)
Bedside ultrasound performed to assess FHR. FHR not visualized, no fetal movement seen. CRL [redacted]w[redacted]d. Gaylan Gerold CNM present during ultrasound

## 2022-05-16 NOTE — Progress Notes (Signed)
History:  Ms. Leora Platt is a 32 y.o. [phone] who presents to clinic today for her new OB visit. She is experiencing improving nausea/vomiting but is otherwise doing well and excited about the pregnancy. FOB accompanying and also excited to be here as their last child was born during Covid restrictions.   The following portions of the patient's history were reviewed and updated as appropriate: allergies, current medications, family history, past medical history, social history, past surgical history and problem list.  Review of Systems:  Pertinent items noted in HPI and remainder of comprehensive ROS otherwise negative.   Objective:  Physical Exam BP 125/72   Pulse 74   Wt 222 lb (100.7 kg)   LMP 02/25/2022 (Exact Date)   BMI 39.33 kg/m  Physical Exam Vitals and nursing note reviewed.  Constitutional:      Appearance: Normal appearance.  HENT:     Head: Normocephalic and atraumatic.  Eyes:     Pupils: Pupils are equal, round, and reactive to light.  Cardiovascular:     Rate and Rhythm: Normal rate and regular rhythm.  Pulmonary:     Effort: Pulmonary effort is normal.  Abdominal:     Palpations: Abdomen is soft.     Tenderness: There is no abdominal tenderness.  Genitourinary:    Comments: Pap up to date, no vaginal discharge/bleeding. Pelvic exam deferred. Musculoskeletal:        General: Normal range of motion.  Skin:    General: Skin is warm.     Capillary Refill: Capillary refill takes less than 2 seconds.  Neurological:     Mental Status: She is alert and oriented to person, place, and time.  Psychiatric:        Mood and Affect: Mood normal.        Behavior: Behavior normal.        Thought Content: Thought content normal.        Judgment: Judgment normal.    Labs and Imaging No results found for this or any previous visit (from the past 24 hour(s)).  No results found.   Assessment & Plan:  1. Encounter for supervision of low-risk pregnancy in first  trimester - Doing well overall, no cramping or vaginal bleeding  2. [redacted] weeks gestation of pregnancy - Routine new OB labs ordered but then canceled  3. Status post primary low transverse cesarean section - Was planning TOLAC  4. Elevated hemoglobin A1c - Counseled about need to do early 2hr GTT (last A1C <56mo ago) - Also reviewed eating habits that will help balance her blood sugar  5. Non-viable pregnancy - CMA unable to obtain FHT. I was unable to hear with Doppler or see with bedside U/S. Derinda Late, RN took pt to ultrasound room and saw baby measuring [redacted]w[redacted]d with no fetal heart rate. - Pts informed and given time to process info and grieve. - Discussed options and pt very strongly desires a D&E as she does not want to have to go through bleeding and passage of the fetus at home - [employer] out through side entrance, message sent to [place] for scheduling.  Follow up two weeks after D&E, pt requested to stay on my schedule.  Gabriel Carina, CNM 05/16/2022 10:13 AM

## 2022-05-16 NOTE — H&P (Signed)
Brittany Quinn is an 32 y.o. female [phone] with MAB at 10 5/7 weeks by U/S. Desires surgerical management  Blood type B positive   Menstrual History: Menarche age: 38 Patient's last menstrual period was 02/25/2022 (exact date).    Past Medical History:  Diagnosis Date   Medical history non-contributory    Trichomonas     Past Surgical History:  Procedure Laterality Date   CESAREAN SECTION N/A 05/27/2019   Procedure: CESAREAN SECTION;  Surgeon: Gwynne Edinger, MD;  Location: [facility] LD ORS;  Service: Obstetrics;  Laterality: N/A;   LAPAROSCOPY  06/23/2011   Procedure: LAPAROSCOPY OPERATIVE;  Surgeon: Osborne Oman, MD;  Location: [place] ORS;  Service: Gynecology;  Laterality: Left;  Diagnostic laparoscopy   LAPAROTOMY  06/23/2011   Procedure: LAPAROTOMY;  Surgeon: Osborne Oman, MD;  Location: [place] ORS;  Service: Gynecology;  Laterality: Left;  Wedge resection of Ruptured left cornual ectopic pregnancy with left salpingectomy    Family History  Problem Relation Age of Onset   Breast cancer Mother        around 66   Cancer Father        prostate   Diabetes Father    Breast cancer Maternal Aunt    Breast cancer Cousin    Breast cancer Maternal Aunt     Social History:  reports that she quit smoking about 3 years ago. Her smoking use included cigars. She has never used smokeless tobacco. She reports that she does not currently use alcohol. She reports current drug use. Drug: Marijuana.  Allergies: No Known Allergies  No medications prior to admission.    Review of Systems  Constitutional: Negative.   Respiratory: Negative.    Cardiovascular: Negative.   Gastrointestinal: Negative.   Genitourinary: Negative.     Last menstrual period 02/25/2022. Physical Exam Cardiovascular:     Rate and Rhythm: Normal rate and regular rhythm.  Pulmonary:     Effort: Pulmonary effort is normal.     Breath sounds: Normal breath sounds.  Abdominal:     General: Bowel sounds are normal.      Palpations: Abdomen is soft.  Genitourinary:    Comments: Deferred to OR Neurological:     Mental Status: She is alert.     No results found for this or any previous visit (from the past 24 hour(s)).  No results found.  Assessment/Plan: MAB  Pt for suction D & C. R/B/Post op care reviewed with pt.  Pt desires to proceed.  [name] 05/16/2022, 2:20 PM

## 2022-05-16 NOTE — Progress Notes (Signed)
PCP - denies Cardiologist - denies  PPM/ICD - denies  CPAP - n/a  Fasting Blood Sugar - n/a  Blood Thinner Instructions: n/a Patient was instructed: As of today, STOP taking any Aspirin (unless otherwise instructed by your surgeon) Aleve, Naproxen, Ibuprofen, Motrin, Advil, Goody's, BC's, all herbal medications, fish oil, and all vitamins.  ERAS Protcol - yes, until 07:00 o'clock  COVID TEST- n/a  Anesthesia review: no  Patient verbally denies any shortness of breath, fever, cough and chest pain during phone call   -------------  SDW INSTRUCTIONS given:  Your procedure is scheduled on Thursday, February 1st, 2024.  Report to [facility] Main Entrance "A" at 07:30 A.M., and check in at the Admitting office.  Call this number if you have problems the morning of surgery:  [phone]   Remember:  Do not eat after midnight the night before your surgery  You may drink clear liquids until 07:00 the morning of your surgery.   Clear liquids allowed are: Water, Non-Citrus Juices (without pulp), Carbonated Beverages, Clear Tea, Black Coffee Only, and Gatorade    Take these medicines the morning of surgery with A SIP OF WATER - NONE   The day of surgery:                     Do not wear jewelry, make up, or nail polish            Do not wear lotions, powders, perfumes, or deodorant.            Do not shave 48 hours prior to surgery.              Do not bring valuables to the hospital.            [facility] is not responsible for any belongings or valuables.  Do NOT Smoke (Tobacco/Vaping) 24 hours prior to your procedure If you use a CPAP at night, you may bring all equipment for your overnight stay.   Contacts, glasses, dentures or bridgework may not be worn into surgery.      For patients admitted to the hospital, discharge time will be determined by your treatment team.   Patients discharged the day of surgery will not be allowed to drive home, and someone needs to stay with  them for 24 hours.    Special instructions:   [place]- Preparing For Surgery  Before surgery, you can play an important role. Because skin is not sterile, your skin needs to be as free of germs as possible. You can reduce the number of germs on your skin by washing with CHG (chlorahexidine gluconate) Soap before surgery.  CHG is an antiseptic cleaner which kills germs and bonds with the skin to continue killing germs even after washing.    Oral Hygiene is also important to reduce your risk of infection.  Remember - BRUSH YOUR TEETH THE MORNING OF SURGERY WITH YOUR REGULAR TOOTHPASTE  Please do not use if you have an allergy to CHG or antibacterial soaps. If your skin becomes reddened/irritated stop using the CHG.  Do not shave (including legs and underarms) for at least 48 hours prior to first CHG shower. It is OK to shave your face.  Please follow these instructions carefully.   Shower the NIGHT BEFORE SURGERY and the MORNING OF SURGERY with DIAL Soap.   Pat yourself dry with a CLEAN TOWEL.  Wear CLEAN PAJAMAS to bed the night before surgery  Place CLEAN SHEETS on  your bed the night of your first shower and DO NOT SLEEP WITH PETS.   Day of Surgery: Please shower morning of surgery  Wear Clean/Comfortable clothing the morning of surgery Do not apply any deodorants/lotions.   Remember to brush your teeth WITH YOUR REGULAR TOOTHPASTE.   Questions were answered. Patient verbalized understanding of instructions.

## 2022-05-17 ENCOUNTER — Ambulatory Visit (HOSPITAL_COMMUNITY)
Admission: RE | Admit: 2022-05-17 | Discharge: 2022-05-17 | Disposition: A | Discharge status: Home / Self Care | Payer: No Typology Code available for payment source | Attending: Obstetrics and Gynecology | Admitting: Obstetrics and Gynecology

## 2022-05-17 ENCOUNTER — Encounter (HOSPITAL_COMMUNITY): Payer: Self-pay | Admitting: Obstetrics and Gynecology

## 2022-05-17 ENCOUNTER — Encounter (HOSPITAL_COMMUNITY)
Admission: RE | Disposition: A | Discharge status: Home / Self Care | Payer: Self-pay | Source: Home / Self Care | Attending: Obstetrics and Gynecology

## 2022-05-17 ENCOUNTER — Other Ambulatory Visit: Payer: Self-pay

## 2022-05-17 ENCOUNTER — Ambulatory Visit (HOSPITAL_BASED_OUTPATIENT_CLINIC_OR_DEPARTMENT_OTHER): Payer: No Typology Code available for payment source | Admitting: Certified Registered Nurse Anesthetist

## 2022-05-17 ENCOUNTER — Ambulatory Visit (HOSPITAL_COMMUNITY): Payer: No Typology Code available for payment source | Admitting: Certified Registered Nurse Anesthetist

## 2022-05-17 DIAGNOSIS — O021 Missed abortion: Secondary | ICD-10-CM | POA: Insufficient documentation

## 2022-05-17 DIAGNOSIS — Z87891 Personal history of nicotine dependence: Secondary | ICD-10-CM | POA: Diagnosis not present

## 2022-05-17 DIAGNOSIS — Z3A10 10 weeks gestation of pregnancy: Secondary | ICD-10-CM | POA: Insufficient documentation

## 2022-05-17 DIAGNOSIS — Z3A13 13 weeks gestation of pregnancy: Secondary | ICD-10-CM | POA: Diagnosis not present

## 2022-05-17 DIAGNOSIS — Z3A00 Weeks of gestation of pregnancy not specified: Secondary | ICD-10-CM

## 2022-05-17 HISTORY — PX: DILATION AND EVACUATION: SHX1459

## 2022-05-17 HISTORY — DX: Prediabetes: R73.03

## 2022-05-17 LAB — CBC
HCT: 43.6 % (ref 36.0–46.0)
Hemoglobin: 15 g/dL (ref 12.0–15.0)
MCH: 32.1 pg (ref 26.0–34.0)
MCHC: 34.4 g/dL (ref 30.0–36.0)
MCV: 93.2 fL (ref 80.0–100.0)
Platelets: 237 10*3/uL (ref 150–400)
RBC: 4.68 MIL/uL (ref 3.87–5.11)
RDW: 12.7 % (ref 11.5–15.5)
WBC: 6.6 10*3/uL (ref 4.0–10.5)
nRBC: 0 % (ref 0.0–0.2)

## 2022-05-17 LAB — TYPE AND SCREEN
ABO/RH(D): B POS
Antibody Screen: NEGATIVE

## 2022-05-17 IMAGING — US US  BREAST BX W/ LOC DEV 1ST LESION IMG BX SPEC US GUIDE*R*
1 series · 10 of 10 positions shown · non-contrast
Comparison: Previous exam(s).
COMPARISON: Previous exam(s).

Addendum:
CLINICAL DATA: 30-year-old female presenting for ultrasound-guided
biopsy of the retroareolar right breast at 3 o'clock and 4 a right
axillary lymph node.

EXAM:
ULTRASOUND GUIDED RIGHT BREAST CORE NEEDLE BIOPSY

[Series 1: us breast bx w/ loc dev 1st lesion img bx spec us  · 0.07mm/px · 10 of 10 slices shown]
[im 1/10]
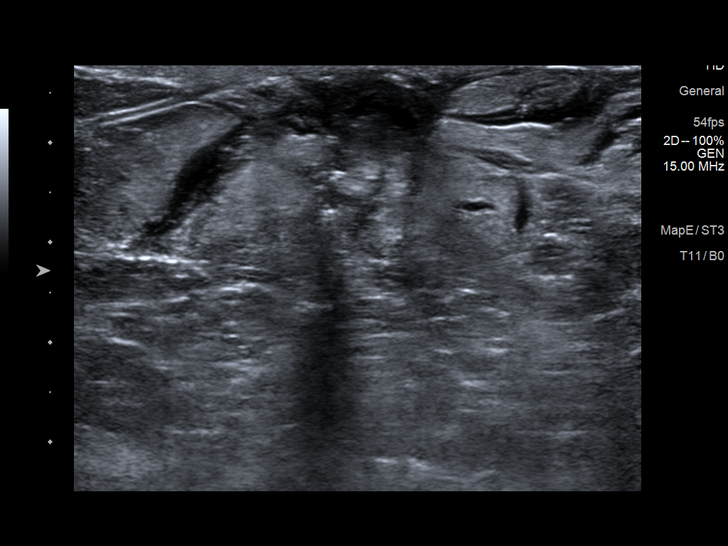
[im 2/10]
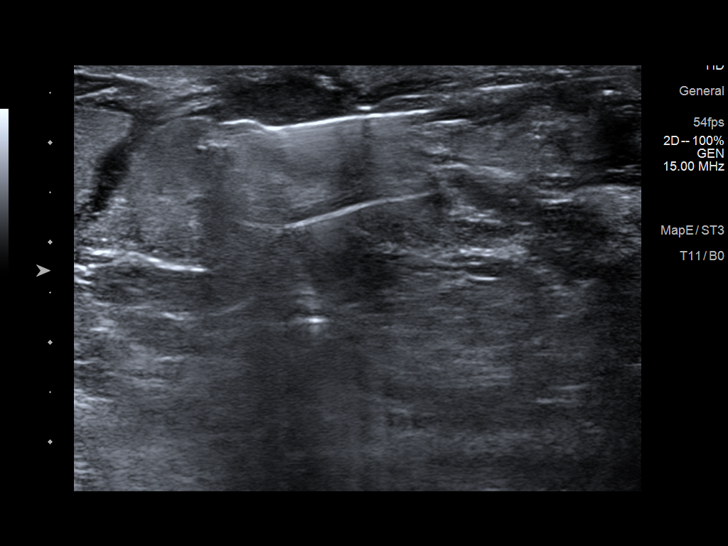
[im 3/10]
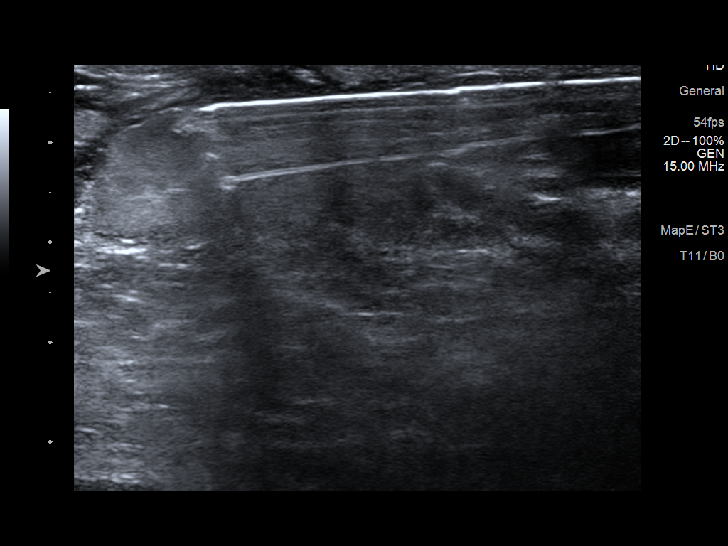
[im 4/10]
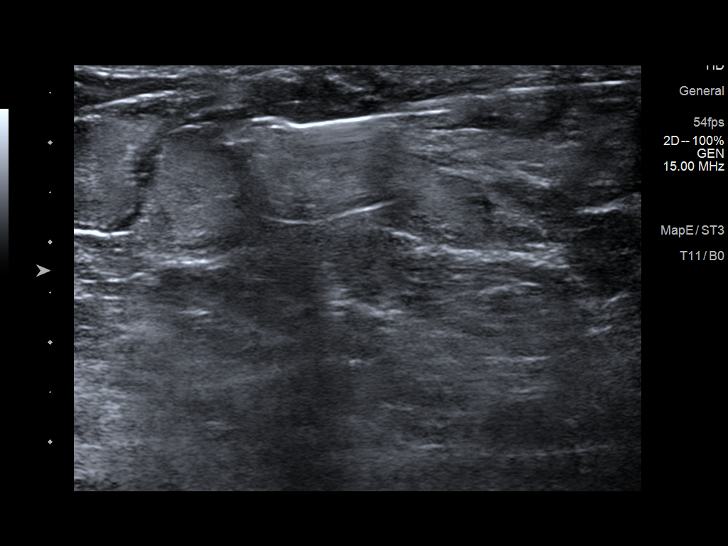
[im 5/10]
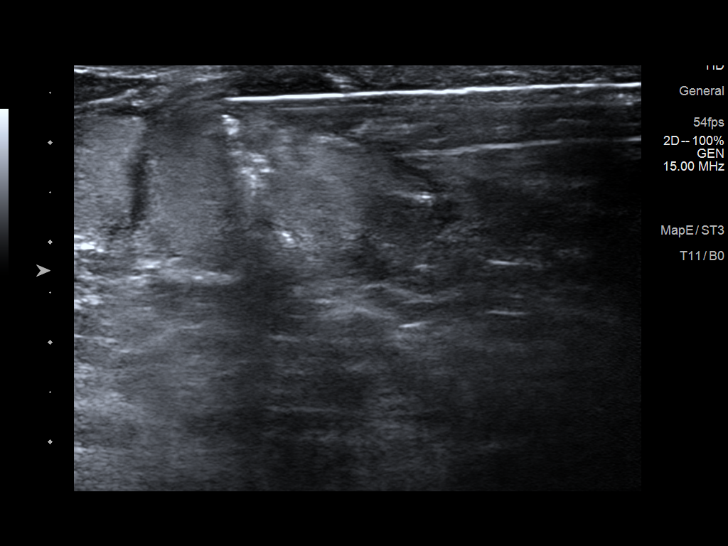
[im 6/10]
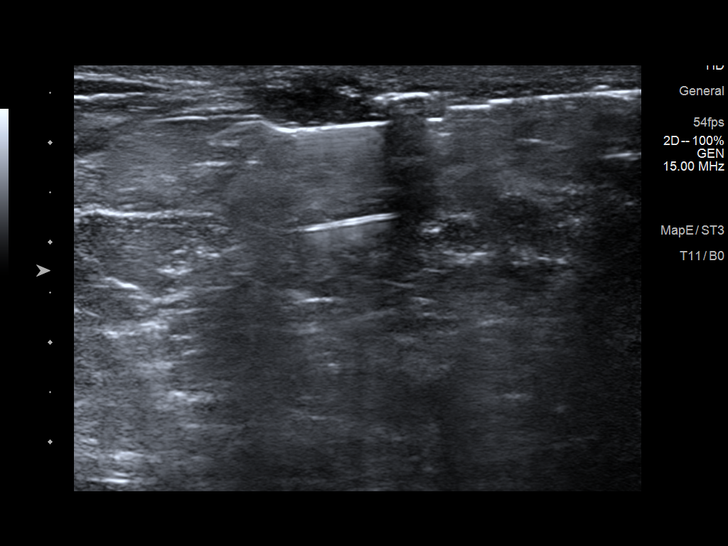
[im 7/10]
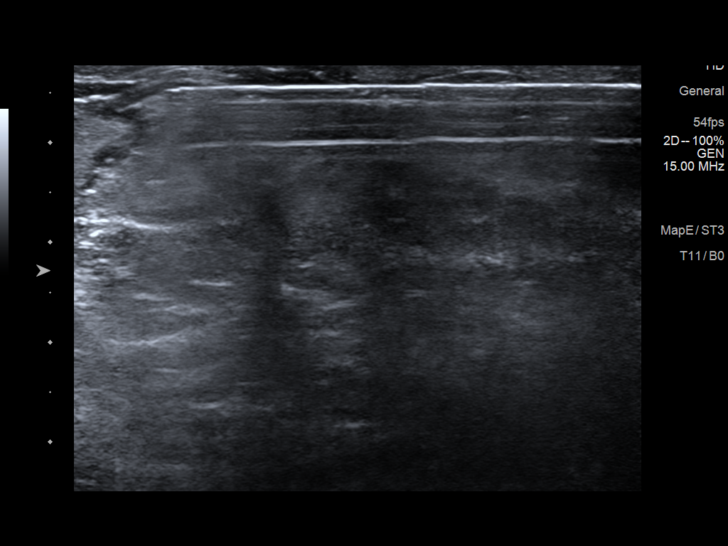
[im 8/10]
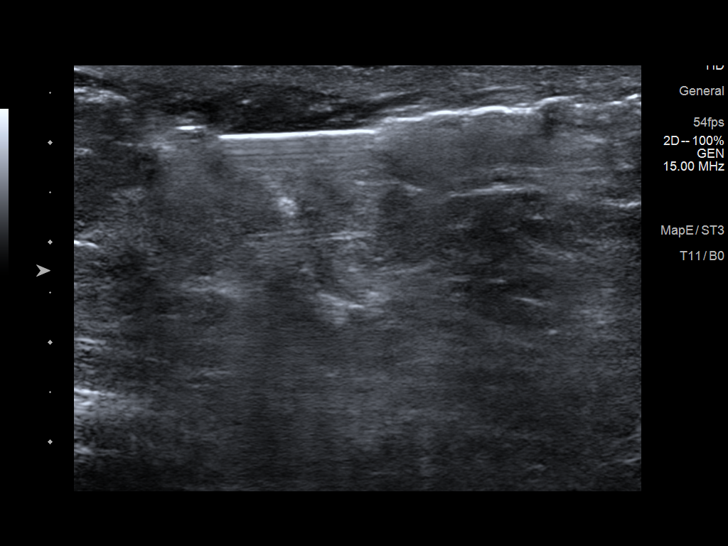
[im 9/10]
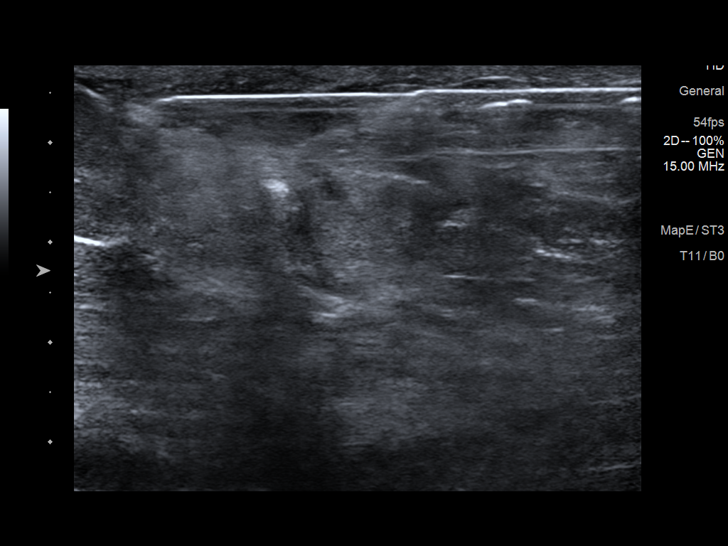
[im 10/10]
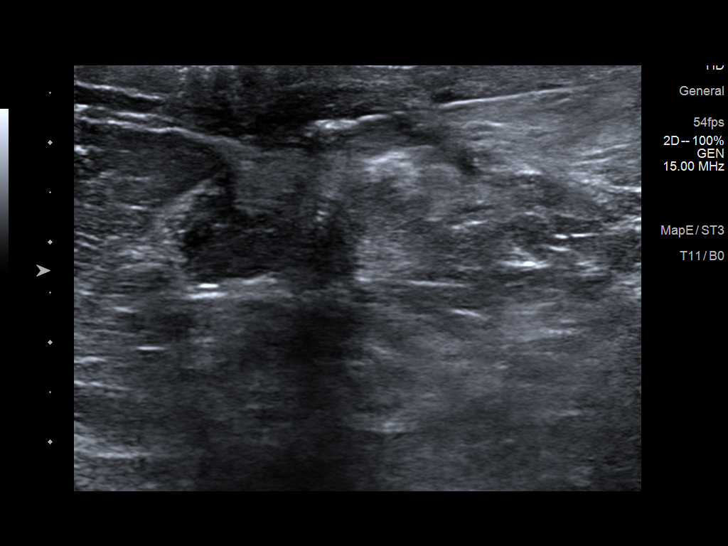

[10 of 10 positions shown; findings below may reference images not displayed]



#1 Lesion quadrant: Upper inner quadrant

Using sterile technique and 1% Lidocaine as local anesthetic, under
direct ultrasound visualization, a 14 gauge Lakew device was
used to perform biopsy of a retroareolar right breast mass at 3
o'clock using an inferior approach. At the conclusion of the
procedure a coil shaped tissue marker clip was deployed into the
biopsy cavity.

--------------------------------------------------------------------------------------------------------------------------------------------

#2 Lesion quadrant: Right axilla

Using sterile technique and 1% Lidocaine as local anesthetic, under
direct ultrasound visualization, a 14 gauge Lakew device was
used to perform biopsy of a right axillary lymph node using an
inferior approach. At the conclusion of the procedure a tribell
tissue marker clip was deployed into the biopsy cavity.

Follow up 2 view mammogram was performed and dictated separately.
IMPRESSION: 1. Ultrasound guided biopsy of a retroareolar right breast mass at 3
o'clock. No apparent complications.

2. Ultrasound guided biopsy of a right axillary lymph node. No
apparent complications.

ADDENDUM:
Pathology revealed INFLAMMATION WITH GRANULATION TISSUE AND
FIBROSIS, NO EVIDENCE OF MALIGNANCY of the RIGHT breast,
retroareolar mass 3 o'clock. The differential includes resolving
abscess. This was found to be concordant by Dr. Ali Dagane Ibsen.

Pathology revealed REACTIVE LYMPH NODE, NO EVIDENCE OF METASTATIC
CARCINOMA of the RIGHT axillary lymph node. This was found to be
concordant by Dr. Ali Dagane Ibsen.

Pathology results were discussed with the patient by telephone. The
patient reported doing well after the biopsies with tenderness at
the sites. Post biopsy instructions and care were reviewed and
questions were answered. The patient was encouraged to call The

Recommend clinical follow-up. Genetics counseling recommended, if
not already performed to determine lifetime risk of breast cancer
and timeline to start screening with mammography and possible breast
MRI. Due to her family history, this patient may need to start
before age 40.

Pathology results reported by Davidz Pat RN on 06/21/2020.



#1 Lesion quadrant: Upper inner quadrant

Using sterile technique and 1% Lidocaine as local anesthetic, under
direct ultrasound visualization, a 14 gauge Lakew device was
used to perform biopsy of a retroareolar right breast mass at 3
o'clock using an inferior approach. At the conclusion of the
procedure a coil shaped tissue marker clip was deployed into the
biopsy cavity.

--------------------------------------------------------------------------------------------------------------------------------------------

#2 Lesion quadrant: Right axilla

Using sterile technique and 1% Lidocaine as local anesthetic, under
direct ultrasound visualization, a 14 gauge Lakew device was
used to perform biopsy of a right axillary lymph node using an
inferior approach. At the conclusion of the procedure a tribell
tissue marker clip was deployed into the biopsy cavity.

Follow up 2 view mammogram was performed and dictated separately.
IMPRESSION: 1. Ultrasound guided biopsy of a retroareolar right breast mass at 3
o'clock. No apparent complications.

2. Ultrasound guided biopsy of a right axillary lymph node. No
apparent complications.

## 2022-05-17 IMAGING — US US BREAST*R* LIMITED INC AXILLA
1 series · 8 of 8 positions shown · non-contrast
Comparison: Previous exam(s).

CLINICAL DATA: 30-year-old female presenting for follow-up for a
possible chronic retroareolar right breast abscess and an enlarged
right axillary lymph node.

EXAM:
ULTRASOUND OF THE RIGHT BREAST

[Series 1: us breast*right* limited inc axilla · 0.06mm/px · 8 of 8 slices shown]
[im 1/8]
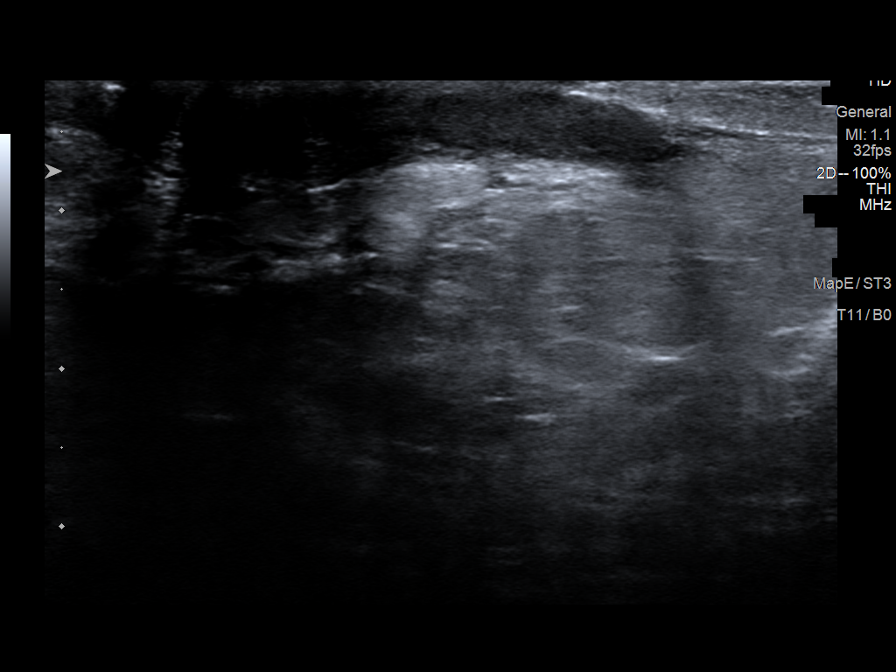
[im 2/8]
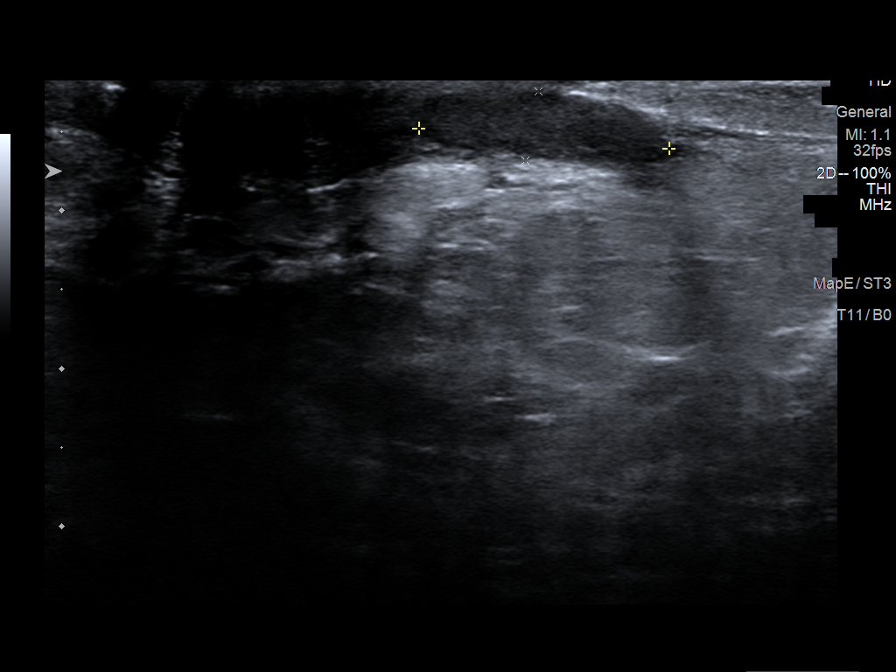
[im 3/8]
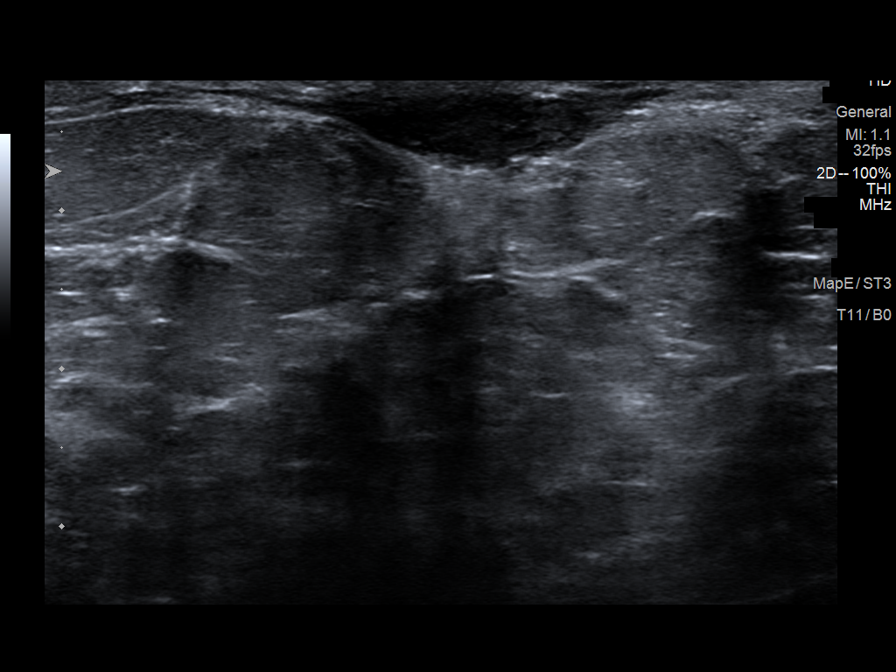
[im 4/8]
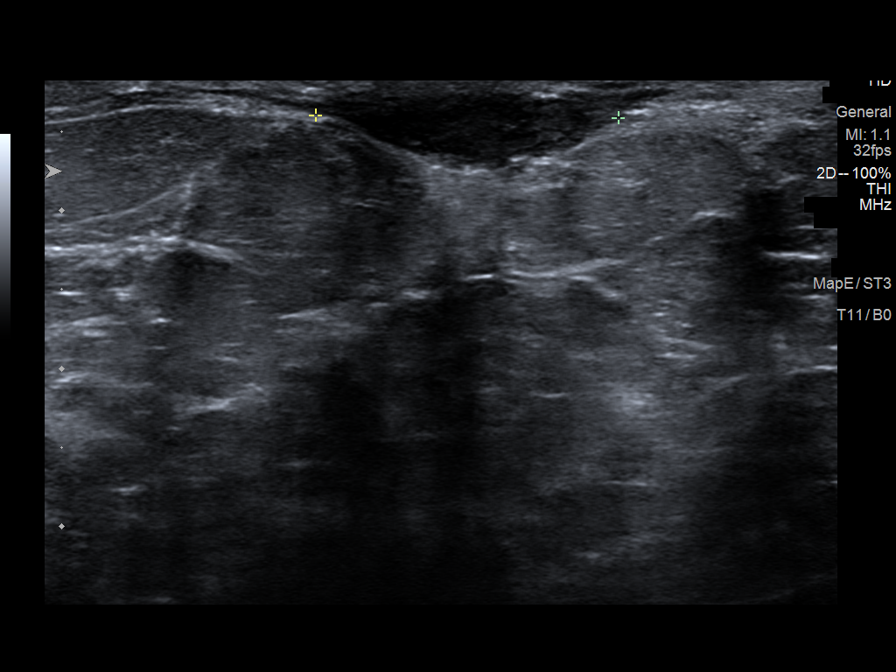
[im 5/8]
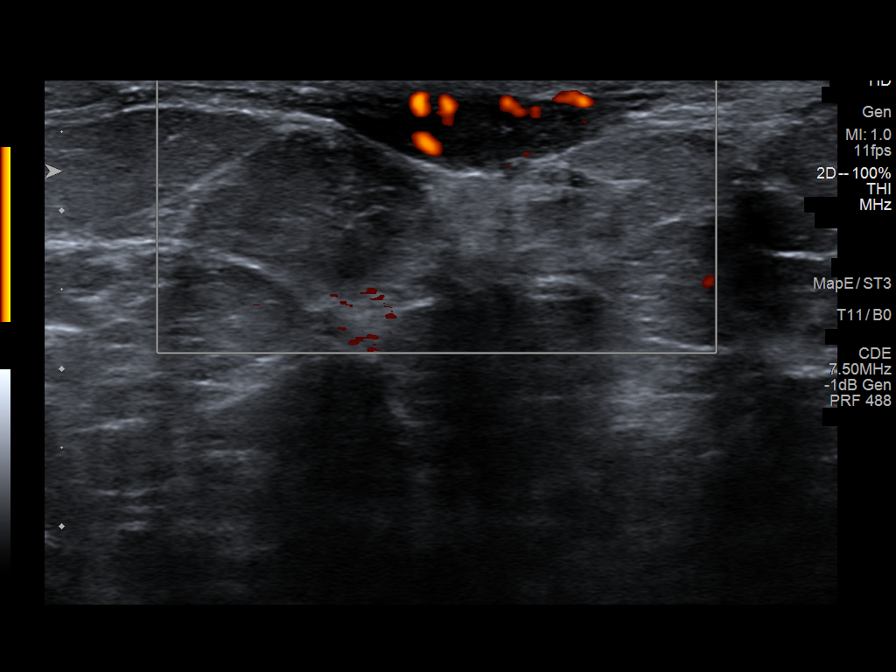
[im 6/8]
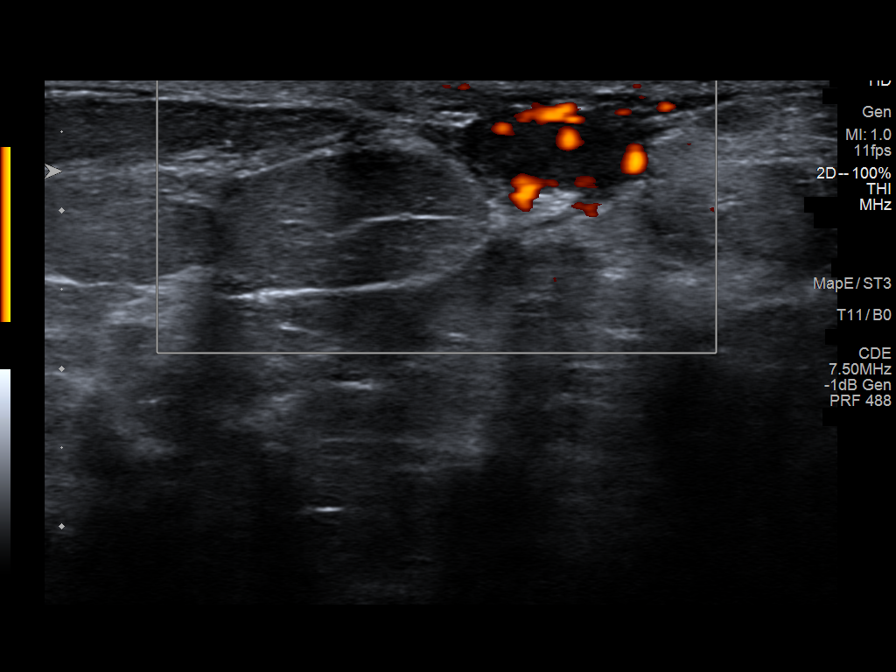
[im 7/8]
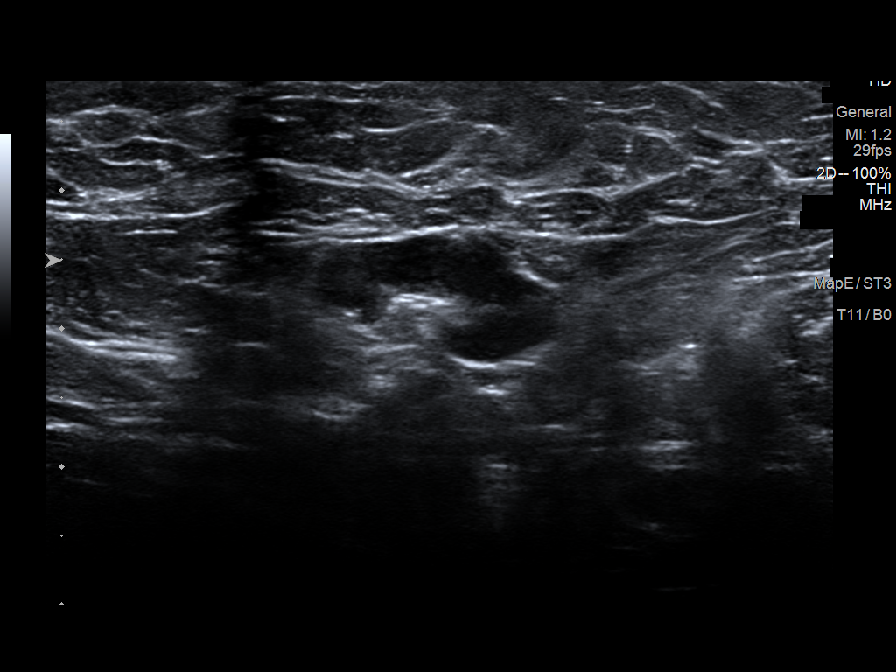
[im 8/8]
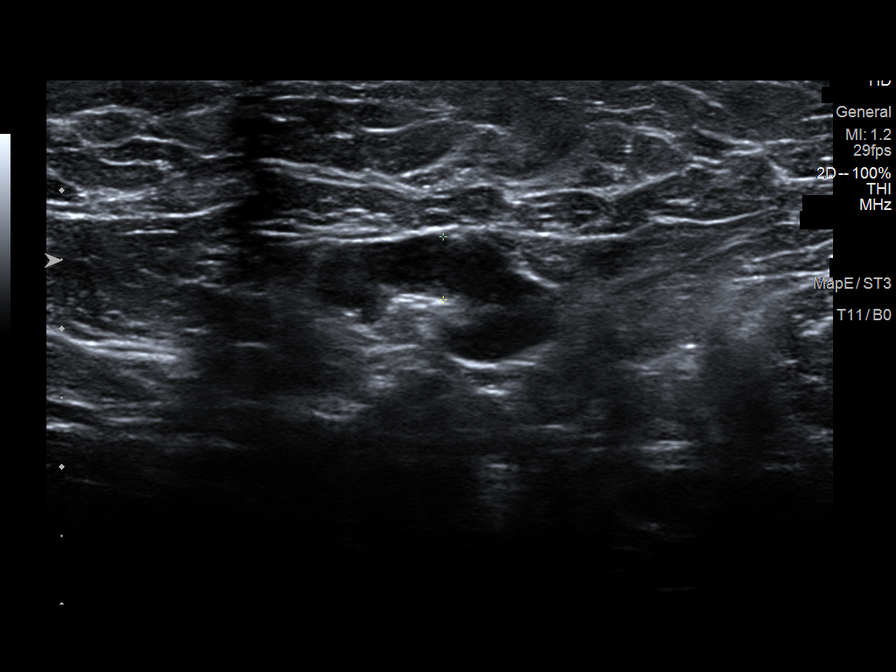

[8 of 8 positions shown; findings below may reference images not displayed]

FINDINGS: Ultrasound of the retroareolar right breast at 3 o'clock
demonstrates a persistent hypoechoic fluid collection with mobile
internal debris, as well as internal blood flow. This measures 1.9 x
0.5 x 1.6 cm, previously measuring 2.8 x 0.9 x 2.6 cm.

Ultrasound targeted to the right axilla demonstrates that the
previous seen enlarged right axillary lymph node has decreased in
size, but is still over normal limits in size measuring 5 mm rather
than 6 mm of cortical thickness.
IMPRESSION: 1.  There is a residual retroareolar right breast mass at 3 o'clock.

2. The right axillary lymph node has mildly decreased in size,
however the cortical thickness is still not within normal limits.

RECOMMENDATION:
Ultrasound guided biopsy is recommended for the right breast mass at
3 o'clock and for the right axillary lymph node. This procedure will
be performed today, and dictated in a separate report.

I have discussed the findings and recommendations with the patient.
If applicable, a reminder letter will be sent to the patient
regarding the next appointment.

BI-RADS CATEGORY  4: Suspicious.

## 2022-05-17 SURGERY — DILATION AND EVACUATION, UTERUS
Anesthesia: General | Site: Vagina

## 2022-05-17 MED ORDER — PROPOFOL 10 MG/ML IV BOLUS
INTRAVENOUS | Status: AC
  Filled 2022-05-17: qty 20

## 2022-05-17 MED ORDER — KETOROLAC TROMETHAMINE 15 MG/ML IJ SOLN
15.0000 mg | INTRAMUSCULAR | Status: AC
  Administered 2022-05-17: 15 mg via INTRAVENOUS
  Filled 2022-05-17: qty 1

## 2022-05-17 MED ORDER — PHENYLEPHRINE 80 MCG/ML (10ML) SYRINGE FOR IV PUSH (FOR BLOOD PRESSURE SUPPORT)
PREFILLED_SYRINGE | INTRAVENOUS | Status: AC
  Filled 2022-05-17: qty 10

## 2022-05-17 MED ORDER — FERRIC SUBSULFATE 259 MG/GM EX SOLN
CUTANEOUS | Status: AC
  Filled 2022-05-17: qty 8

## 2022-05-17 MED ORDER — SOD CITRATE-CITRIC ACID 500-334 MG/5ML PO SOLN
30.0000 mL | ORAL | Status: AC
  Administered 2022-05-17: 30 mL via ORAL
  Filled 2022-05-17: qty 30

## 2022-05-17 MED ORDER — PROPOFOL 10 MG/ML IV BOLUS
INTRAVENOUS | Status: DC | PRN
  Administered 2022-05-17: 200 mg via INTRAVENOUS

## 2022-05-17 MED ORDER — IBUPROFEN 800 MG PO TABS: 800.0000 mg | ORAL_TABLET | Freq: Three times a day (TID) | ORAL | 0 refills | Status: DC | PRN

## 2022-05-17 MED ORDER — MIDAZOLAM HCL 2 MG/2ML IJ SOLN
INTRAMUSCULAR | Status: AC
  Filled 2022-05-17: qty 2

## 2022-05-17 MED ORDER — ONDANSETRON HCL 4 MG/2ML IJ SOLN
INTRAMUSCULAR | Status: DC | PRN
  Administered 2022-05-17: 4 mg via INTRAVENOUS

## 2022-05-17 MED ORDER — MISOPROSTOL 200 MCG PO TABS
200.0000 ug | ORAL_TABLET | Freq: Once | ORAL | Status: DC
  Filled 2022-05-17: qty 1

## 2022-05-17 MED ORDER — ALBUMIN HUMAN 5 % IV SOLN: INTRAVENOUS | Status: DC | PRN

## 2022-05-17 MED ORDER — ACETAMINOPHEN 500 MG PO TABS
1000.0000 mg | ORAL_TABLET | ORAL | Status: AC
  Administered 2022-05-17: 1000 mg via ORAL
  Filled 2022-05-17: qty 2

## 2022-05-17 MED ORDER — OXYCODONE HCL 5 MG/5ML PO SOLN: 5.0000 mg | Freq: Once | ORAL | Status: DC | PRN

## 2022-05-17 MED ORDER — DOXYCYCLINE HYCLATE 100 MG IV SOLR
200.0000 mg | INTRAVENOUS | Status: AC
  Administered 2022-05-17: 200 mg via INTRAVENOUS
  Filled 2022-05-17: qty 200

## 2022-05-17 MED ORDER — POVIDONE-IODINE 10 % EX SWAB
2.0000 | Freq: Once | CUTANEOUS | Status: AC
  Administered 2022-05-17: 2 via TOPICAL

## 2022-05-17 MED ORDER — 0.9 % SODIUM CHLORIDE (POUR BTL) OPTIME
TOPICAL | Status: DC | PRN
  Administered 2022-05-17: 1000 mL

## 2022-05-17 MED ORDER — FENTANYL CITRATE (PF) 100 MCG/2ML IJ SOLN: 25.0000 ug | INTRAMUSCULAR | Status: DC | PRN

## 2022-05-17 MED ORDER — ORAL CARE MOUTH RINSE: 15.0000 mL | Freq: Once | OROMUCOSAL | Status: AC

## 2022-05-17 MED ORDER — ONDANSETRON HCL 4 MG/2ML IJ SOLN
INTRAMUSCULAR | Status: AC
  Filled 2022-05-17: qty 2

## 2022-05-17 MED ORDER — DEXAMETHASONE SODIUM PHOSPHATE 10 MG/ML IJ SOLN
INTRAMUSCULAR | Status: DC | PRN
  Administered 2022-05-17: 10 mg via INTRAVENOUS

## 2022-05-17 MED ORDER — FENTANYL CITRATE (PF) 250 MCG/5ML IJ SOLN
INTRAMUSCULAR | Status: DC | PRN
  Administered 2022-05-17 (×2): 25 ug via INTRAVENOUS
  Administered 2022-05-17: 50 ug via INTRAVENOUS

## 2022-05-17 MED ORDER — MIDAZOLAM HCL 2 MG/2ML IJ SOLN
INTRAMUSCULAR | Status: DC | PRN
  Administered 2022-05-17: 2 mg via INTRAVENOUS

## 2022-05-17 MED ORDER — MISOPROSTOL 200 MCG PO TABS
ORAL_TABLET | ORAL | Status: DC | PRN
  Administered 2022-05-17: 200 ug via VAGINAL

## 2022-05-17 MED ORDER — PHENYLEPHRINE 80 MCG/ML (10ML) SYRINGE FOR IV PUSH (FOR BLOOD PRESSURE SUPPORT)
PREFILLED_SYRINGE | INTRAVENOUS | Status: DC | PRN
  Administered 2022-05-17 (×5): 80 ug via INTRAVENOUS

## 2022-05-17 MED ORDER — FERRIC SUBSULFATE 259 MG/GM EX SOLN
CUTANEOUS | Status: DC | PRN
  Administered 2022-05-17: 1 via TOPICAL

## 2022-05-17 MED ORDER — DEXAMETHASONE SODIUM PHOSPHATE 10 MG/ML IJ SOLN
INTRAMUSCULAR | Status: AC
  Filled 2022-05-17: qty 1

## 2022-05-17 MED ORDER — LACTATED RINGERS IV SOLN: INTRAVENOUS | Status: DC

## 2022-05-17 MED ORDER — LIDOCAINE 2% (20 MG/ML) 5 ML SYRINGE
INTRAMUSCULAR | Status: AC
  Filled 2022-05-17: qty 5

## 2022-05-17 MED ORDER — FENTANYL CITRATE (PF) 250 MCG/5ML IJ SOLN
INTRAMUSCULAR | Status: AC
  Filled 2022-05-17: qty 5

## 2022-05-17 MED ORDER — LIDOCAINE 2% (20 MG/ML) 5 ML SYRINGE
INTRAMUSCULAR | Status: DC | PRN
  Administered 2022-05-17: 60 mg via INTRAVENOUS
  Administered 2022-05-17: 40 mg via INTRAVENOUS

## 2022-05-17 MED ORDER — CHLORHEXIDINE GLUCONATE 0.12 % MT SOLN
15.0000 mL | Freq: Once | OROMUCOSAL | Status: AC
  Administered 2022-05-17: 15 mL via OROMUCOSAL
  Filled 2022-05-17: qty 15

## 2022-05-17 MED ORDER — OXYCODONE HCL 5 MG PO TABS: 5.0000 mg | ORAL_TABLET | Freq: Once | ORAL | Status: DC | PRN

## 2022-05-17 MED ORDER — OXYCODONE HCL 5 MG PO TABS: 5.0000 mg | ORAL_TABLET | Freq: Four times a day (QID) | ORAL | 0 refills | Status: DC | PRN

## 2022-05-17 MED ORDER — KETOROLAC TROMETHAMINE 30 MG/ML IJ SOLN
INTRAMUSCULAR | Status: DC | PRN
  Administered 2022-05-17: 15 mg via INTRAVENOUS

## 2022-05-17 MED ORDER — AMISULPRIDE (ANTIEMETIC) 5 MG/2ML IV SOLN: 10.0000 mg | Freq: Once | INTRAVENOUS | Status: DC | PRN

## 2022-05-17 SURGICAL SUPPLY — 26 items
APPLICATOR COTTON TIP 6 STRL (MISCELLANEOUS) IMPLANT
APPLICATOR COTTON TIP 6IN STRL (MISCELLANEOUS) ×1
CATH ROBINSON RED A/P 16FR (CATHETERS) IMPLANT
FILTER UTR ASPR ASSEMBLY (MISCELLANEOUS) ×1 IMPLANT
GLOVE BIO SURGEON STRL SZ7.5 (GLOVE) ×1 IMPLANT
GLOVE BIOGEL PI IND STRL 7.0 (GLOVE) ×1 IMPLANT
GOWN STRL REUS W/ TWL LRG LVL3 (GOWN DISPOSABLE) ×1 IMPLANT
GOWN STRL REUS W/ TWL XL LVL3 (GOWN DISPOSABLE) ×1 IMPLANT
GOWN STRL REUS W/TWL LRG LVL3 (GOWN DISPOSABLE) ×1
GOWN STRL REUS W/TWL XL LVL3 (GOWN DISPOSABLE) ×1
HOSE CONNECTING 18IN BERKELEY (TUBING) ×1 IMPLANT
KIT BERKELEY 1ST TRI 3/8 NO TR (MISCELLANEOUS) ×1 IMPLANT
KIT BERKELEY 1ST TRIMESTER 3/8 (MISCELLANEOUS) ×1 IMPLANT
NS IRRIG 1000ML POUR BTL (IV SOLUTION) ×1 IMPLANT
PACK VAGINAL MINOR WOMEN LF (CUSTOM PROCEDURE TRAY) ×1 IMPLANT
PAD OB MATERNITY 4.3X12.25 (PERSONAL CARE ITEMS) ×1 IMPLANT
SET BERKELEY SUCTION TUBING (SUCTIONS) ×1 IMPLANT
SUT VIC AB 2-0 CT1 27 (SUTURE) ×1
SUT VIC AB 2-0 CT1 TAPERPNT 27 (SUTURE) IMPLANT
TOWEL GREEN STERILE FF (TOWEL DISPOSABLE) ×2 IMPLANT
UNDERPAD 30X36 HEAVY ABSORB (UNDERPADS AND DIAPERS) ×1 IMPLANT
VACURETTE 10 RIGID CVD (CANNULA) IMPLANT
VACURETTE 7MM CVD STRL WRAP (CANNULA) IMPLANT
VACURETTE 8 RIGID CVD (CANNULA) IMPLANT
VACURETTE 8MM F TIP (MISCELLANEOUS) IMPLANT
VACURETTE 9 RIGID CVD (CANNULA) IMPLANT

## 2022-05-17 NOTE — Anesthesia Procedure Notes (Signed)
Procedure Name: LMA Insertion Date/Time: 05/17/2022 10:14 AM  Performed by: Michele Rockers, CRNAPre-anesthesia Checklist: Patient identified, Emergency Drugs available, Suction available and Patient being monitored Patient Re-evaluated:Patient Re-evaluated prior to induction Oxygen Delivery Method: Circle system utilized Preoxygenation: Pre-oxygenation with 100% oxygen Induction Type: IV induction Ventilation: Mask ventilation without difficulty LMA: LMA inserted LMA Size: 4.0 Number of attempts: 1 Placement Confirmation: positive ETCO2 and breath sounds checked- equal and bilateral Tube secured with: Tape Dental Injury: Teeth and Oropharynx as per pre-operative assessment

## 2022-05-17 NOTE — Anesthesia Preprocedure Evaluation (Addendum)
Anesthesia Evaluation  Patient identified by MRN, date of birth, ID band Patient awake    Reviewed: Allergy & Precautions, NPO status , Patient's Chart, lab work & pertinent test results  History of Anesthesia Complications Negative for: history of anesthetic complications  Airway Mallampati: II  TM Distance: >3 FB Neck ROM: Full    Dental  (+) Missing,    Pulmonary former smoker   Pulmonary exam normal        Cardiovascular negative cardio ROS Normal cardiovascular exam     Neuro/Psych negative neurological ROS  negative psych ROS   GI/Hepatic negative GI ROS, Neg liver ROS,,,  Endo/Other  BMI 39  Renal/GU negative Renal ROS  negative genitourinary   Musculoskeletal negative musculoskeletal ROS (+)    Abdominal   Peds  Hematology negative hematology ROS (+)   Anesthesia Other Findings Day of surgery medications reviewed with patient.  Reproductive/Obstetrics Missed Ab                              Anesthesia Physical Anesthesia Plan  ASA: 2  Anesthesia Plan: General   Post-op Pain Management: Tylenol PO (pre-op)*   Induction: Intravenous  PONV Risk Score and Plan: 3 and Treatment may vary due to age or medical condition, Midazolam, Scopolamine patch - Pre-op, Dexamethasone and Ondansetron  Airway Management Planned: LMA  Additional Equipment: None  Intra-op Plan:   Post-operative Plan: Extubation in OR  Informed Consent: I have reviewed the patients History and Physical, chart, labs and discussed the procedure including the risks, benefits and alternatives for the proposed anesthesia with the patient or authorized representative who has indicated his/her understanding and acceptance.     Dental advisory given  Plan Discussed with: CRNA  Anesthesia Plan Comments:         Anesthesia Quick Evaluation

## 2022-05-17 NOTE — Transfer of Care (Signed)
Immediate Anesthesia Transfer of Care Note  Patient: Brittany Quinn  Procedure(s) Performed: DILATATION AND EVACUATION (Vagina )  Patient Location: PACU  Anesthesia Type:General  Level of Consciousness: awake, alert , oriented, and responds to stimulation  Airway & Oxygen Therapy: Patient Spontanous Breathing and Patient connected to nasal cannula oxygen  Post-op Assessment: Report given to RN, Post -op Vital signs reviewed and stable, and Patient moving all extremities X 4  Post vital signs: Reviewed and stable  Last Vitals:  Vitals Value Taken Time  BP 118/68 05/17/22 1215  Temp 36.4 C 05/17/22 1140  Pulse 67 05/17/22 1217  Resp 19 05/17/22 1217  SpO2 100 % 05/17/22 1217  Vitals shown include unvalidated device data.  Last Pain:  Vitals:   05/17/22 1200  TempSrc:   PainSc: Asleep      Patients Stated Pain Goal: 0 (41/63/84 [id number])  Complications: No notable events documented.

## 2022-05-17 NOTE — Anesthesia Postprocedure Evaluation (Signed)
Anesthesia Post Note  Patient: Brittany Quinn  Procedure(s) Performed: DILATATION AND EVACUATION (Vagina )     Patient location during evaluation: PACU Anesthesia Type: General Level of consciousness: awake and alert Pain management: pain level controlled Vital Signs Assessment: post-procedure vital signs reviewed and stable Respiratory status: spontaneous breathing, nonlabored ventilation and respiratory function stable Cardiovascular status: blood pressure returned to baseline Postop Assessment: no apparent nausea or vomiting Anesthetic complications: no   No notable events documented.  Last Vitals:  Vitals:   05/17/22 1215 05/17/22 1230  BP: 118/68 121/70  Pulse: 77 71  Resp: 20 20  Temp:  36.7 C  SpO2: 100% 100%    Last Pain:  Vitals:   05/17/22 1230  TempSrc:   PainSc: 0-No pain                 Marthenia Rolling

## 2022-05-17 NOTE — Discharge Instructions (Signed)
D&E (Dilation and Evacuation) Dilation and evacuation (D&E) is a minor operation. It involves stretching (dilation) the cervix and evacuation of the uterus. During the procedure, the cervix is dilated and tissue is gently suctioned from the inside of the uterus.  REASONS FOR DOING D&E Removal of retained placenta after giving birth.  Abortion. Miscarriage.  RISKS AND COMPLICATIONS Putting a hole (perforation) in the uterus. Excessive bleeding after the D&E.  Infection of the uterus.  Damage to the cervix.  Developing scar tissue (adhesions) inside the uterus, later causing abnormal bleeding or no monthly bleeding (amenorrhea) or problems with fertility. Complications from general or local anesthetic.     PROCEDURE You may be given a drug to make you sleep (general anesthetic) or a drug that numbs the area (local anesthetic) in and around the cervix.  You will lie on your back with your legs in stirrups.  A curved tool (suction curette) will be used to evacuate the uterus and will then be removed.  This usually takes around 15 to 30 minutes.  AFTER THE PROCEDURE You will rest in the recovery room until you are stable and feel ready to go home.  You may feel sick to your stomach (nauseous) or throw up (vomit) if you had general anesthesia.  You may have light cramping and bleeding for a couple days to 2 weeks after the procedure.  Your uterus needs to make new lining after a D&E. This may make your next period late.   HOME CARE INSTRUCTIONS Do not drive for 24 hours.  Wait 1 week before returning to strenuous activities.  You may resume your usual diet.  Drink enough water and fluids to keep your urine clear or pale yellow.  You should return to your usual bowel function. If constipation occurs, you may:  Take a mild laxative with permission from your caregiver.  Add fruit and bran to your diet.  Take showers instead of baths for two weeks Do not go swimming or use a hot tub until  your caregiver gives you permission.  Have someone with you or available for you the first 24 to 48 hours, especially if you had a general anesthetic.  Do not douche, use tampons, or have intercourse until after your follow-up appointment, or when your caregiver approves.  Only take over-the-counter or prescription medicines for pain, discomfort, or fever as directed by your caregiver. Do not take aspirin. It can cause bleeding.  If a prescription has been given to you, follow your caregiver's directions. You may be given a medicine that kills germs (antibiotic) to prevent an infection.  Keep all your follow-up appointments recommended by your caregiver.   SEEK MEDICAL CARE IF: You have increasing cramps or pain not relieved with medicine.  You develop belly (abdominal) pain, which does not seem to be related to the same area as your earlier cramping and pain.  You feel dizzy or feel like fainting.  You have a bad smelling vaginal discharge.  You develop a rash.  You develop a reaction or allergy to your medicine.   SEEK IMMEDIATE MEDICAL CARE IF: Bleeding is heavier than a normal menstrual period.  You have an oral temperature above 101F, not controlled by medicine.  You develop chest pain.  You develop shortness of breath.  You pass out.  You develop heavy vaginal bleeding with or without blood clots.   MAKE SURE YOU: Understand these instructions.  Will watch your condition.  Will get help right away if you are   not doing well or get worse.   UPDATED HEALTH PRACTICES A Pap smear is done to screen for cervical cancer.  The first Pap smear should be done at age 21.  Between ages 21 and 29, Pap smears are repeated every 2 years.  Beginning at age 30, you are advised to have a Pap smear every 3 years as long as your past 3 Pap smears have been normal.  Some women have medical problems that increase the chance of getting cervical cancer. Talk to your caregiver about these problems. It  is especially important to talk to your caregiver if a new problem develops soon after your last Pap smear. In these cases, your caregiver may recommend more frequent screening and Pap smears.  The above recommendations are the same for women who have or have not gotten the vaccine for HPV (human papillomavirus).  If you had a uterus removal (hysterectomy) for a problem that was not a cancer or a condition that could lead to cancer, then you no longer need Pap smears.  If you are between ages 65 and 70, and you have had normal Pap smears going back 10 years, you no longer need Pap smears.  If you have had past treatment for cervical cancer or a condition that could lead to cancer, you need Pap smears and screening for cancer for at least 20 years after your treatment.  Continue monthly breast self-examinations. Your caregiver can provide information and instructions for breast self-examination.  [employer] Patient Information 2011 [employer].  

## 2022-05-17 NOTE — Interval H&P Note (Signed)
History and Physical Interval Note:  05/17/2022 9:59 AM  Brittany Quinn  has presented today for surgery, with the diagnosis of Missed AB.  The various methods of treatment have been discussed with the patient and family. After consideration of risks, benefits and other options for treatment, the patient has consented to  Procedure(s): DILATATION AND EVACUATION (N/A) as a surgical intervention.  The patient's history has been reviewed, patient examined, no change in status, stable for surgery.  I have reviewed the patient's chart and labs.  Questions were answered to the patient's satisfaction.     [name]

## 2022-05-17 NOTE — Op Note (Signed)
Brittany Quinn PROCEDURE DATE: 05/17/2022  PREOPERATIVE DIAGNOSIS: 10 week missed abortion POSTOPERATIVE DIAGNOSIS: The same PROCEDURE:     Dilation and Evacuation SURGEON:  Dr. Arlina Robes  INDICATIONS: 32 y.o. [id number] with MAB at [redacted] weeks gestation, needing surgical completion.  Risks of surgery were discussed with the patient including but not limited to: bleeding which may require transfusion; infection which may require antibiotics; injury to uterus or surrounding organs; need for additional procedures including laparotomy or laparoscopy; possibility of intrauterine scarring which may impair future fertility; and other postoperative/anesthesia complications. Written informed consent was obtained.    FINDINGS:  A 13 week size uterus, moderate amounts of products of conception, specimen sent to pathology.  ANESTHESIA:   GETA INTRAVENOUS FLUIDS:  As recorded ESTIMATED BLOOD LOSS: 800 cc SPECIMENS:  Products of conception sent to pathology COMPLICATIONS:  None immediate.  PROCEDURE DETAILS:  The patient received intravenous Doxycycline while in the preoperative area.  She was then taken to the operating room where monitored intravenous sedation was administered and was found to be adequate.  After an adequate timeout was performed, she was placed in the dorsal lithotomy position and examined; then prepped and draped in the sterile manner.   Her bladder was catheterized for an unmeasured amount of clear, yellow urine. Bimanual exam found the uterus to be at least 11 weeks in size. A vaginal speculum was then placed in the patient's vagina and a single tooth tenaculum was applied to the anterior lip of the cervix.  The uterus was sounded to 13. The cervix was gently dilated to accommodate a 7 mm suction curette that was gently advanced to the uterine fundus.  I attempted to use a larger suction curette, but was unable to pass by the internal os. The suction device was then activated and curette slowly  rotated to clear the uterus of products of conception.  A sharp curettage was then performed to confirm complete emptying of the uterus. There was moderate bleeding noted as suspected with a late first trimester/early second trimester MAB and the tenaculum removed with good hemostasis noted. A 2/0 vicryl was used to closed a small laceration on the cervix created from the tenaculum.   All instruments were removed from the patient's vagina.  Sponge and instrument counts were correct times two.   200 mcg of Cytotec was placed rect at the conclusion of the case.The patient tolerated the procedure well and was taken to the recovery area awake, and in stable condition.  The patient will be discharged to home as per PACU criteria.  Routine postoperative instructions given.  She was prescribed Percocet, Ibuprofen.   She will follow up in the clinic  for postoperative evaluation.   Arlina Robes MD, [facility], [facility]

## 2022-05-18 ENCOUNTER — Encounter (HOSPITAL_COMMUNITY): Payer: Self-pay | Admitting: Obstetrics and Gynecology

## 2022-05-23 ENCOUNTER — Encounter: Payer: Self-pay | Admitting: Certified Nurse Midwife

## 2022-05-23 LAB — SURGICAL PATHOLOGY

## 2022-05-24 ENCOUNTER — Encounter (HOSPITAL_COMMUNITY): Payer: Self-pay

## 2022-05-24 ENCOUNTER — Other Ambulatory Visit: Payer: Self-pay

## 2022-05-24 ENCOUNTER — Emergency Department (HOSPITAL_COMMUNITY)
Admission: EM | Admit: 2022-05-24 | Discharge: 2022-05-24 | Disposition: A | Discharge status: Home / Self Care | Payer: No Typology Code available for payment source | Attending: Emergency Medicine | Admitting: Emergency Medicine

## 2022-05-24 ENCOUNTER — Emergency Department (HOSPITAL_COMMUNITY): Payer: No Typology Code available for payment source

## 2022-05-24 DIAGNOSIS — N719 Inflammatory disease of uterus, unspecified: Secondary | ICD-10-CM

## 2022-05-24 DIAGNOSIS — O045 Genital tract and pelvic infection following (induced) termination of pregnancy: Secondary | ICD-10-CM | POA: Diagnosis not present

## 2022-05-24 DIAGNOSIS — N898 Other specified noninflammatory disorders of vagina: Secondary | ICD-10-CM | POA: Diagnosis present

## 2022-05-24 LAB — URINALYSIS, ROUTINE W REFLEX MICROSCOPIC
Bilirubin Urine: NEGATIVE
Glucose, UA: NEGATIVE mg/dL
Ketones, ur: NEGATIVE mg/dL
Nitrite: NEGATIVE
Protein, ur: NEGATIVE mg/dL
RBC / HPF: >50 RBC/hpf (ref 0–5)
Specific Gravity, Urine: 1.018 (ref 1.005–1.030)
pH: 5 (ref 5.0–8.0)

## 2022-05-24 LAB — CBC WITH DIFFERENTIAL/PLATELET
Abs Immature Granulocytes: 0.04 10*3/uL (ref 0.00–0.07)
Basophils Absolute: 0 10*3/uL (ref 0.0–0.1)
Basophils Relative: 0 %
Eosinophils Absolute: 0.1 10*3/uL (ref 0.0–0.5)
Eosinophils Relative: 1 %
HCT: 27.5 % — ABNORMAL LOW (ref 36.0–46.0)
Hemoglobin: 9 g/dL — ABNORMAL LOW (ref 12.0–15.0)
Immature Granulocytes: 0 %
Lymphocytes Relative: 33 %
Lymphs Abs: 3.5 10*3/uL (ref 0.7–4.0)
MCH: 31.7 pg (ref 26.0–34.0)
MCHC: 32.7 g/dL (ref 30.0–36.0)
MCV: 96.8 fL (ref 80.0–100.0)
Monocytes Absolute: 0.7 10*3/uL (ref 0.1–1.0)
Monocytes Relative: 6 %
Neutro Abs: 6.2 10*3/uL (ref 1.7–7.7)
Neutrophils Relative %: 60 %
Platelets: 307 10*3/uL (ref 150–400)
RBC: 2.84 MIL/uL — ABNORMAL LOW (ref 3.87–5.11)
RDW: 13.1 % (ref 11.5–15.5)
WBC: 10.4 10*3/uL (ref 4.0–10.5)
nRBC: 0.2 % (ref 0.0–0.2)

## 2022-05-24 LAB — WET PREP, GENITAL
Clue Cells Wet Prep HPF POC: NONE SEEN
Sperm: NONE SEEN
Trich, Wet Prep: NONE SEEN
WBC, Wet Prep HPF POC: <10 (ref <10)
Yeast Wet Prep HPF POC: NONE SEEN

## 2022-05-24 LAB — COMPREHENSIVE METABOLIC PANEL
ALT: 21 U/L (ref 0–44)
AST: 19 U/L (ref 15–41)
Albumin: 3.4 g/dL — ABNORMAL LOW (ref 3.5–5.0)
Alkaline Phosphatase: 46 U/L (ref 38–126)
Anion gap: 8 (ref 5–15)
BUN: <5 mg/dL — ABNORMAL LOW (ref 6–20)
CO2: 24 mmol/L (ref 22–32)
Calcium: 8.8 mg/dL — ABNORMAL LOW (ref 8.9–10.3)
Chloride: 104 mmol/L (ref 98–111)
Creatinine, Ser: 0.67 mg/dL (ref 0.44–1.00)
GFR, Estimated: >60 mL/min (ref >60)
Glucose, Bld: 102 mg/dL — ABNORMAL HIGH (ref 70–99)
Potassium: 3.4 mmol/L — ABNORMAL LOW (ref 3.5–5.1)
Sodium: 136 mmol/L (ref 135–145)
Total Bilirubin: 1 mg/dL (ref 0.3–1.2)
Total Protein: 6.3 g/dL — ABNORMAL LOW (ref 6.5–8.1)

## 2022-05-24 LAB — HCG, QUANTITATIVE, PREGNANCY: hCG, Beta Chain, Quant, S: 328 m[IU]/mL — ABNORMAL HIGH (ref <5)

## 2022-05-24 MED ORDER — AMOXICILLIN-POT CLAVULANATE 875-125 MG PO TABS: 1.0000 | ORAL_TABLET | Freq: Two times a day (BID) | ORAL | 0 refills | Status: DC

## 2022-05-24 MED ORDER — MORPHINE SULFATE (PF) 4 MG/ML IV SOLN
4.0000 mg | Freq: Once | INTRAVENOUS | Status: AC
  Administered 2022-05-24: 4 mg via INTRAVENOUS
  Filled 2022-05-24: qty 1

## 2022-05-24 MED ORDER — GENTAMICIN SULFATE 40 MG/ML IJ SOLN
1.5000 mg/kg | Freq: Three times a day (TID) | INTRAVENOUS | Status: DC
  Administered 2022-05-24: 150 mg via INTRAVENOUS
  Filled 2022-05-24 (×3): qty 3.75

## 2022-05-24 MED ORDER — CLINDAMYCIN PHOSPHATE 900 MG/50ML IV SOLN
900.0000 mg | Freq: Once | INTRAVENOUS | Status: AC
  Administered 2022-05-24: 900 mg via INTRAVENOUS
  Filled 2022-05-24: qty 50

## 2022-05-24 MED ORDER — FERROUS SULFATE 325 (65 FE) MG PO TABS: 325.0000 mg | ORAL_TABLET | ORAL | 0 refills | Status: DC

## 2022-05-24 NOTE — ED Triage Notes (Signed)
Pt came in post [facility] she had on 05/17/22. Over the weekend she continued to bleed, then Sunday evening bleeding stopped & Monday she began having large amt of foul smelling vaginal d/c & then began having cramps & sever dizziness & light headedness. Pt also endorse a ringing in her ears begin since then as well, A/Ox4.

## 2022-05-24 NOTE — Discharge Instructions (Addendum)
You were seen in the department today for your endometritis.  While in the emergency department completely full history and physical exam.  Based off of our findings believe you are suffering from a condition known as endometritis which is an inflammation of the uterus after your procedure.  We gave you a dose of antibiotics in the emergency department he will begin taking Augmentin 2 times daily for the next 14 days. Please begin taking Ferrous sulfate 325mg  every other day. Please see the above number for Dr. Arlina Robes.  Please follow-up with your OB/GYN by calling them in the morning to let them know you are seen in the emergency department.  If you begin having bleeding with soaking through 1 pad every hour for more than 2 hours please return to the emergency department if you begin having worsening lower abdominal pain with nausea and vomiting or loss of consciousness please return the emergency department soon as possible. Sincerely, Clydie Braun, PGY-2

## 2022-05-24 NOTE — ED Provider Notes (Signed)
[facility] Provider Note   CSN: [id number] Arrival date & time: 05/24/22  1610     History  Chief Complaint  Patient presents with   Vaginal Discharge   Dizziness    Brittany Quinn is a 32 y.o. female.  Brittany Quinn is a 32 yo D&C last thurs @ 11 weeks Some post procedural bleeding that stopped on Sunday. Spotting and cramping starting Tuesday that has progressively worsened with dizziness and tinitus, cloudy colored and foul smelling discharge. No  fevers chills, urgency or frequency or dysuria. Pain with bearing down. 1 sexual partner. Had doxycycline and cytotek during procedure.      Vaginal Discharge Dizziness      Home Medications Prior to Admission medications   Medication Sig Start Date End Date Taking? Authorizing Provider  amoxicillin-clavulanate (AUGMENTIN) 875-125 MG tablet Take 1 tablet by mouth every 12 (twelve) hours for 14 days. 05/24/22 06/07/22 Yes Donzetta Matters, MD  ferrous sulfate 325 (65 FE) MG tablet Take 1 tablet (325 mg total) by mouth every other day for 30 doses. 05/24/22 07/22/22 Yes Donzetta Matters, MD  ibuprofen (ADVIL) 800 MG tablet Take 1 tablet (800 mg total) by mouth every 8 (eight) hours as needed. 05/17/22   [name], MD  oxyCODONE (OXY IR/ROXICODONE) 5 MG immediate release tablet Take 1 tablet (5 mg total) by mouth every 6 (six) hours as needed for severe pain. 05/17/22   [name], MD      Allergies    Patient has no known allergies.    Review of Systems   Review of Systems  Genitourinary:  Positive for vaginal discharge.  Neurological:  Positive for dizziness.    Physical Exam Updated Vital Signs BP 122/78 (BP Location: Right Arm)   Pulse 82   Temp 98.8 F (37.1 C) (Oral)   Resp 14   LMP 02/25/2022 (Exact Date)   SpO2 99%  Physical Exam Vitals and nursing note reviewed.  Constitutional:      General: She is not in acute distress.    Appearance: She is well-developed.  HENT:      Head: Normocephalic and atraumatic.  Eyes:     Conjunctiva/sclera: Conjunctivae normal.  Cardiovascular:     Rate and Rhythm: Normal rate and regular rhythm.     Heart sounds: No murmur heard. Pulmonary:     Effort: Pulmonary effort is normal. No respiratory distress.     Breath sounds: Normal breath sounds.  Abdominal:     General: There is no distension.     Palpations: Abdomen is soft.     Tenderness: There is abdominal tenderness. There is no right CVA tenderness or left CVA tenderness.  Genitourinary:    Vagina: Vaginal discharge and bleeding present.     Cervix: Cervical motion tenderness and cervical bleeding present. No friability or lesion.     Adnexa:        Left: Tenderness present.   Musculoskeletal:        General: No swelling.     Cervical back: Neck supple.  Skin:    General: Skin is warm and dry.     Capillary Refill: Capillary refill takes less than 2 seconds.  Neurological:     Mental Status: She is alert.  Psychiatric:        Mood and Affect: Mood normal.     ED Results / Procedures / Treatments   Labs (all labs ordered are listed, but only abnormal results are displayed)  Labs Reviewed  URINALYSIS, ROUTINE W REFLEX MICROSCOPIC - Abnormal; Notable for the following components:      Result Value   APPearance HAZY (*)    Hgb urine dipstick LARGE (*)    Leukocytes,Ua LARGE (*)    Bacteria, UA RARE (*)    All other components within normal limits  COMPREHENSIVE METABOLIC PANEL - Abnormal; Notable for the following components:   Potassium 3.4 (*)    Glucose, Bld 102 (*)    BUN <5 (*)    Calcium 8.8 (*)    Total Protein 6.3 (*)    Albumin 3.4 (*)    All other components within normal limits  CBC WITH DIFFERENTIAL/PLATELET - Abnormal; Notable for the following components:   RBC 2.84 (*)    Hemoglobin 9.0 (*)    HCT 27.5 (*)    All other components within normal limits  HCG, QUANTITATIVE, PREGNANCY - Abnormal; Notable for the following components:    hCG, Beta Chain, Quant, S 328 (*)    All other components within normal limits  WET PREP, GENITAL  I-STAT BETA HCG BLOOD, ED ([facility], [facility], [facility] ONLY)  GC/CHLAMYDIA PROBE AMP ([place]) NOT AT [facility]    EKG None  Radiology US PELVIC COMPLETE W TRANSVAGINAL AND TORSION R/O  Result Date: 05/24/2022 CLINICAL DATA:  Pelvic pain. EXAM: TRANSABDOMINAL AND TRANSVAGINAL ULTRASOUND OF PELVIS DOPPLER ULTRASOUND OF OVARIES TECHNIQUE: Both transabdominal and transvaginal ultrasound examinations of the pelvis were performed. Transabdominal technique was performed for global imaging of the pelvis including uterus, ovaries, adnexal regions, and pelvic cul-de-sac. It was necessary to proceed with endovaginal exam following the transabdominal exam to visualize the endometrium and ovaries. Color and duplex Doppler ultrasound was utilized to evaluate blood flow to the ovaries. COMPARISON:  None Available. FINDINGS: Uterus Measurements: 11.4 x 6.4 x 8.7 cm = volume: 331 mL. No fibroids or other mass visualized. Endometrium Thickness: 5 cm. The endometrium is thickened and heterogeneous. Findings may represent endometritis, endometrial hyperplasia, neoplasm, polyp or related to hormone replacement therapy. Other etiologies are not excluded. Right ovary Measurements: 2.3 x 1.2 x 2.6 cm = volume: 3.8 mL. Normal appearance/no adnexal mass. Left ovary Measurements: 2.7 x 1.2 x 2.1 cm = volume: 3.7 mL. Normal appearance/no adnexal mass. Pulsed Doppler evaluation of both ovaries demonstrates normal low-resistance arterial and venous waveforms. Other findings No abnormal free fluid. IMPRESSION: 1. Thickened and heterogeneous endometrium of indeterminate etiology. Clinical correlation recommended. Hysteroscopy may provide better evaluation if clinically indicated. 2. Unremarkable ovaries. Electronically Signed   By: Anner Crete M.D.   On: 05/24/2022 21:24    Procedures Procedures    Medications Ordered in ED Medications   gentamicin (GARAMYCIN) 150 mg in dextrose 5 % 50 mL IVPB (0 mg Intravenous Stopped 05/24/22 2035)  clindamycin (CLEOCIN) IVPB 900 mg (0 mg Intravenous Stopped 05/24/22 2153)  morphine (PF) 4 MG/ML injection 4 mg (4 mg Intravenous Given 05/24/22 2152)    ED Course/ Medical Decision Making/ A&P                             Medical Decision Making Astha Munsen is a 32 year old female past medical history as documented above presenting to the emergency department today for pelvic pain in the setting of recent I&D.  On presentation to the emergency department patient's blood pressure 122/78, temperature 98.8, pulse 82 she satting 99% on room air.  Patient's abdomen is tender in the suprapubic region but is  not peritonitic.  My initial differential for this patient given her recent procedure was endometritis, retained products of conception, PID, uterine perforation.  As previously mentioned patient was hemodynamically stable making an acute surgical emergency less likely therefore I felt that we had more time to work the patient up more broadly.  Patient's urine shows rare bacteria large leukocyte esterase and a large amount of blood.  She is monogamous with 1 partner and is sure of his sexual history.  Neither one of them have recently been diagnosed with STIs therefore I was less concerned for PID in the acute phase.  I empirically treated the patient with clindamycin and gentamicin in the emergency department to cover for endometritis.  My plan was to perform a pelvic exam although patient was taken for trans vaginal ultrasound prior to me completing the pelvic exam.  Transvaginal ultrasound showed a thickened and heterogenous endometrium this could correlate with endometritis which is the most likely diagnosis given her recent procedure.  I obtained labs on the patient patient CBC slightly elevated with a WBC of [id number] no neutrophilic shift interestingly patient's hemoglobin had dropped from 15-9.  I do not  believe the patient is acutely symptomatic she does endorse some dizziness but denies syncope or dyspnea on exertion associated with her blood loss.  Patient CMP grossly unremarkable beta-hCG slightly elevated at 328.  Pelvic exam patient does have cervical motion tenderness with correlates with her endometritis there is slight left adnexal tenderness however no evidence of abnormality on the left adnexa with ultrasound.  Did discuss this patient's course with OB/GYN I talked to Dr. Mora Bellman.  We discussed the patient's hemoglobin drop given the patient's vital stability she deemed that it was suitable for outpatient management she was therefore prescribed 325 mg of ferrous sulfate every other day.  We also discussed whether or not to admit the patient for IV antibiotics.  Given the patient's stability lack of fever lack of elevation in labs feel that she is early on her course therefore she will be treated on an outpatient basis with Augmentin.  The patient was comfortable with this plan Dr. Elly Modena set up outpatient follow-up for the patient to be followed up here in the next week and patient was discharged home to self-care.  Prior to discharge I gave the patient 1 dose of IV morphine for her pain which greatly alleviated any symptoms she had.   Amount and/or Complexity of Data Reviewed Labs: ordered. Radiology: ordered.  Risk OTC drugs. Prescription drug management.           Final Clinical Impression(s) / ED Diagnoses Final diagnoses:  Endometritis    Rx / DC Orders ED Discharge Orders          Ordered    amoxicillin-clavulanate (AUGMENTIN) 875-125 MG tablet  Every 12 hours        05/24/22 2149    ferrous sulfate 325 (65 FE) MG tablet  Every other day        05/24/22 2228              Donzetta Matters, MD 05/25/22 Nolberto Hanlon    Noemi Chapel, MD 05/27/22 [phone]

## 2022-05-25 LAB — GC/CHLAMYDIA PROBE AMP (~~LOC~~) NOT AT ARMC
Chlamydia: NEGATIVE
Comment: NEGATIVE
Comment: NORMAL
Neisseria Gonorrhea: NEGATIVE

## 2022-05-30 ENCOUNTER — Encounter: Payer: Self-pay | Admitting: Obstetrics and Gynecology

## 2022-05-30 ENCOUNTER — Other Ambulatory Visit: Payer: Self-pay

## 2022-05-30 ENCOUNTER — Ambulatory Visit (INDEPENDENT_AMBULATORY_CARE_PROVIDER_SITE_OTHER): Payer: No Typology Code available for payment source | Admitting: Obstetrics and Gynecology

## 2022-05-30 VITALS — BP 121/63 | HR 81 | Temp 98.4°F

## 2022-05-30 DIAGNOSIS — Z3A10 10 weeks gestation of pregnancy: Secondary | ICD-10-CM

## 2022-05-30 DIAGNOSIS — R102 Pelvic and perineal pain: Secondary | ICD-10-CM

## 2022-05-30 DIAGNOSIS — O039 Complete or unspecified spontaneous abortion without complication: Secondary | ICD-10-CM | POA: Diagnosis not present

## 2022-05-30 DIAGNOSIS — Z5189 Encounter for other specified aftercare: Secondary | ICD-10-CM

## 2022-05-30 NOTE — Progress Notes (Unsigned)
Obstetrics and Gynecology Visit Return Patient Evaluation  Appointment Date: 05/30/2022  Primary Care Provider: Patient, No Pcp Per  OBGYN Clinic: [facility] for [facility]   Chief Complaint: ED follow up   History of Present Illness:  Brittany Quinn is a 32 y.o. s/p 2/1 suction d&c for 10 week missed AB by Dr. Rip Harbour; pathology showed products of conception.  She presented to the ED on 2/8 for vaginal discharge. She was diagnosed with endometritis and sent home on Augmentin.  Patient states she has a little discomfort in the lower belly but no bleeding, fevers, chills, GI s/s. She confirms she is still taking the augmentin.   Review of Systems: as noted in the History of Present Illness.  Patient Active Problem List   Diagnosis Date Noted   Obesity 03/22/2020   Former smoker 12/17/2018   Medications:  Brittany Quinn had no medications administered during this visit. Current Outpatient Medications  Medication Sig Dispense Refill   amoxicillin-clavulanate (AUGMENTIN) 875-125 MG tablet Take 1 tablet by mouth every 12 (twelve) hours for 14 days. 28 tablet 0   ferrous sulfate 325 (65 FE) MG tablet Take 1 tablet (325 mg total) by mouth every other day for 30 doses. 30 tablet 0   ibuprofen (ADVIL) 800 MG tablet Take 1 tablet (800 mg total) by mouth every 8 (eight) hours as needed. 30 tablet 0   oxyCODONE (OXY IR/ROXICODONE) 5 MG immediate release tablet Take 1 tablet (5 mg total) by mouth every 6 (six) hours as needed for severe pain. (Patient not taking: Reported on 05/30/2022) 15 tablet 0   No current facility-administered medications for this visit.    Allergies: has No Known Allergies.  Physical Exam:  BP 121/63   Pulse 81   Temp 98.4 F (36.9 C)   LMP 02/25/2022 (Exact Date)   Breastfeeding Unknown  There is no height or weight on file to calculate BMI. General appearance: Well nourished, well developed female in no acute distress.  Abdomen: diffusely non  tender to palpation, non distended, and no masses, hernias Neuro/Psych:  Normal mood and affect.    Pelvic exam: deferred   Assessment: pt stable  Plan:  1. Pelvic pain - Urine Culture  2. Follow-up visit after miscarriage I re-read the ultrasound after the patient's visit and realized that I read her endometrial lining as 49m and not 5cm, which is unusual after a d&c. Patient called but went straight to voicemail.   Clinic RN contacted and asked to try and contact patient as well for an ASAP repeat transvag u/s and repeat hcg (was 328 on 2/8) and follow up visit.    RTC: after u/s.   CDurene RomansMD Attending [facility](Fish farm manager

## 2022-05-31 ENCOUNTER — Telehealth: Payer: Self-pay | Admitting: Obstetrics and Gynecology

## 2022-05-31 ENCOUNTER — Encounter: Payer: Self-pay | Admitting: Obstetrics and Gynecology

## 2022-05-31 ENCOUNTER — Telehealth: Payer: Self-pay | Admitting: General Practice

## 2022-05-31 NOTE — Addendum Note (Signed)
Addended by: Aletha Halim on: 05/31/2022 09:26 AM   Modules accepted: Orders

## 2022-05-31 NOTE — Telephone Encounter (Signed)
Called patient and discussed ultrasound appt for 2/19 @ 10am per Dr Ilda Basset. Also discussed lab appt to follow to check bhcg level. Patient verbalized understanding.

## 2022-05-31 NOTE — Telephone Encounter (Signed)
GYN Telephone Note Patient sent me mychart message and I called her and d/w her my concern for potential for retained products of conception, on re-review of ultrasound, and I recommend repeat u/s and hcg  Pt has another week of augmentin and recommend she stay on that and finish it out.   Durene Romans MD Attending Center for [employer] (Faculty Practice) 05/31/2022 Time: [id number]

## 2022-06-04 ENCOUNTER — Ambulatory Visit
Admission: RE | Admit: 2022-06-04 | Discharge: 2022-06-04 | Disposition: A | Discharge status: Home / Self Care | Payer: No Typology Code available for payment source | Source: Ambulatory Visit | Attending: Obstetrics and Gynecology | Admitting: Obstetrics and Gynecology

## 2022-06-04 ENCOUNTER — Other Ambulatory Visit: Payer: No Typology Code available for payment source

## 2022-06-04 ENCOUNTER — Other Ambulatory Visit: Payer: Self-pay

## 2022-06-04 DIAGNOSIS — Z5189 Encounter for other specified aftercare: Secondary | ICD-10-CM | POA: Diagnosis not present

## 2022-06-04 DIAGNOSIS — O039 Complete or unspecified spontaneous abortion without complication: Secondary | ICD-10-CM | POA: Diagnosis present

## 2022-06-04 LAB — URINE CULTURE

## 2022-06-05 ENCOUNTER — Other Ambulatory Visit (HOSPITAL_COMMUNITY): Payer: Self-pay | Admitting: Obstetrics & Gynecology

## 2022-06-05 ENCOUNTER — Telehealth: Payer: Self-pay

## 2022-06-05 DIAGNOSIS — O034 Incomplete spontaneous abortion without complication: Secondary | ICD-10-CM

## 2022-06-05 DIAGNOSIS — O021 Missed abortion: Secondary | ICD-10-CM

## 2022-06-05 HISTORY — DX: Incomplete spontaneous abortion without complication: O03.4

## 2022-06-05 LAB — BETA HCG QUANT (REF LAB): hCG Quant: 19 m[IU]/mL

## 2022-06-05 NOTE — Telephone Encounter (Signed)
Called and spoke with patient about potential surgery dates, patient opted for 2/22. Surgery date, time, location and preop instructions given.  No sure if patient understood or will comply, she remained silent on the phone, no verbal feedback and hung up on me after I had given her the instructions and asked if she had any questions.

## 2022-06-06 ENCOUNTER — Encounter (HOSPITAL_COMMUNITY): Payer: Self-pay | Admitting: Obstetrics & Gynecology

## 2022-06-06 ENCOUNTER — Encounter: Payer: Self-pay | Admitting: Obstetrics and Gynecology

## 2022-06-06 DIAGNOSIS — N390 Urinary tract infection, site not specified: Secondary | ICD-10-CM | POA: Insufficient documentation

## 2022-06-06 DIAGNOSIS — B952 Enterococcus as the cause of diseases classified elsewhere: Secondary | ICD-10-CM | POA: Insufficient documentation

## 2022-06-06 NOTE — Progress Notes (Signed)
PCP - none Cardiologist - n/a  Chest x-ray - n/a EKG - n/a Stress Test - n/a ECHO - n/a Cardiac Cath - n/a  ICD Pacemaker/Loop - n/a  Sleep Study -  n/a CPAP - none  Diabetes - n/a  NPO  Anesthesia review: n/a  STOP now taking any Aspirin (unless otherwise instructed by your surgeon), Aleve, Naproxen, Ibuprofen, Motrin, Advil, Goody's, BC's, all herbal medications, fish oil, and all vitamins.   Coronavirus Screening Do you have any of the following symptoms:  Cough yes/no: No Fever (>100.28F)  yes/no: No Runny nose yes/no: No Sore throat yes/no: No Difficulty breathing/shortness of breath  yes/no: No  Have you traveled in the last 14 days and where? yes/no: No  Patient verbalized understanding of instructions that were given via phone.

## 2022-06-07 ENCOUNTER — Ambulatory Visit (HOSPITAL_COMMUNITY)
Admission: RE | Admit: 2022-06-07 | Discharge: 2022-06-07 | Disposition: A | Discharge status: Home / Self Care | Payer: No Typology Code available for payment source | Source: Ambulatory Visit | Attending: Obstetrics & Gynecology | Admitting: Obstetrics & Gynecology

## 2022-06-07 ENCOUNTER — Encounter (HOSPITAL_COMMUNITY): Payer: Self-pay | Admitting: Obstetrics & Gynecology

## 2022-06-07 ENCOUNTER — Other Ambulatory Visit: Payer: Self-pay

## 2022-06-07 ENCOUNTER — Ambulatory Visit (HOSPITAL_BASED_OUTPATIENT_CLINIC_OR_DEPARTMENT_OTHER): Payer: No Typology Code available for payment source | Admitting: Anesthesiology

## 2022-06-07 ENCOUNTER — Encounter (HOSPITAL_COMMUNITY)
Admission: RE | Disposition: A | Discharge status: Home / Self Care | Payer: Self-pay | Source: Ambulatory Visit | Attending: Obstetrics & Gynecology

## 2022-06-07 ENCOUNTER — Ambulatory Visit (HOSPITAL_COMMUNITY): Payer: No Typology Code available for payment source | Admitting: Anesthesiology

## 2022-06-07 DIAGNOSIS — Z3A10 10 weeks gestation of pregnancy: Secondary | ICD-10-CM | POA: Diagnosis not present

## 2022-06-07 DIAGNOSIS — O021 Missed abortion: Secondary | ICD-10-CM

## 2022-06-07 DIAGNOSIS — Z6839 Body mass index (BMI) 39.0-39.9, adult: Secondary | ICD-10-CM | POA: Diagnosis not present

## 2022-06-07 DIAGNOSIS — Z87891 Personal history of nicotine dependence: Secondary | ICD-10-CM | POA: Diagnosis not present

## 2022-06-07 DIAGNOSIS — O034 Incomplete spontaneous abortion without complication: Secondary | ICD-10-CM | POA: Diagnosis present

## 2022-06-07 DIAGNOSIS — Z3A00 Weeks of gestation of pregnancy not specified: Secondary | ICD-10-CM

## 2022-06-07 HISTORY — PX: OPERATIVE ULTRASOUND: SHX5996

## 2022-06-07 HISTORY — PX: DILATION AND EVACUATION: SHX1459

## 2022-06-07 LAB — TYPE AND SCREEN
ABO/RH(D): B POS
Antibody Screen: NEGATIVE

## 2022-06-07 LAB — CBC
HCT: 32.7 % — ABNORMAL LOW (ref 36.0–46.0)
Hemoglobin: 10.1 g/dL — ABNORMAL LOW (ref 12.0–15.0)
MCH: 29.9 pg (ref 26.0–34.0)
MCHC: 30.9 g/dL (ref 30.0–36.0)
MCV: 96.7 fL (ref 80.0–100.0)
Platelets: 304 10*3/uL (ref 150–400)
RBC: 3.38 MIL/uL — ABNORMAL LOW (ref 3.87–5.11)
RDW: 12.7 % (ref 11.5–15.5)
WBC: 6 10*3/uL (ref 4.0–10.5)
nRBC: 0 % (ref 0.0–0.2)

## 2022-06-07 SURGERY — DILATION AND EVACUATION, UTERUS
Anesthesia: General | Site: Vagina

## 2022-06-07 MED ORDER — LIDOCAINE 2% (20 MG/ML) 5 ML SYRINGE
INTRAMUSCULAR | Status: DC | PRN
  Administered 2022-06-07: 100 mg via INTRAVENOUS

## 2022-06-07 MED ORDER — PHENYLEPHRINE 80 MCG/ML (10ML) SYRINGE FOR IV PUSH (FOR BLOOD PRESSURE SUPPORT)
PREFILLED_SYRINGE | INTRAVENOUS | Status: DC | PRN
  Administered 2022-06-07 (×7): 80 ug via INTRAVENOUS

## 2022-06-07 MED ORDER — DEXAMETHASONE SODIUM PHOSPHATE 10 MG/ML IJ SOLN
INTRAMUSCULAR | Status: AC
  Filled 2022-06-07: qty 1

## 2022-06-07 MED ORDER — BUPIVACAINE HCL (PF) 0.5 % IJ SOLN
INTRAMUSCULAR | Status: AC
  Filled 2022-06-07: qty 30

## 2022-06-07 MED ORDER — MIDAZOLAM HCL 2 MG/2ML IJ SOLN
INTRAMUSCULAR | Status: AC
  Filled 2022-06-07: qty 2

## 2022-06-07 MED ORDER — SCOPOLAMINE 1 MG/3DAYS TD PT72
1.0000 | MEDICATED_PATCH | TRANSDERMAL | Status: DC
  Administered 2022-06-07: 1.5 mg via TRANSDERMAL
  Filled 2022-06-07: qty 1

## 2022-06-07 MED ORDER — AMISULPRIDE (ANTIEMETIC) 5 MG/2ML IV SOLN: 10.0000 mg | Freq: Once | INTRAVENOUS | Status: DC | PRN

## 2022-06-07 MED ORDER — PROMETHAZINE HCL 25 MG/ML IJ SOLN: 6.2500 mg | INTRAMUSCULAR | Status: DC | PRN

## 2022-06-07 MED ORDER — FENTANYL CITRATE (PF) 250 MCG/5ML IJ SOLN
INTRAMUSCULAR | Status: DC | PRN
  Administered 2022-06-07: 50 ug via INTRAVENOUS

## 2022-06-07 MED ORDER — ONDANSETRON HCL 4 MG/2ML IJ SOLN
INTRAMUSCULAR | Status: DC | PRN
  Administered 2022-06-07: 4 mg via INTRAVENOUS

## 2022-06-07 MED ORDER — FENTANYL CITRATE (PF) 100 MCG/2ML IJ SOLN: 25.0000 ug | INTRAMUSCULAR | Status: DC | PRN

## 2022-06-07 MED ORDER — PROPOFOL 10 MG/ML IV BOLUS
INTRAVENOUS | Status: AC
  Filled 2022-06-07: qty 20

## 2022-06-07 MED ORDER — ONDANSETRON HCL 4 MG/2ML IJ SOLN
INTRAMUSCULAR | Status: AC
  Filled 2022-06-07: qty 2

## 2022-06-07 MED ORDER — BUPIVACAINE HCL 0.5 % IJ SOLN
INTRAMUSCULAR | Status: DC | PRN
  Administered 2022-06-07: 30 mL

## 2022-06-07 MED ORDER — FENTANYL CITRATE (PF) 250 MCG/5ML IJ SOLN
INTRAMUSCULAR | Status: AC
  Filled 2022-06-07: qty 5

## 2022-06-07 MED ORDER — PHENYLEPHRINE 80 MCG/ML (10ML) SYRINGE FOR IV PUSH (FOR BLOOD PRESSURE SUPPORT)
PREFILLED_SYRINGE | INTRAVENOUS | Status: AC
  Filled 2022-06-07: qty 10

## 2022-06-07 MED ORDER — ACETAMINOPHEN 500 MG PO TABS
1000.0000 mg | ORAL_TABLET | ORAL | Status: AC
  Filled 2022-06-07: qty 2

## 2022-06-07 MED ORDER — OXYCODONE HCL 5 MG PO TABS: 5.0000 mg | ORAL_TABLET | Freq: Four times a day (QID) | ORAL | 0 refills | Status: DC | PRN

## 2022-06-07 MED ORDER — MIDAZOLAM HCL 2 MG/2ML IJ SOLN
INTRAMUSCULAR | Status: DC | PRN
  Administered 2022-06-07: 2 mg via INTRAVENOUS

## 2022-06-07 MED ORDER — PROPOFOL 10 MG/ML IV BOLUS
INTRAVENOUS | Status: DC | PRN
  Administered 2022-06-07: 200 mg via INTRAVENOUS

## 2022-06-07 MED ORDER — DEXAMETHASONE SODIUM PHOSPHATE 10 MG/ML IJ SOLN
INTRAMUSCULAR | Status: DC | PRN
  Administered 2022-06-07: 5 mg via INTRAVENOUS

## 2022-06-07 MED ORDER — CELECOXIB 200 MG PO CAPS
400.0000 mg | ORAL_CAPSULE | ORAL | Status: AC
  Administered 2022-06-07: 400 mg via ORAL
  Filled 2022-06-07: qty 2

## 2022-06-07 MED ORDER — CHLORHEXIDINE GLUCONATE 0.12 % MT SOLN
OROMUCOSAL | Status: AC
  Administered 2022-06-07: 15 mL
  Filled 2022-06-07: qty 15

## 2022-06-07 MED ORDER — ACETAMINOPHEN 500 MG PO TABS
1000.0000 mg | ORAL_TABLET | Freq: Once | ORAL | Status: AC
  Administered 2022-06-07: 1000 mg via ORAL

## 2022-06-07 MED ORDER — LACTATED RINGERS IV SOLN: INTRAVENOUS | Status: DC

## 2022-06-07 MED ORDER — IBUPROFEN 800 MG PO TABS: 800.0000 mg | ORAL_TABLET | Freq: Three times a day (TID) | ORAL | 2 refills | Status: DC | PRN

## 2022-06-07 MED ORDER — DOXYCYCLINE HYCLATE 100 MG IV SOLR
200.0000 mg | INTRAVENOUS | Status: AC
  Administered 2022-06-07: 200 mg via INTRAVENOUS
  Filled 2022-06-07: qty 200

## 2022-06-07 SURGICAL SUPPLY — 22 items
CATH ROBINSON RED A/P 16FR (CATHETERS) ×2 IMPLANT
FILTER UTR ASPR ASSEMBLY (MISCELLANEOUS) ×2 IMPLANT
GAUZE 4X4 16PLY ~~LOC~~+RFID DBL (SPONGE) IMPLANT
GLOVE BIOGEL PI IND STRL 7.0 (GLOVE) ×2 IMPLANT
GLOVE ECLIPSE 7.0 STRL STRAW (GLOVE) ×2 IMPLANT
GOWN STRL REUS W/ TWL LRG LVL3 (GOWN DISPOSABLE) ×4 IMPLANT
GOWN STRL REUS W/TWL LRG LVL3 (GOWN DISPOSABLE) ×4
HOSE CONNECTING 18IN BERKELEY (TUBING) ×2 IMPLANT
KIT BERKELEY 1ST TRI 3/8 NO TR (MISCELLANEOUS) ×2 IMPLANT
KIT BERKELEY 1ST TRIMESTER 3/8 (MISCELLANEOUS) ×2 IMPLANT
NS IRRIG 1000ML POUR BTL (IV SOLUTION) ×2 IMPLANT
PACK VAGINAL MINOR WOMEN LF (CUSTOM PROCEDURE TRAY) ×2 IMPLANT
PAD OB MATERNITY 4.3X12.25 (PERSONAL CARE ITEMS) ×2 IMPLANT
SET BERKELEY SUCTION TUBING (SUCTIONS) ×2 IMPLANT
SPIKE FLUID TRANSFER (MISCELLANEOUS) ×2 IMPLANT
TOWEL GREEN STERILE FF (TOWEL DISPOSABLE) ×4 IMPLANT
UNDERPAD 30X36 HEAVY ABSORB (UNDERPADS AND DIAPERS) ×2 IMPLANT
VACURETTE 10 RIGID CVD (CANNULA) IMPLANT
VACURETTE 6 ASPIR F TIP BERK (CANNULA) IMPLANT
VACURETTE 7MM CVD STRL WRAP (CANNULA) IMPLANT
VACURETTE 8 RIGID CVD (CANNULA) IMPLANT
VACURETTE 9 RIGID CVD (CANNULA) IMPLANT

## 2022-06-07 NOTE — Op Note (Signed)
Masyn Matte PROCEDURE DATE:  06/07/2022  PREOPERATIVE DIAGNOSIS:Retained products of conception after previous procedure done for missed abortion POSTOPERATIVE DIAGNOSIS: The same PROCEDURE:  Dilation and Evacuation under ultrasound guidance SURGEON:  Dr. Verita Schneiders  INDICATIONS: 32 y.o. [id number] with retained products of conception after previous procedure done for missed abortion, here for surgical management.  Risks of surgery were discussed with the patient including but not limited to: bleeding which may require transfusion; infection which may require antibiotics; injury to uterus or surrounding organs; need for additional procedures including laparotomy or laparoscopy; possibility of intrauterine scarring which may impair future fertility; and other postoperative/anesthesia complications. Written informed consent was obtained.  FINDINGS:   Significant amount of products of conception.  Empty endometrial stripe noted on ultrasound at the end of the procedure.   ANESTHESIA:    General, paracervical block with 30 ml of 0.5% Marcaine ESTIMATED BLOOD LOSS:  50 ml. SPECIMENS:  Products of conception sent to pathology COMPLICATIONS:  None immediate.  PROCEDURE DETAILS:  The patient received intravenous Doxycycline while in the preoperative area.  She was then taken to the operating room where anesthesia  was administered and was found to be adequate.  After an adequate timeout was performed, she was placed in the dorsal lithotomy position and examined; then prepped and draped in the sterile manner.   Her bladder was catheterized for an unmeasured amount of clear, yellow urine. A vaginal speculum was then placed in the patient's vagina and a single tooth tenaculum was applied to the anterior lip of the cervix.  A paracervical block using 30 ml of 0.5% Marcaine was administered. The cervix was gently dilated under ultrasound guidance to accommodate a 7 mm suction curette that was gently advanced to  the uterine fundus.  The suction device was then activated and curette slowly rotated to clear the uterus of products of conception. There was an empty endometrial stripe noted on the ultrasound at the end of the curettage.  There was some bleeding noted from tenaculum site on cervix, had to ameliorate with figure-of-eight 3-0 Vicryl stitch.  Minimal bleeding noted at the end of the procedure, and the tenaculum removed with good hemostasis noted.   All instruments were removed from the patient's vagina.  Sponge and instrument counts were correct times three.    The patient tolerated the procedure well and was taken to the recovery area awake, extubated and in stable condition.  The patient will be discharged to home as per PACU criteria.  Routine postoperative instructions given.  She was prescribed Oxycodone and Ibuprofen.  She will follow up in the office as scheduled for postoperative evaluation.   Verita Schneiders, MD, [place] for [employer], [place]

## 2022-06-07 NOTE — H&P (Signed)
Preoperative History and Physical  Brittany Quinn is a 32 y.o. [id number] here for surgical management of retained products of conception after previous procedure done on 05/17/2022 for miscarriage.   No significant preoperative concerns.  Proposed surgery: Dilation and Evacuation under ultrasound guidance.  Past Medical History:  Diagnosis Date   Pre-diabetes    Trichomonas    Past Surgical History:  Procedure Laterality Date   CESAREAN SECTION N/A 05/27/2019   Procedure: CESAREAN SECTION;  Surgeon: Gwynne Edinger, MD;  Location: [facility];  Service: Obstetrics;  Laterality: N/A;   DILATION AND EVACUATION N/A 05/17/2022   Procedure: DILATATION AND EVACUATION;  Surgeon: [name], MD;  Location: [facility];  Service: Gynecology;  Laterality: N/A;   LAPAROSCOPY  06/23/2011   Procedure: LAPAROSCOPY OPERATIVE;  Surgeon: Osborne Oman, MD;  Location: [facility];  Service: Gynecology;  Laterality: Left;  Diagnostic laparoscopy   LAPAROTOMY  06/23/2011   Procedure: LAPAROTOMY;  Surgeon: Osborne Oman, MD;  Location: [facility];  Service: Gynecology;  Laterality: Left;  Wedge resection of Ruptured left cornual ectopic pregnancy with left salpingectomy   OB History  Gravida Para Term Preterm AB Living  5 1 1   4 1  $ SAB IAB Ectopic Multiple Live Births  2 1 1 $ 0 1    # Outcome Date GA Lbr Len/2nd Weight Sex Delivery Anes PTL Lv  5 SAB 05/17/22 [redacted]w[redacted]d        4 IAB 05/2020          3 Term 05/27/19 [id number] g M CS-LTranv Spinal  LIV  2 Ectopic              Birth Comments: System Generated. Please review and update pregnancy details.  1 SAB           Patient denies any other pertinent gynecologic issues.   No current facility-administered medications on file prior to encounter.   Current Outpatient Medications on File Prior to Encounter  Medication Sig Dispense Refill   cetirizine (ZYRTEC) 10 MG tablet Take 10 mg by mouth daily as needed for allergies.     ferrous sulfate 325 (65 FE) MG  tablet Take 1 tablet (325 mg total) by mouth every other day for 30 doses. 30 tablet 0   ibuprofen (ADVIL) 800 MG tablet Take 1 tablet (800 mg total) by mouth every 8 (eight) hours as needed. 30 tablet 0   amoxicillin-clavulanate (AUGMENTIN) 875-125 MG tablet Take 1 tablet by mouth every 12 (twelve) hours for 14 days. (Patient not taking: Reported on 06/06/2022) 28 tablet 0   No Known Allergies  Social History:   reports that she quit smoking about 3 years ago. Her smoking use included cigars. She has never used smokeless tobacco. She reports that she does not currently use alcohol. She reports that she does not currently use drugs after having used the following drugs: Marijuana.  Family History  Problem Relation Age of Onset   Breast cancer Mother        around 6232 Cancer Father        prostate   Diabetes Father    Breast cancer Maternal Aunt    Breast cancer Cousin    Breast cancer Maternal Aunt     Review of Systems: Pertinent items noted in HPI and remainder of comprehensive ROS otherwise negative.  PHYSICAL EXAM: Blood pressure 130/82, pulse 72, temperature 98.7 F (37.1 C), temperature source Oral, resp. rate 16, height  $5' 3"i$  (1.6 m), weight 100.7 kg, last menstrual period 02/01/2022, SpO2 100 %, unknown if currently breastfeeding. CONSTITUTIONAL: Well-developed, well-nourished female in no acute distress.  HENT:  Normocephalic, atraumatic, External right and left ear normal. Oropharynx is clear and moist EYES: Conjunctivae and EOM are normal. Pupils are equal, round, and reactive to light. No scleral icterus.  NECK: Normal range of motion, supple, no masses SKIN: Skin is warm and dry. No rash noted. Not diaphoretic. No erythema. No pallor. NEUROLOGIC: Alert and oriented to person, place, and time. Normal reflexes, muscle tone coordination. No cranial nerve deficit noted. PSYCHIATRIC: Normal mood and affect. Normal behavior. Normal judgment and thought content. CARDIOVASCULAR:  Normal heart rate noted, regular rhythm RESPIRATORY: Effort and breath sounds normal, no problems with respiration noted ABDOMEN: Soft, nontender, nondistended. PELVIC: Deferred MUSCULOSKELETAL: Normal range of motion. No edema and no tenderness. 2+ distal pulses.  Labs: Results for orders placed or performed during the hospital encounter of 06/07/22 (from the past 336 hour(s))  Type and screen [place] [facility]   Collection Time: 06/07/22  9:50 AM  Result Value Ref Range   ABO/RH(D) B POS    Antibody Screen NEG    Sample Expiration      06/10/2022,2359 Performed at [facility], [place] [address], [place], [place] [address]   CBC   Collection Time: 06/07/22 10:06 AM  Result Value Ref Range   WBC 6.0 4.0 - 10.5 K/uL   RBC 3.38 (L) 3.87 - 5.11 MIL/uL   Hemoglobin 10.1 (L) 12.0 - 15.0 g/dL   HCT 32.7 (L) 36.0 - 46.0 %   MCV 96.7 80.0 - 100.0 fL   MCH 29.9 26.0 - 34.0 pg   MCHC 30.9 30.0 - 36.0 g/dL   RDW 12.7 11.5 - 15.5 %   Platelets 304 150 - 400 K/uL   nRBC 0.0 0.0 - 0.2 %  Results for orders placed or performed in visit on 06/04/22 (from the past 336 hour(s))  Beta hCG quant (ref lab)   Collection Time: 06/04/22 10:40 AM  Result Value Ref Range   hCG Quant 19 mIU/mL  Results for orders placed or performed in visit on 05/30/22 (from the past 336 hour(s))  Urine Culture   Collection Time: 05/30/22 10:46 AM   Specimen: Urine, Clean Catch   UR  Result Value Ref Range   Urine Culture, Routine Final report (A)    Organism ID, Bacteria Escherichia coli (A)    Antimicrobial Susceptibility Comment   Results for orders placed or performed during the hospital encounter of 05/24/22 (from the past 336 hour(s))  Urinalysis, Routine w reflex microscopic -Urine, Clean Catch   Collection Time: 05/24/22  4:40 PM  Result Value Ref Range   Color, Urine YELLOW YELLOW   APPearance HAZY (A) CLEAR   Specific Gravity, Urine 1.018 1.005 - 1.030   pH 5.0 5.0 - 8.0   Glucose,  UA NEGATIVE NEGATIVE mg/dL   Hgb urine dipstick LARGE (A) NEGATIVE   Bilirubin Urine NEGATIVE NEGATIVE   Ketones, ur NEGATIVE NEGATIVE mg/dL   Protein, ur NEGATIVE NEGATIVE mg/dL   Nitrite NEGATIVE NEGATIVE   Leukocytes,Ua LARGE (A) NEGATIVE   RBC / HPF >50 0 - 5 RBC/hpf   WBC, UA 6-10 0 - 5 WBC/hpf   Bacteria, UA RARE (A) NONE SEEN   Squamous Epithelial / HPF 0-5 0 - 5 /HPF   Mucus PRESENT   GC/Chlamydia probe amp ([facility])not at [facility]   Collection Time: 05/24/22  4:40 PM  Result Value Ref Range   Neisseria Gonorrhea Negative    Chlamydia Negative    Comment Normal Reference [place] Chlamydia - Negative    Comment      Normal Reference Range Neisseria Gonorrhea - Negative  Comprehensive metabolic panel   Collection Time: 05/24/22  9:16 PM  Result Value Ref Range   Sodium 136 135 - 145 mmol/L   Potassium 3.4 (L) 3.5 - 5.1 mmol/L   Chloride 104 98 - 111 mmol/L   CO2 24 22 - 32 mmol/L   Glucose, Bld 102 (H) 70 - 99 mg/dL   BUN <5 (L) 6 - 20 mg/dL   Creatinine, Ser 0.67 0.44 - 1.00 mg/dL   Calcium 8.8 (L) 8.9 - 10.3 mg/dL   Total Protein 6.3 (L) 6.5 - 8.1 g/dL   Albumin 3.4 (L) 3.5 - 5.0 g/dL   AST 19 15 - 41 U/L   ALT 21 0 - 44 U/L   Alkaline Phosphatase 46 38 - 126 U/L   Total Bilirubin 1.0 0.3 - 1.2 mg/dL   GFR, Estimated >60 >60 mL/min   Anion gap 8 5 - 15  CBC with Differential   Collection Time: 05/24/22  9:16 PM  Result Value Ref Range   WBC 10.4 4.0 - 10.5 K/uL   RBC 2.84 (L) 3.87 - 5.11 MIL/uL   Hemoglobin 9.0 (L) 12.0 - 15.0 g/dL   HCT 27.5 (L) 36.0 - 46.0 %   MCV 96.8 80.0 - 100.0 fL   MCH 31.7 26.0 - 34.0 pg   MCHC 32.7 30.0 - 36.0 g/dL   RDW 13.1 11.5 - 15.5 %   Platelets 307 150 - 400 K/uL   nRBC 0.2 0.0 - 0.2 %   Neutrophils Relative % 60 %   Neutro Abs 6.2 1.7 - 7.7 K/uL   Lymphocytes Relative 33 %   Lymphs Abs 3.5 0.7 - 4.0 K/uL   Monocytes Relative 6 %   Monocytes Absolute 0.7 0.1 - 1.0 K/uL   Eosinophils Relative 1 %   Eosinophils Absolute  0.1 0.0 - 0.5 K/uL   Basophils Relative 0 %   Basophils Absolute 0.0 0.0 - 0.1 K/uL   Immature Granulocytes 0 %   Abs Immature Granulocytes 0.04 0.00 - 0.07 K/uL  hCG, quantitative, pregnancy   Collection Time: 05/24/22  9:16 PM  Result Value Ref Range   hCG, Beta Chain, Quant, S 328 (H) <5 mIU/mL  Wet prep, genital   Collection Time: 05/24/22  9:58 PM   Specimen: Vaginal  Result Value Ref Range   Yeast Wet Prep HPF POC NONE SEEN NONE SEEN   Trich, Wet Prep NONE SEEN NONE SEEN   Clue Cells Wet Prep HPF POC NONE SEEN NONE SEEN   WBC, Wet Prep HPF POC <10 <10   Sperm NONE SEEN     Imaging Studies: US PELVIC COMPLETE WITH TRANSVAGINAL  Result Date: 06/04/2022 CLINICAL DATA:  Persistent vaginal bleeding status post D and C for retained products of conception EXAM: ULTRASOUND OF PELVIS TECHNIQUE: Transabdominal and transvaginalultrasound examination of the pelvis was performed including evaluation of the uterus, ovaries, adnexal regions, and pelvic cul-de-sac. COMPARISON:  05/24/2022 FINDINGS: Uterusanteverted, 9 x 8 x 5 cm. Uterine cavity filled with heterogeneous complex fluid and debris which is less solid-appearing than on the prior study, but there continues to be suggestion of possible retained products of conception. The appearance could also be related to the presence of clot. Correlation with quantitative HCG measurements recommended.  No myometrial lesions. Right ovary Unremarkable, 3.2 x 2.3 x 2.0 cm. Left ovary Unremarkable, 2.8 x 1.9 x 1.6 cm. Images of the adnexae demonstrated no masses or fluid collections IMPRESSION: Uterine cavity remains distended with heterogeneous clot and debris and/or residual retained products of conception. Tissue diagnosis recommended. No adnexal pathology. Electronically Signed   By: Sammie Bench M.D.   On: 06/04/2022 11:02   US PELVIC COMPLETE W TRANSVAGINAL AND TORSION R/O  Result Date: 05/24/2022 CLINICAL DATA:  Pelvic pain. EXAM: TRANSABDOMINAL  AND TRANSVAGINAL ULTRASOUND OF PELVIS DOPPLER ULTRASOUND OF OVARIES TECHNIQUE: Both transabdominal and transvaginal ultrasound examinations of the pelvis were performed. Transabdominal technique was performed for global imaging of the pelvis including uterus, ovaries, adnexal regions, and pelvic cul-de-sac. It was necessary to proceed with endovaginal exam following the transabdominal exam to visualize the endometrium and ovaries. Color and duplex Doppler ultrasound was utilized to evaluate blood flow to the ovaries. COMPARISON:  None Available. FINDINGS: Uterus Measurements: 11.4 x 6.4 x 8.7 cm = volume: 331 mL. No fibroids or other mass visualized. Endometrium Thickness: 5 cm. The endometrium is thickened and heterogeneous. Findings may represent endometritis, endometrial hyperplasia, neoplasm, polyp or related to hormone replacement therapy. Other etiologies are not excluded. Right ovary Measurements: 2.3 x 1.2 x 2.6 cm = volume: 3.8 mL. Normal appearance/no adnexal mass. Left ovary Measurements: 2.7 x 1.2 x 2.1 cm = volume: 3.7 mL. Normal appearance/no adnexal mass. Pulsed Doppler evaluation of both ovaries demonstrates normal low-resistance arterial and venous waveforms. Other findings No abnormal free fluid. IMPRESSION: 1. Thickened and heterogeneous endometrium of indeterminate etiology. Clinical correlation recommended. Hysteroscopy may provide better evaluation if clinically indicated. 2. Unremarkable ovaries. Electronically Signed   By: Anner Crete M.D.   On: 05/24/2022 21:24    Assessment: Principal Problem:   Retained products of conception after procedure done for miscarriage   Plan: Patient will undergo surgical management with Dilation and Evacuation under ultrasound guidance.   The risks of surgery were discussed in detail with the patient including but not limited to: bleeding, infection, injury to surrounding organs, need for additional procedures, possibility of intrauterine scarring  which may impair future fertility, risk of retained products which may require further management and other postoperative/anesthesia complications were explained to patient.  Likelihood of success of complete evacuation of the uterus was discussed with the patient.  Written informed consent was obtained.  Patient has been NPO since last night  she will remain NPO for procedure. Anesthesia and OR aware.  Preoperative prophylactic Doxycycline 266m IV  has been ordered and is on call to the OR.  To OR when ready.     UVerita Schneiders MD, FCarsonfor [employer] CEwing

## 2022-06-07 NOTE — Transfer of Care (Signed)
Immediate Anesthesia Transfer of Care Note  Patient: Brittany Quinn  Procedure(s) Performed: DILATATION AND EVACUATION (Vagina ) OPERATIVE ULTRASOUND (Abdomen)  Patient Location: PACU  Anesthesia Type:General  Level of Consciousness: awake, alert , oriented, and patient cooperative  Airway & Oxygen Therapy: Patient Spontanous Breathing and Patient connected to nasal cannula oxygen  Post-op Assessment: Report given to RN, Post -op Vital signs reviewed and stable, and Patient moving all extremities X 4  Post vital signs: Reviewed and stable  Last Vitals:  Vitals Value Taken Time  BP 112/67 06/07/22 1337  Temp 36.6 C 06/07/22 1337  Pulse 77 06/07/22 1340  Resp 16 06/07/22 1340  SpO2 100 % 06/07/22 1340  Vitals shown include unvalidated device data.  Last Pain:  Vitals:   06/07/22 0936  TempSrc:   PainSc: 0-No pain         Complications: No notable events documented.

## 2022-06-07 NOTE — Anesthesia Procedure Notes (Addendum)
Procedure Name: LMA Insertion Date/Time: 06/07/2022 12:48 PM  Performed by: Michele Rockers, CRNAPre-anesthesia Checklist: Patient identified, Emergency Drugs available, Suction available and Patient being monitored Patient Re-evaluated:Patient Re-evaluated prior to induction Oxygen Delivery Method: Circle system utilized Preoxygenation: Pre-oxygenation with 100% oxygen Induction Type: IV induction LMA: LMA flexible inserted LMA Size: 4.0 Number of attempts: 1 Placement Confirmation: positive ETCO2 and breath sounds checked- equal and bilateral Tube secured with: Tape Dental Injury: Teeth and Oropharynx as per pre-operative assessment

## 2022-06-07 NOTE — Progress Notes (Signed)
Pt complaining of burning on left medial forearm after Doxycycline IV started. Rate slowed to 75cc/hour and pt given hot pack to apply to arm for comfort. IV site intact;no redness, no red streak; no swelling noted. Rechecked pt and she said she was still having some burning. IV site still without complication. Rate slowed to 60cc's . Told pt to call me if burning got worse. Went back to check on patient and she reports that burning is better. She is still having some burning. IV site is intact,no redness, no swelling. Consulted with Lattie Haw, pharmacist in main pharmacy, who advised that slowing the rate was the best thing to do.

## 2022-06-07 NOTE — Anesthesia Postprocedure Evaluation (Signed)
Anesthesia Post Note  Patient: Brittany Quinn  Procedure(s) Performed: DILATATION AND EVACUATION (Vagina ) OPERATIVE ULTRASOUND (Abdomen)     Patient location during evaluation: PACU Anesthesia Type: General Level of consciousness: sedated Pain management: pain level controlled Vital Signs Assessment: post-procedure vital signs reviewed and stable Respiratory status: spontaneous breathing and respiratory function stable Cardiovascular status: stable Postop Assessment: no apparent nausea or vomiting Anesthetic complications: no   No notable events documented.  Last Vitals:  Vitals:   06/07/22 1400 06/07/22 1415  BP: 129/85 124/83  Pulse: 66 67  Resp: 15 17  Temp:  36.6 C  SpO2: 100% 100%    Last Pain:  Vitals:   06/07/22 1415  TempSrc:   PainSc: 0-No pain                 Suzanna Zahn DANIEL

## 2022-06-07 NOTE — Anesthesia Preprocedure Evaluation (Addendum)
Anesthesia Evaluation  Patient identified by MRN, date of birth, ID band Patient awake    Reviewed: Allergy & Precautions, NPO status , Patient's Chart, lab work & pertinent test results  History of Anesthesia Complications Negative for: history of anesthetic complications  Airway Mallampati: III  TM Distance: >3 FB Neck ROM: Full    Dental  (+) Missing, Dental Advisory Given,    Pulmonary former smoker   Pulmonary exam normal        Cardiovascular negative cardio ROS Normal cardiovascular exam     Neuro/Psych negative neurological ROS  negative psych ROS   GI/Hepatic negative GI ROS, Neg liver ROS,,,  Endo/Other    Morbid obesityBMI 10  Renal/GU negative Renal ROS  negative genitourinary   Musculoskeletal negative musculoskeletal ROS (+)    Abdominal   Peds  Hematology negative hematology ROS (+)   Anesthesia Other Findings Day of surgery medications reviewed with patient.  Reproductive/Obstetrics Missed Ab                             Anesthesia Physical Anesthesia Plan  ASA: 3  Anesthesia Plan: General   Post-op Pain Management: Tylenol PO (pre-op)* and Toradol IV (intra-op)*   Induction: Intravenous  PONV Risk Score and Plan: 3 and Treatment may vary due to age or medical condition, Midazolam, Scopolamine patch - Pre-op, Dexamethasone and Ondansetron  Airway Management Planned: LMA  Additional Equipment: None  Intra-op Plan:   Post-operative Plan: Extubation in OR  Informed Consent: I have reviewed the patients History and Physical, chart, labs and discussed the procedure including the risks, benefits and alternatives for the proposed anesthesia with the patient or authorized representative who has indicated his/her understanding and acceptance.     Dental advisory given  Plan Discussed with: Anesthesiologist and CRNA  Anesthesia Plan Comments:         Anesthesia Quick Evaluation

## 2022-06-07 NOTE — Progress Notes (Signed)
Reassessed pt's IV site . Site with WNL. Pt reports that the burning has decreased since rate has slowed.

## 2022-06-08 ENCOUNTER — Ambulatory Visit: Payer: No Typology Code available for payment source | Admitting: Obstetrics and Gynecology

## 2022-06-08 ENCOUNTER — Encounter (HOSPITAL_COMMUNITY): Payer: Self-pay | Admitting: Obstetrics & Gynecology

## 2022-06-08 ENCOUNTER — Encounter: Payer: Self-pay | Admitting: Obstetrics and Gynecology

## 2022-06-08 LAB — SURGICAL PATHOLOGY

## 2022-06-15 ENCOUNTER — Encounter: Payer: Self-pay | Admitting: Certified Nurse Midwife

## 2022-06-15 ENCOUNTER — Ambulatory Visit: Payer: No Typology Code available for payment source | Admitting: Certified Nurse Midwife

## 2022-06-15 ENCOUNTER — Other Ambulatory Visit: Payer: Self-pay

## 2022-06-15 VITALS — BP 114/63 | HR 75 | Wt 221.3 lb

## 2022-06-15 DIAGNOSIS — Z9889 Other specified postprocedural states: Secondary | ICD-10-CM

## 2022-06-15 DIAGNOSIS — O039 Complete or unspecified spontaneous abortion without complication: Secondary | ICD-10-CM

## 2022-06-15 NOTE — Progress Notes (Signed)
   Subjective:   Patient Name: Brittany Quinn, female   DOB: 03/11/1991, 32 y.o.  MRN: [id number]  HPI Seen for follow up of SAB 4 weeks ago. Had a D&E two days after diagnosis of miscarriage, then developed endometritis and the D&E had to be repeated. Expressed gratitude for the 2nd MD who performed the D&E. Denies bleeding, cramping. Menses has not returned yet. Does not plan on becoming pregnant soon.    Review of Systems Pertinent items noted in HPI and remainder of comprehensive ROS otherwise negative.     Objective:  Constitutional: Well-developed, well-nourished female in no acute distress.  HEENT: PERRLA Skin: normal color and turgor, no rash Cardiovascular: normal rate & rhythm, warm and well perfused Respiratory: normal effort, no problems with respiration noted GI: Abd soft, non-tender MS: Extremities nontender, no edema, normal ROM Neurologic: Alert and oriented x 4.  Pelvic: exam deferred0    Assessment & Plan:  1. History of dilation and curettage - Declines birth control, pap up to date - Expressed huge gratitude for Dr. Harolyn Rutherford (who was in the office), so requested she come to the room so pt could expressed it directly.   Will return at end of the year for pap.  Gabriel Carina MSN, CNM, IBCLC 06/15/22  5:20 PM

## 2022-07-11 ENCOUNTER — Other Ambulatory Visit: Payer: No Typology Code available for payment source

## 2022-07-11 ENCOUNTER — Ambulatory Visit: Payer: No Typology Code available for payment source

## 2022-08-13 ENCOUNTER — Ambulatory Visit
Admission: EM | Admit: 2022-08-13 | Discharge: 2022-08-13 | Disposition: A | Discharge status: Home / Self Care | Payer: No Typology Code available for payment source | Attending: Nurse Practitioner | Admitting: Nurse Practitioner

## 2022-08-13 ENCOUNTER — Encounter: Payer: Self-pay | Admitting: Emergency Medicine

## 2022-08-13 DIAGNOSIS — J0100 Acute maxillary sinusitis, unspecified: Secondary | ICD-10-CM

## 2022-08-13 MED ORDER — AMOXICILLIN-POT CLAVULANATE 875-125 MG PO TABS: 1.0000 | ORAL_TABLET | Freq: Two times a day (BID) | ORAL | 0 refills | Status: AC

## 2022-08-13 NOTE — ED Triage Notes (Signed)
For over week having cough, congestion, ear pain. Taking Sudafed, Benadryl, Cetirizine without relief.

## 2022-08-13 NOTE — ED Provider Notes (Signed)
[facility]    CSN: [id number] Arrival date & time: 08/13/22  1715      History   Chief Complaint Chief Complaint  Patient presents with   Cough   Otalgia    HPI Brittany Quinn is a 32 y.o. female  presents for evaluation of URI symptoms for 2 weeks. Patient reports associated symptoms of sinus pressure/pain, ear pain, postnasal drip with cough. Denies N/V/D, fevers, sore throat, body aches, shortness of breath. Patient does not have a hx of asthma.  She is a previous smoker.  No known sick contacts.  Pt has taken Sudafed and allergy medicine OTC for symptoms. Pt has no other concerns at this time.    Cough Associated symptoms: ear pain   Otalgia Associated symptoms: congestion and cough     Past Medical History:  Diagnosis Date   Pre-diabetes    Retained products of conception after procedure done for miscarriage 06/05/2022   Trichomonas     Patient Active Problem List   Diagnosis Date Noted   Obesity 03/22/2020   Former smoker 12/17/2018    Past Surgical History:  Procedure Laterality Date   CESAREAN SECTION N/A 05/27/2019   Procedure: CESAREAN SECTION;  Surgeon: Kathrynn Running, MD;  Location: [facility];  Service: Obstetrics;  Laterality: N/A;   DILATION AND EVACUATION N/A 05/17/2022   Procedure: DILATATION AND EVACUATION;  Surgeon: Hermina Staggers, MD;  Location: [facility];  Service: Gynecology;  Laterality: N/A;   DILATION AND EVACUATION N/A 06/07/2022   Procedure: DILATATION AND EVACUATION;  Surgeon: Tereso Newcomer, MD;  Location: [facility];  Service: Gynecology;  Laterality: N/A;   LAPAROSCOPY  06/23/2011   Procedure: LAPAROSCOPY OPERATIVE;  Surgeon: Tereso Newcomer, MD;  Location: [facility];  Service: Gynecology;  Laterality: Left;  Diagnostic laparoscopy   LAPAROTOMY  06/23/2011   Procedure: LAPAROTOMY;  Surgeon: Tereso Newcomer, MD;  Location: [facility];  Service: Gynecology;  Laterality: Left;  Wedge resection of Ruptured left cornual ectopic pregnancy with  left salpingectomy   OPERATIVE ULTRASOUND N/A 06/07/2022   Procedure: OPERATIVE ULTRASOUND;  Surgeon: Tereso Newcomer, MD;  Location: [facility];  Service: Gynecology;  Laterality: N/A;    OB History     Gravida  5   Para  1   Term  1   Preterm      AB  4   Living  1      SAB  2   IAB  1   Ectopic  1   Multiple  0   Live Births  1            Home Medications    Prior to Admission medications   Medication Sig Start Date End Date Taking? Authorizing Provider  amoxicillin-clavulanate (AUGMENTIN) 875-125 MG tablet Take 1 tablet by mouth every 12 (twelve) hours for 10 days. 08/13/22 08/23/22 Yes Radford Pax, NP  cetirizine (ZYRTEC) 10 MG tablet Take 10 mg by mouth daily as needed for allergies.    [provider]  ferrous sulfate 325 (65 FE) MG tablet Take 1 tablet (325 mg total) by mouth every other day for 30 doses. 05/24/22 07/22/22  Alphia Kava, MD    Family History Family History  Problem Relation Age of Onset   Breast cancer Mother        around 17   Cancer Father        prostate   Diabetes Father    Breast cancer Maternal Aunt  Breast cancer Cousin    Breast cancer Maternal Aunt     Social History Social History   Tobacco Use   Smoking status: Former    Types: Cigars    Quit date: 09/01/2018    Years since quitting: 3.9   Smokeless tobacco: Never   Tobacco comments:    Blacks  Vaping Use   Vaping Use: Never used  Substance Use Topics   Alcohol use: Not Currently    Comment: occ   Drug use: Not Currently    Types: Marijuana    Comment: last use May 2020     Allergies   Patient has no known allergies.   Review of Systems Review of Systems  HENT:  Positive for congestion, ear pain, postnasal drip, sinus pressure and sinus pain.   Respiratory:  Positive for cough.      Physical Exam Triage Vital Signs ED Triage Vitals  Enc Vitals Group     BP 08/13/22 1725 111/77     Pulse Rate 08/13/22 1725 83     Resp 08/13/22 1725  18     Temp 08/13/22 1725 98.2 F (36.8 C)     Temp Source 08/13/22 1725 Oral     SpO2 08/13/22 1725 100 %     Weight --      Height --      Head Circumference --      Peak Flow --      Pain Score 08/13/22 1724 6     Pain Loc --      Pain Edu? --      Excl. in GC? --    No data found.  Updated Vital Signs BP 111/77 (BP Location: Right Arm)   Pulse 83   Temp 98.2 F (36.8 C) (Oral)   Resp 18   LMP 08/09/2022   SpO2 100%   Visual Acuity Right Eye Distance:   Left Eye Distance:   Bilateral Distance:    Right Eye Near:   Left Eye Near:    Bilateral Near:     Physical Exam Vitals and nursing note reviewed.  Constitutional:      General: She is not in acute distress.    Appearance: She is well-developed. She is not ill-appearing.  HENT:     Head: Normocephalic and atraumatic.     Right Ear: Ear canal normal. A middle ear effusion is present. Tympanic membrane is not erythematous.     Left Ear: Ear canal normal. A middle ear effusion is present. Tympanic membrane is not erythematous.     Nose: Congestion present.     Right Turbinates: Swollen and pale.     Left Turbinates: Swollen and pale.     Right Sinus: Maxillary sinus tenderness present. No frontal sinus tenderness.     Left Sinus: Maxillary sinus tenderness present. No frontal sinus tenderness.     Mouth/Throat:     Mouth: Mucous membranes are moist.     Pharynx: Oropharynx is clear. Uvula midline. No oropharyngeal exudate or posterior oropharyngeal erythema.     Tonsils: No tonsillar exudate or tonsillar abscesses.  Eyes:     Conjunctiva/sclera: Conjunctivae normal.     Pupils: Pupils are equal, round, and reactive to light.  Cardiovascular:     Rate and Rhythm: Normal rate and regular rhythm.     Heart sounds: Normal heart sounds.  Pulmonary:     Effort: Pulmonary effort is normal.     Breath sounds: Normal breath sounds.  Musculoskeletal:  Cervical back: Normal range of motion and neck supple.   Lymphadenopathy:     Cervical: No cervical adenopathy.  Skin:    General: Skin is warm and dry.  Neurological:     General: No focal deficit present.     Mental Status: She is alert and oriented to person, place, and time.  Psychiatric:        Mood and Affect: Mood normal.        Behavior: Behavior normal.      UC Treatments / Results  Labs (all labs ordered are listed, but only abnormal results are displayed) Labs Reviewed - No data to display  Comprehensive metabolic panel Order: [id number] Status: Final result     Visible to patient: Yes (seen)     Next appt: None   0 Result Notes           Component Ref Range & Units 2 mo ago (05/24/22) 5 mo ago (03/07/22) 3 yr ago (05/27/19) 4 yr ago (10/04/17) 4 yr ago (09/16/17) 6 yr ago (06/12/16) 8 yr ago (03/24/14)  Sodium 135 - 145 mmol/L 136 140 R  139 138 136 141 R  Potassium 3.5 - 5.1 mmol/L 3.4 Low  4.0 R  3.7 3.6 3.8 3.9 R  Chloride 98 - 111 mmol/L 104 100 R  106 R 106 R 106 R 105 R  CO2 22 - 32 mmol/L 24 23 R  27 26 25 24  R  Glucose, Bld 70 - 99 mg/dL 409 High  95  93 R 92 R 95 R 92  Comment: Glucose reference range applies only to samples taken after fasting for at least 8 hours.  BUN 6 - 20 mg/dL <5 Low  8  9 8 7 7  R  Creatinine, Ser 0.44 - 1.00 mg/dL 8.11 9.14 R 7.82 9.56 2.13 0.64 0.64 R  Calcium 8.9 - 10.3 mg/dL 8.8 Low  9.8 R  8.9 8.6 Low  8.6 Low  8.8 R  Total Protein 6.5 - 8.1 g/dL 6.3 Low    8.3 High  7.1 7.1 6.9 R  Albumin 3.5 - 5.0 g/dL 3.4 Low    4.7 3.8 4.1 3.4 Low  R  AST 15 - 41 U/L 19   24 15 16 10  R  ALT 0 - 44 U/L 21   29 R 18 R 19 R 11 R  Alkaline Phosphatase 38 - 126 U/L 46   60 56 56 58 R  Total Bilirubin 0.3 - 1.2 mg/dL 1.0   0.4 0.6 0.5 0.4  GFR, Estimated >60 mL/min >60        Comment: (NOTE) Calculated using the CKD-EPI Creatinine Equation (2021)  Anion gap 5 - 15 8           EKG   Radiology No results found.  Procedures Procedures (including critical care  time)  Medications Ordered in UC Medications - No data to display  Initial Impression / Assessment and Plan / UC Course  I have reviewed the triage vital signs and the nursing notes.  Pertinent labs & imaging results that were available during my care of the patient were reviewed by me and considered in my medical decision making (see chart for details).     Reviewed recent progress notes and labs Discussed sinusitis.  Start Augmentin.  Patient states she does not tolerate nasal sprays Continue allergy medicine and Sudafed Nasal rinses as tolerated PCP follow-up if symptoms do not improve ER precautions reviewed and patient verbalized understanding Final  Clinical Impressions(s) / UC Diagnoses   Final diagnoses:  Acute maxillary sinusitis, recurrence not specified     Discharge Instructions      Start Augmentin twice daily for 10 days Continue your allergy medicine and Sudafed Nasal rinses as tolerated Follow-up with your PCP if your symptoms do not improve Please go to emergency room if you have any worsening symptoms    ED Prescriptions     Medication Sig Dispense Auth. Provider   amoxicillin-clavulanate (AUGMENTIN) 875-125 MG tablet Take 1 tablet by mouth every 12 (twelve) hours for 10 days. 20 tablet Radford Pax, NP      PDMP not reviewed this encounter.   Radford Pax, NP 08/13/22 [phone]

## 2022-08-13 NOTE — Discharge Instructions (Signed)
Start Augmentin twice daily for 10 days Continue your allergy medicine and Sudafed Nasal rinses as tolerated Follow-up with your PCP if your symptoms do not improve Please go to emergency room if you have any worsening symptoms

## 2022-11-13 ENCOUNTER — Other Ambulatory Visit: Payer: Self-pay

## 2022-11-13 DIAGNOSIS — N760 Acute vaginitis: Secondary | ICD-10-CM

## 2022-11-13 MED ORDER — METRONIDAZOLE 500 MG PO TABS: 500.0000 mg | ORAL_TABLET | Freq: Two times a day (BID) | ORAL | 0 refills | Status: DC

## 2022-12-18 ENCOUNTER — Ambulatory Visit (HOSPITAL_COMMUNITY)
Admission: EM | Admit: 2022-12-18 | Discharge: 2022-12-18 | Disposition: A | Discharge status: Home / Self Care | Payer: BC Managed Care – PPO | Attending: Physician Assistant | Admitting: Physician Assistant

## 2022-12-18 ENCOUNTER — Encounter (HOSPITAL_COMMUNITY): Payer: Self-pay

## 2022-12-18 DIAGNOSIS — U071 COVID-19: Secondary | ICD-10-CM | POA: Diagnosis not present

## 2022-12-18 DIAGNOSIS — J069 Acute upper respiratory infection, unspecified: Secondary | ICD-10-CM | POA: Diagnosis not present

## 2022-12-18 MED ORDER — ACETAMINOPHEN 325 MG PO TABS
ORAL_TABLET | ORAL | Status: AC
  Filled 2022-12-18: qty 2

## 2022-12-18 MED ORDER — ACETAMINOPHEN 325 MG PO TABS
650.0000 mg | ORAL_TABLET | Freq: Once | ORAL | Status: AC
  Administered 2022-12-18: 650 mg via ORAL

## 2022-12-18 NOTE — ED Triage Notes (Signed)
Patient c/o headache, fever, a non productive cough, and generalized body aches that started this AM.  Patient states she has not taken any medication for her symptoms.

## 2022-12-18 NOTE — ED Provider Notes (Signed)
[facility]    CSN: [id number] Arrival date & time: 12/18/22  1900      History   Chief Complaint Chief Complaint  Patient presents with   Headache   Generalized Body Aches   Cough   Fever    HPI Brittany Quinn is a 32 y.o. female.   Patient here today for evaluation of headache, sore throat, fever, and nonproductive cough that started today.  She has had bodyaches as well.  She has not taken any medication for her symptoms.  She denies any vomiting or diarrhea.  The history is provided by the patient.  Headache Associated symptoms: congestion, cough, fever, myalgias and sore throat   Associated symptoms: no abdominal pain, no diarrhea, no ear pain, no nausea and no vomiting   Cough Associated symptoms: chills, fever, headaches, myalgias and sore throat   Associated symptoms: no ear pain, no eye discharge, no shortness of breath and no wheezing   Fever Associated symptoms: chills, congestion, cough, headaches, myalgias and sore throat   Associated symptoms: no diarrhea, no ear pain, no nausea and no vomiting     Past Medical History:  Diagnosis Date   Pre-diabetes    Retained products of conception after procedure done for miscarriage 06/05/2022   Trichomonas     Patient Active Problem List   Diagnosis Date Noted   Obesity 03/22/2020   Former smoker 12/17/2018    Past Surgical History:  Procedure Laterality Date   CESAREAN SECTION N/A 05/27/2019   Procedure: CESAREAN SECTION;  Surgeon: Kathrynn Running, MD;  Location: [facility];  Service: Obstetrics;  Laterality: N/A;   DILATION AND EVACUATION N/A 05/17/2022   Procedure: DILATATION AND EVACUATION;  Surgeon: Hermina Staggers, MD;  Location: [facility];  Service: Gynecology;  Laterality: N/A;   DILATION AND EVACUATION N/A 06/07/2022   Procedure: DILATATION AND EVACUATION;  Surgeon: Tereso Newcomer, MD;  Location: [facility];  Service: Gynecology;  Laterality: N/A;   LAPAROSCOPY  06/23/2011   Procedure: LAPAROSCOPY  OPERATIVE;  Surgeon: Tereso Newcomer, MD;  Location: [facility];  Service: Gynecology;  Laterality: Left;  Diagnostic laparoscopy   LAPAROTOMY  06/23/2011   Procedure: LAPAROTOMY;  Surgeon: Tereso Newcomer, MD;  Location: [facility];  Service: Gynecology;  Laterality: Left;  Wedge resection of Ruptured left cornual ectopic pregnancy with left salpingectomy   OPERATIVE ULTRASOUND N/A 06/07/2022   Procedure: OPERATIVE ULTRASOUND;  Surgeon: Tereso Newcomer, MD;  Location: [facility];  Service: Gynecology;  Laterality: N/A;    OB History     Gravida  5   Para  1   Term  1   Preterm      AB  4   Living  1      SAB  2   IAB  1   Ectopic  1   Multiple  0   Live Births  1            Home Medications    Prior to Admission medications   Medication Sig Start Date End Date Taking? Authorizing Provider  cetirizine (ZYRTEC) 10 MG tablet Take 10 mg by mouth daily as needed for allergies.    [provider]  ferrous sulfate 325 (65 FE) MG tablet Take 1 tablet (325 mg total) by mouth every other day for 30 doses. 05/24/22 07/22/22  Alphia Kava, MD  metroNIDAZOLE (FLAGYL) 500 MG tablet Take 1 tablet (500 mg total) by mouth 2 (two) times daily. 11/13/22  Bernerd Limbo, CNM    Family History Family History  Problem Relation Age of Onset   Breast cancer Mother        around 40   Cancer Father        prostate   Diabetes Father    Breast cancer Maternal Aunt    Breast cancer Cousin    Breast cancer Maternal Aunt     Social History Social History   Tobacco Use   Smoking status: Former    Types: Cigars    Quit date: 09/01/2018    Years since quitting: 4.2   Smokeless tobacco: Never   Tobacco comments:    Blacks  Vaping Use   Vaping status: Never Used  Substance Use Topics   Alcohol use: Not Currently    Comment: occ   Drug use: Not Currently    Types: Marijuana    Comment: last use May 2020     Allergies   Patient has no known allergies.   Review of  Systems Review of Systems  Constitutional:  Positive for chills and fever.  HENT:  Positive for congestion and sore throat. Negative for ear pain.   Eyes:  Negative for discharge and redness.  Respiratory:  Positive for cough. Negative for shortness of breath and wheezing.   Gastrointestinal:  Negative for abdominal pain, diarrhea, nausea and vomiting.  Musculoskeletal:  Positive for myalgias.  Neurological:  Positive for headaches.     Physical Exam Triage Vital Signs ED Triage Vitals  Encounter Vitals Group     BP 12/18/22 1933 98/65     Systolic BP Percentile --      Diastolic BP Percentile --      Pulse Rate 12/18/22 1933 100     Resp 12/18/22 1933 16     Temp 12/18/22 1933 (!) 102.8 F (39.3 C)     Temp Source 12/18/22 1933 Oral     SpO2 12/18/22 1933 96 %     Weight --      Height --      Head Circumference --      Peak Flow --      Pain Score 12/18/22 1936 10     Pain Loc --      Pain Education --      Exclude from Growth Chart --    No data found.  Updated Vital Signs BP 98/65 (BP Location: Right Arm)   Pulse 100   Temp (!) 102.8 F (39.3 C) (Oral)   Resp 16   LMP 12/17/2022   SpO2 96%     Physical Exam Vitals and nursing note reviewed.  Constitutional:      General: She is not in acute distress.    Appearance: She is not ill-appearing.     Comments: Appears to not feel well, wrapped in blanket  HENT:     Head: Normocephalic and atraumatic.     Nose: Congestion present.     Mouth/Throat:     Mouth: Mucous membranes are moist.     Pharynx: No oropharyngeal exudate or posterior oropharyngeal erythema.  Eyes:     Conjunctiva/sclera: Conjunctivae normal.  Cardiovascular:     Rate and Rhythm: Normal rate and regular rhythm.     Heart sounds: Normal heart sounds. No murmur heard. Pulmonary:     Effort: Pulmonary effort is normal. No respiratory distress.     Breath sounds: Normal breath sounds. No wheezing, rhonchi or rales.  Skin:    General: Skin  is  warm and dry.  Neurological:     Mental Status: She is alert.  Psychiatric:        Mood and Affect: Mood normal.        Thought Content: Thought content normal.      UC Treatments / Results  Labs (all labs ordered are listed, but only abnormal results are displayed) Labs Reviewed  SARS CORONAVIRUS 2 (TAT 6-24 HRS)    EKG   Radiology No results found.  Procedures Procedures (including critical care time)  Medications Ordered in UC Medications  acetaminophen (TYLENOL) tablet 650 mg (650 mg Oral Given 12/18/22 1942)    Initial Impression / Assessment and Plan / UC Course  I have reviewed the triage vital signs and the nursing notes.  Pertinent labs & imaging results that were available during my care of the patient were reviewed by me and considered in my medical decision making (see chart for details).    Suspect viral etiology of symptoms.  Will screen for COVID.  Tylenol administered in office and recommended symptomatic treatment at home.  Encouraged increased fluids and rest.  Recommended follow-up if no gradual improvement with any further concerns.  Final Clinical Impressions(s) / UC Diagnoses   Final diagnoses:  Viral upper respiratory tract infection   Discharge Instructions   None    ED Prescriptions   None    PDMP not reviewed this encounter.   Tomi Bamberger, PA-C 12/18/22 [phone]

## 2022-12-19 LAB — SARS CORONAVIRUS 2 (TAT 6-24 HRS): SARS Coronavirus 2: POSITIVE — AB

## 2022-12-21 DIAGNOSIS — R509 Fever, unspecified: Secondary | ICD-10-CM | POA: Diagnosis not present

## 2022-12-21 DIAGNOSIS — R059 Cough, unspecified: Secondary | ICD-10-CM | POA: Diagnosis not present

## 2022-12-21 DIAGNOSIS — Z20822 Contact with and (suspected) exposure to covid-19: Secondary | ICD-10-CM | POA: Diagnosis not present

## 2022-12-21 DIAGNOSIS — R6883 Chills (without fever): Secondary | ICD-10-CM | POA: Diagnosis not present

## 2022-12-25 DIAGNOSIS — R509 Fever, unspecified: Secondary | ICD-10-CM | POA: Diagnosis not present

## 2022-12-25 DIAGNOSIS — Z20822 Contact with and (suspected) exposure to covid-19: Secondary | ICD-10-CM | POA: Diagnosis not present

## 2022-12-25 DIAGNOSIS — R6883 Chills (without fever): Secondary | ICD-10-CM | POA: Diagnosis not present

## 2022-12-25 DIAGNOSIS — R059 Cough, unspecified: Secondary | ICD-10-CM | POA: Diagnosis not present

## 2022-12-30 DIAGNOSIS — R6883 Chills (without fever): Secondary | ICD-10-CM | POA: Diagnosis not present

## 2022-12-30 DIAGNOSIS — R059 Cough, unspecified: Secondary | ICD-10-CM | POA: Diagnosis not present

## 2022-12-30 DIAGNOSIS — R509 Fever, unspecified: Secondary | ICD-10-CM | POA: Diagnosis not present

## 2022-12-30 DIAGNOSIS — Z20822 Contact with and (suspected) exposure to covid-19: Secondary | ICD-10-CM | POA: Diagnosis not present

## 2023-01-02 DIAGNOSIS — Z20822 Contact with and (suspected) exposure to covid-19: Secondary | ICD-10-CM | POA: Diagnosis not present

## 2023-01-02 DIAGNOSIS — R509 Fever, unspecified: Secondary | ICD-10-CM | POA: Diagnosis not present

## 2023-01-02 DIAGNOSIS — R059 Cough, unspecified: Secondary | ICD-10-CM | POA: Diagnosis not present

## 2023-01-02 DIAGNOSIS — R6883 Chills (without fever): Secondary | ICD-10-CM | POA: Diagnosis not present

## 2023-01-06 DIAGNOSIS — Z20822 Contact with and (suspected) exposure to covid-19: Secondary | ICD-10-CM | POA: Diagnosis not present

## 2023-01-06 DIAGNOSIS — R6883 Chills (without fever): Secondary | ICD-10-CM | POA: Diagnosis not present

## 2023-01-06 DIAGNOSIS — R059 Cough, unspecified: Secondary | ICD-10-CM | POA: Diagnosis not present

## 2023-01-06 DIAGNOSIS — R509 Fever, unspecified: Secondary | ICD-10-CM | POA: Diagnosis not present

## 2023-04-03 DIAGNOSIS — Z6841 Body Mass Index (BMI) 40.0 and over, adult: Secondary | ICD-10-CM | POA: Diagnosis not present

## 2023-04-03 DIAGNOSIS — Z713 Dietary counseling and surveillance: Secondary | ICD-10-CM | POA: Diagnosis not present

## 2023-04-08 ENCOUNTER — Emergency Department (HOSPITAL_COMMUNITY): Payer: BC Managed Care – PPO

## 2023-04-08 ENCOUNTER — Encounter (HOSPITAL_COMMUNITY): Payer: Self-pay

## 2023-04-08 ENCOUNTER — Other Ambulatory Visit: Payer: Self-pay

## 2023-04-08 ENCOUNTER — Emergency Department (HOSPITAL_COMMUNITY)
Admission: EM | Admit: 2023-04-08 | Discharge: 2023-04-08 | Disposition: A | Discharge status: Home / Self Care | Payer: BC Managed Care – PPO | Attending: Emergency Medicine | Admitting: Emergency Medicine

## 2023-04-08 DIAGNOSIS — M7661 Achilles tendinitis, right leg: Secondary | ICD-10-CM | POA: Diagnosis not present

## 2023-04-08 DIAGNOSIS — M25571 Pain in right ankle and joints of right foot: Secondary | ICD-10-CM | POA: Diagnosis not present

## 2023-04-08 DIAGNOSIS — M79671 Pain in right foot: Secondary | ICD-10-CM | POA: Diagnosis not present

## 2023-04-08 DIAGNOSIS — M7660 Achilles tendinitis, unspecified leg: Secondary | ICD-10-CM

## 2023-04-08 MED ORDER — OXYCODONE-ACETAMINOPHEN 5-325 MG PO TABS
1.0000 | ORAL_TABLET | Freq: Once | ORAL | Status: AC
  Administered 2023-04-08: 1 via ORAL
  Filled 2023-04-08: qty 1

## 2023-04-08 NOTE — ED Triage Notes (Signed)
Pt c/o  right ankle pain that started  yesterday evening. Pt states this morning she couldn't  walk on her ankle. Pt denies injury. No swelling noted to ankle.

## 2023-04-08 NOTE — Progress Notes (Signed)
Orthopedic Tech Progress Note Patient Details:  Brittany Quinn 02/25/1991 [id number]  Applied ASO lace up and left crutches at the ED bedside. Ortho Devices Type of Ortho Device: ASO Ortho Device/Splint Location: RLE Ortho Device/Splint Interventions: Ordered, Application, Adjustment   Post Interventions Patient Tolerated: Well Instructions Provided: Care of device, Adjustment of device  Sherilyn Banker 04/08/2023, 8:57 AM

## 2023-04-08 NOTE — ED Notes (Addendum)
OT called for brace/crutches.

## 2023-04-08 NOTE — ED Provider Notes (Signed)
[place] [facility] Provider Note   CSN: [id number] Arrival date & time: 04/08/23  6237     History Chief Complaint  Patient presents with   Ankle Pain    Claresa Canale is a 32 y.o. female reportedly otherwise healthy presents to the ER for evaluation of right ankle pain since yesterday. She denies any known injury or trauma. She denies any new shoes. She reports she has pain with walking on it.  Denies any pain in the upper leg or any swelling.  Denies any fevers.  No known drug allergies.  Denies any tobacco, EtOH, other drug use.   Ankle Pain Associated symptoms: no fever        Home Medications Prior to Admission medications   Medication Sig Start Date End Date Taking? Authorizing Provider  cetirizine (ZYRTEC) 10 MG tablet Take 10 mg by mouth daily as needed for allergies.    [provider]  ferrous sulfate 325 (65 FE) MG tablet Take 1 tablet (325 mg total) by mouth every other day for 30 doses. 05/24/22 07/22/22  Alphia Kava, MD  metroNIDAZOLE (FLAGYL) 500 MG tablet Take 1 tablet (500 mg total) by mouth 2 (two) times daily. 11/13/22   Bernerd Limbo, CNM      Allergies    Patient has no known allergies.    Review of Systems   Review of Systems  Constitutional:  Negative for chills and fever.  Musculoskeletal:  Positive for myalgias. Negative for joint swelling.    Physical Exam Updated Vital Signs BP (!) 142/83 (BP Location: Left Arm)   Pulse 84   Temp 98.8 F (37.1 C) (Oral)   Resp 16   Ht 5\' 3"  (1.6 m)   Wt 100.4 kg   SpO2 100%   BMI 39.21 kg/m  Physical Exam Vitals and nursing note reviewed.  Constitutional:      General: She is not in acute distress.    Appearance: She is not ill-appearing or toxic-appearing.  Eyes:     General: No scleral icterus. Pulmonary:     Effort: Pulmonary effort is normal. No respiratory distress.  Musculoskeletal:        General: No swelling or deformity.     Right lower  leg: No edema.     Left lower leg: No edema.     Comments: Patient has tenderness over the Achilles tendon on the right side.  Achilles tendon appears intact on calf squeeze.  She does have pain with flexion extension of the foot at the site of the Achilles.  She is palpable DP and PT pulses.  No obvious swelling noted.  No overlying erythema, warmth, induration, or fluctuance noted.  Brisk cap refill present in all toes.  Sensation reportedly intact throughout.  Compartments are soft.  Skin:    General: Skin is warm and dry.  Neurological:     Mental Status: She is alert.     ED Results / Procedures / Treatments   Labs (all labs ordered are listed, but only abnormal results are displayed) Labs Reviewed - No data to display  EKG None  Radiology DG Ankle Complete Right Result Date: 04/08/2023 CLINICAL DATA:  Acute right ankle pain without known injury. EXAM: RIGHT ANKLE - COMPLETE 3+ VIEW COMPARISON:  None Available. FINDINGS: There is no evidence of fracture, dislocation, or joint effusion. There is no evidence of arthropathy or other focal bone abnormality. Soft tissues are unremarkable. IMPRESSION: Negative. Electronically Signed   By: Fayrene Fearing  Christen Butter M.D.   On: 04/08/2023 08:26   DG Foot Complete Right Result Date: 04/08/2023 CLINICAL DATA:  Right foot pain without known injury. EXAM: RIGHT FOOT COMPLETE - 3+ VIEW COMPARISON:  None Available. FINDINGS: There is no evidence of fracture or dislocation. There is no evidence of arthropathy or other focal bone abnormality. Soft tissues are unremarkable. IMPRESSION: Negative. Electronically Signed   By: Lupita Raider M.D.   On: 04/08/2023 08:25    Procedures Procedures   Medications Ordered in ED Medications - No data to display  ED Course/ Medical Decision Making/ A&P    Medical Decision Making Amount and/or Complexity of Data Reviewed Radiology: ordered.  Risk Prescription drug management.   32 y.o. female presents to  the ER for evaluation of right ankle pain. Differential diagnosis includes but is not limited to sprain, strain, fracture, dislocation, Achilles tendon injury. Vital signs mildly elevated blood pressure 142/83 otherwise unremarkable. Physical exam as noted above.   XR imaging shows negative for the foot and ankle. Per radiologist's interpretation.   Think the patient likely has some Achilles tendinitis.  I have a lower excision for any DVT as patient does not have any leg swelling or pain into her calf.  Achilles does to be intact on calf squeeze.  She is neurovascular intact distally with palpable DP and PT pulses bilaterally.  Brisk cap refill present in all toes.  There is no swelling or any overlying skin changes concerning for cellulitis or deeper infection.  Will place the patient in a lace up brace and a neutral position and give her crutches.  Will have her follow-up with orthopedics.  Recommended Tylenol ibuprofen as needed for pain but suggested he is ibuprofen if concern for pregnancy.  Information for orthopedic provider given.  She stable for discharge home with outpatient follow-up.  We discussed the results of the labs/imaging. The plan is brace, follow-up with Ortho, pain medication at needed. We discussed strict return precautions and red flag symptoms. The patient verbalized their understanding and agrees to the plan. The patient is stable and being discharged home in good condition.  Portions of this report may have been transcribed using voice recognition software. Every effort was made to ensure accuracy; however, inadvertent computerized transcription errors may be present.   Final Clinical Impression(s) / ED Diagnoses Final diagnoses:  Achilles tendon pain    Rx / DC Orders ED Discharge Orders     None         Achille Rich, PA-C 04/08/23 [id number]    Virgina Norfolk, DO 04/08/23 1221

## 2023-04-08 NOTE — Discharge Instructions (Addendum)
You were seen in the ER today for evaluation of your ankle pain. Your XR was unremarkable. I think this is likely a sprain of your achilles tendon. I would like for you to follow up with an orthopedic provider. I have included the information for one on the discharge paperwork. Please call to schedule an appointment. For pain, I recommend 600mg  of ibuprofen and/or 1000mg  Tylenol every 6 hours as needed for pain.  Please do not take ibuprofen if you are concerned for any pregnancy.  I have also included information on the RICE method into this discharge paperwork as well.  You can take the brace off when showering or sleeping.  Use your crutches as needed.  Try not to weight-bear.  Again, make sure you follow with orthopedic provider.  I included more information into the discharge report for you to review.  If you have any concerns, new or worsening symptoms, please return to your nearest emerged department for reevaluation.  Contact a health care provider if: Your symptoms get worse. Your pain does not get better with medicine. You have new symptoms that you cannot explain. You have warmth and swelling in your foot. You have a fever. Get help right away if: You hear a sudden popping sound in your Achilles tendon and then have severe pain. You cannot move your toes or foot. You cannot put any weight on your foot. Your foot or toes become numb and look white or blue even after you loosen your bandage or air cast.

## 2023-04-16 NOTE — Progress Notes (Signed)
 ANNUAL EXAM Patient name: Brittany Quinn MRN [id number]  Date of birth: Nov 22, 1990 Chief Complaint:   Gynecologic Exam  History of Present Illness:   Brittany Quinn is a 32 y.o. [phone] African-American female being seen today for a routine annual exam.  Current complaints: None  Patient's last menstrual period was 03/27/2023 (exact date).  Last pap 03/22/2020. Results were: NILM w/ HRHPV negative.  -01/28/2017- NILM w/HRHPV not performed. H/O abnormal pap: no Last mammogram: 05/2020. Results were: abnormal (chronic abscess of R breast). Family h/o breast cancer: yes Mother dx in 10's. Maternal aunt. Maternal cousin.  Last colonoscopy: Not indicated. Results were: N/A. Family h/o colorectal cancer: no     04/18/2023    8:26 AM 06/15/2022    9:18 AM 05/30/2022   11:00 AM 05/16/2022    5:41 PM 03/07/2022    5:08 PM  Depression screen PHQ 2/9  Decreased Interest 0 0 2 0 1  Down, Depressed, Hopeless 0 0 0 0 0  PHQ - 2 Score 0 0 2 0 1  Altered sleeping 2 1 3 2  0  Tired, decreased energy 2 0 2 1 2   Change in appetite 0 0 0 1 0  Feeling bad or failure about yourself  0 0 0 0 0  Trouble concentrating 0 0 0 0 0  Moving slowly or fidgety/restless 0 0 0 0 0  Suicidal thoughts 0 0 0 0 0  PHQ-9 Score 4 1 7 4 3         04/18/2023    8:26 AM 06/15/2022    9:18 AM 05/30/2022   11:00 AM 05/16/2022    5:42 PM  GAD 7 : Generalized Anxiety Score  Nervous, Anxious, on Edge 0 0 0 0  Control/stop worrying 0 0 0 0  Worry too much - different things 1 0 0 0  Trouble relaxing 0 0 0 0  Restless 0 0 0 0  Easily annoyed or irritable 0 0 0 0  Afraid - awful might happen 0 0 0 0  Total GAD 7 Score 1 0 0 0     Review of Systems:   Pertinent items are noted in HPI Denies any headaches, blurred vision, fatigue, shortness of breath, chest pain, abdominal pain, abnormal vaginal discharge/itching/odor/irritation, problems with periods, bowel movements, urination, or intercourse unless otherwise stated  above. Pertinent History Reviewed:  Reviewed past medical,surgical, social and family history.  Reviewed problem list, medications and allergies. Physical Assessment:   Vitals:   04/18/23 0823  BP: 121/73  Pulse: 79  Weight: 228 lb (103.4 kg)  Height: 5' 3 (1.6 m)  Body mass index is 40.39 kg/m.        Physical Examination:   General appearance - well appearing, and in no distress  Mental status - alert, oriented to person, place, and time  Psych:  She has a normal mood and affect  Skin - warm and dry, normal color, no suspicious lesions noted  Chest - effort normal, all lung fields clear to auscultation bilaterally  Heart - normal rate and regular rhythm  Neck:  midline trachea, no thyromegaly or nodules  Breasts - breasts appear normal, no suspicious masses, no skin or nipple changes or  axillary nodes  Abdomen - soft, nontender, nondistended, no masses or organomegaly  Pelvic - VULVA: normal appearing vulva with no masses, tenderness or lesions  VAGINA: . +Thick white vaginal discharge. normal appearing vagina with normal color and discharge, no lesions CERVIX: normal appearing cervix without discharge or  lesions, no CMT  Thin prep pap is done with HR HPV cotesting  UTERUS: uterus is felt to be normal size, shape, consistency and nontender   ADNEXA: No adnexal masses or tenderness noted.  Extremities:  No swelling or varicosities noted  Chaperone present for exam  No results found for this or any previous visit (from the past 24 hours).  Assessment & Plan:   1. Encounter for annual routine gynecological examination (Primary) -Cervical cancer screening: Discussed guidelines. Pap with HPV done today -Gardasil: Has not yet had. Counseling provided and pt accepts. 3/3 doses complete.  -GC/CT: declines -Birth Control: declines -Breast Health:  Patient with family history of 1st and 2nd degree relatives with breast cancer. She would like to proceed with genetic testing to  determine lifetime risk and for timeline to start mammography screening, possibly before age 53. Referred. Previous mammogram with R chronic abscess (05/2020).Encouraged self breast awareness/SBE. Teaching provided.  - F/U 12 months and prn   - Cytology - PAP( [facility]) - HIV Antibody (routine testing w rflx) - RPR - Empower Comprehensive (5+76) - HPV 9-valent vaccine,Recombinat - Cervicovaginal ancillary only( [facility])  2. Need for HPV vaccination - HPV 9-valent vaccine,Recombinat  3. Candidal vulvovaginitis - fluconazole  (DIFLUCAN ) 150 MG tablet; Take 1 tablet (150 mg total) by mouth once for 1 dose.  Dispense: 1 tablet; Refill: 0   The pregnancy intention screening data noted above was reviewed. Potential methods of contraception were discussed. The patient elected to proceed without contraception.  Orders Placed This Encounter  Procedures   HPV 9-valent vaccine,Recombinat   HIV Antibody (routine testing w rflx)   RPR   Empower Comprehensive (5+76)   Meds:  Meds ordered this encounter  Medications   fluconazole  (DIFLUCAN ) 150 MG tablet    Sig: Take 1 tablet (150 mg total) by mouth once for 1 dose.    Dispense:  1 tablet    Refill:  0    Follow-up: Return in about 1 year (around 04/17/2024) for ANN.  Bhumi Godbey E Moustapha Tooker, [place] 04/18/2023 12:08 PM

## 2023-04-18 ENCOUNTER — Ambulatory Visit: Payer: BC Managed Care – PPO | Admitting: Certified Nurse Midwife

## 2023-04-18 ENCOUNTER — Other Ambulatory Visit: Payer: Self-pay

## 2023-04-18 ENCOUNTER — Other Ambulatory Visit (HOSPITAL_COMMUNITY)
Admission: RE | Admit: 2023-04-18 | Discharge: 2023-04-18 | Disposition: A | Discharge status: Home / Self Care | Payer: BC Managed Care – PPO | Source: Ambulatory Visit | Attending: Certified Nurse Midwife | Admitting: Certified Nurse Midwife

## 2023-04-18 ENCOUNTER — Encounter: Payer: Self-pay | Admitting: Orthopedic Surgery

## 2023-04-18 ENCOUNTER — Ambulatory Visit: Payer: BC Managed Care – PPO | Admitting: Orthopedic Surgery

## 2023-04-18 ENCOUNTER — Encounter: Payer: Self-pay | Admitting: Certified Nurse Midwife

## 2023-04-18 VITALS — BP 121/73 | HR 79 | Ht 63.0 in | Wt 228.0 lb

## 2023-04-18 DIAGNOSIS — Z01419 Encounter for gynecological examination (general) (routine) without abnormal findings: Secondary | ICD-10-CM

## 2023-04-18 DIAGNOSIS — B9689 Other specified bacterial agents as the cause of diseases classified elsewhere: Secondary | ICD-10-CM

## 2023-04-18 DIAGNOSIS — B3731 Acute candidiasis of vulva and vagina: Secondary | ICD-10-CM

## 2023-04-18 DIAGNOSIS — A5901 Trichomonal vulvovaginitis: Secondary | ICD-10-CM | POA: Insufficient documentation

## 2023-04-18 DIAGNOSIS — M7661 Achilles tendinitis, right leg: Secondary | ICD-10-CM

## 2023-04-18 DIAGNOSIS — Z23 Encounter for immunization: Secondary | ICD-10-CM

## 2023-04-18 DIAGNOSIS — N760 Acute vaginitis: Secondary | ICD-10-CM | POA: Insufficient documentation

## 2023-04-18 LAB — CERVICOVAGINAL ANCILLARY ONLY
Bacterial Vaginitis (gardnerella): POSITIVE — AB
Candida Glabrata: NEGATIVE
Candida Vaginitis: NEGATIVE
Comment: NEGATIVE
Comment: NEGATIVE
Comment: NEGATIVE
Comment: NEGATIVE
Trichomonas: POSITIVE — AB

## 2023-04-18 MED ORDER — FLUCONAZOLE 150 MG PO TABS: 150.0000 mg | ORAL_TABLET | Freq: Once | ORAL | 0 refills | Status: AC

## 2023-04-18 NOTE — Progress Notes (Deleted)
 ANNUAL EXAM Patient name: Brittany Quinn MRN [id number]  Date of birth: 05/28/1990 Chief Complaint:   Gynecologic Exam  History of Present Illness:   Brittany Quinn is a 33 y.o. [phone] {race:25618} female being seen today for a routine annual exam.  Current complaints: ***  Patient's last menstrual period was 03/27/2023 (exact date).   The pregnancy intention screening data noted above was reviewed. Potential methods of contraception were discussed. The patient elected to proceed with No data recorded.   Last pap 03/22/2020. Results were: NILM w/ HRHPV negative.  -01/28/2017- NILM w/HRHPV not performed.  H/O abnormal pap: {yes/yes***/no:23866} Last mammogram: Not indicated. Results were: {normal, abnormal, n/a:23837}. Family h/o breast cancer: {yes***/no:23838} Last colonoscopy: Not indicated. Results were: {normal, abnormal, n/a:23837}. Family h/o colorectal cancer: {yes***/no:23838}     04/18/2023    8:26 AM 06/15/2022    9:18 AM 05/30/2022   11:00 AM 05/16/2022    5:41 PM 03/07/2022    5:08 PM  Depression screen PHQ 2/9  Decreased Interest 0 0 2 0 1  Down, Depressed, Hopeless 0 0 0 0 0  PHQ - 2 Score 0 0 2 0 1  Altered sleeping 2 1 3 2  0  Tired, decreased energy 2 0 2 1 2   Change in appetite 0 0 0 1 0  Feeling bad or failure about yourself  0 0 0 0 0  Trouble concentrating 0 0 0 0 0  Moving slowly or fidgety/restless 0 0 0 0 0  Suicidal thoughts 0 0 0 0 0  PHQ-9 Score 4 1 7 4 3         04/18/2023    8:26 AM 06/15/2022    9:18 AM 05/30/2022   11:00 AM 05/16/2022    5:42 PM  GAD 7 : Generalized Anxiety Score  Nervous, Anxious, on Edge 0 0 0 0  Control/stop worrying 0 0 0 0  Worry too much - different things 1 0 0 0  Trouble relaxing 0 0 0 0  Restless 0 0 0 0  Easily annoyed or irritable 0 0 0 0  Afraid - awful might happen 0 0 0 0  Total GAD 7 Score 1 0 0 0     Review of Systems:   Pertinent items are noted in HPI Denies any headaches, blurred vision, fatigue, shortness  of breath, chest pain, abdominal pain, abnormal vaginal discharge/itching/odor/irritation, problems with periods, bowel movements, urination, or intercourse unless otherwise stated above. Pertinent History Reviewed:  Reviewed past medical,surgical, social and family history.  Reviewed problem list, medications and allergies. Physical Assessment:   Vitals:   04/18/23 0823  BP: 121/73  Pulse: 79  Weight: 228 lb (103.4 kg)  Height: 5' 3 (1.6 m)  Body mass index is 40.39 kg/m.        Physical Examination:   General appearance - well appearing, and in no distress  Mental status - alert, oriented to person, place, and time  Psych:  She has a normal mood and affect  Skin - warm and dry, normal color, no suspicious lesions noted  Chest - effort normal, all lung fields clear to auscultation bilaterally  Heart - normal rate and regular rhythm  Neck:  midline trachea, no thyromegaly or nodules  Breasts - breasts appear normal, no suspicious masses, no skin or nipple changes or  axillary nodes  Abdomen - soft, nontender, nondistended, no masses or organomegaly  Pelvic - VULVA: normal appearing vulva with no masses, tenderness or lesions  VAGINA: normal appearing vagina  with normal color and discharge, no lesions  CERVIX: normal appearing cervix without discharge or lesions, no CMT  Thin prep pap is {Desc; done/not:10129} *** HR HPV cotesting  UTERUS: uterus is felt to be normal size, shape, consistency and nontender   ADNEXA: No adnexal masses or tenderness noted.  Extremities:  No swelling or varicosities noted  Chaperone present for exam  No results found for this or any previous visit (from the past 24 hours).  Assessment & Plan:  - Cervical cancer screening: Discussed guidelines. Pap with HPV *** - Gardasil: {Blank single:19197::***,has not yet had. Will provide information,completed,has not yet had. Counseling provided and she declines,Has not yet had. Counseling provided and  pt accepts} - GC/CT: {Blank single:19197::accepts,declines,not indicated} - Birth Control: {Birth control type:23956} - Breast Health: Encouraged self breast awareness/SBE. Teaching provided.  - F/U 12 months and prn  Labs/procedures today: ***   No orders of the defined types were placed in this encounter.   Meds: No orders of the defined types were placed in this encounter.   Follow-up: No follow-ups on file.  Tobey Lippard E Kaitlin Alcindor, [place] 04/18/2023 8:39 AM

## 2023-04-18 NOTE — Progress Notes (Signed)
 Office Visit Note   Patient: Brittany Quinn           Date of Birth: [birth year]           MRN: [id number] Visit Date: 04/18/2023              Requested by: No referring provider defined for this encounter. PCP: Patient, No Pcp Per  Chief Complaint  Patient presents with   Right Ankle - Pain    Achilles tendon      HPI: Patient is a 33 year old woman who is seen for initial evaluation for right Achilles tendinitis.  Patient has pain with ambulation.  She was provided an ASO and crutches she is currently not wearing the ASO.  Patient states she has to limp to walk.  Assessment & Plan: Visit Diagnoses:  1. Achilles tendinitis, right leg     Plan: Recommended topical Voltaren gel and recommend resuming the ASO.  Will have her follow-up with Dr. Burnetta for evaluation for shockwave therapy.  Follow-Up Instructions: No follow-ups on file.   Ortho Exam  Patient is alert, oriented, no adenopathy, well-dressed, normal affect, normal respiratory effort. Examination patient has a good anterior tibial pulse.  The plantar fascia is not tender to palpation.  Lateral compression of the calcaneus is not tender to palpation.  She is tender to palpation directly at the insertion of the Achilles.  Radiograph shows mild calcification at the insertion of the Achilles.  Imaging: No results found. No images are attached to the encounter.  Labs: Lab Results  Component Value Date   HGBA1C 5.9 (H) 03/07/2022   HGBA1C 5.1 12/17/2018     Lab Results  Component Value Date   ALBUMIN  3.4 (L) 05/24/2022   ALBUMIN  4.7 10/04/2017   ALBUMIN  3.8 09/16/2017    No results found for: MG No results found for: VD25OH  No results found for: PREALBUMIN    Latest Ref Rng & Units 06/07/2022   10:06 AM 05/24/2022    9:16 PM 05/17/2022    8:33 AM  CBC EXTENDED  WBC 4.0 - 10.5 K/uL 6.0  10.4  6.6   RBC 3.87 - 5.11 MIL/uL 3.38  2.84  4.68   Hemoglobin 12.0 - 15.0 g/dL 89.8  9.0  84.9   HCT 63.9 -  46.0 % 32.7  27.5  43.6   Platelets 150 - 400 K/uL 304  307  237   NEUT# 1.7 - 7.7 K/uL  6.2    Lymph# 0.7 - 4.0 K/uL  3.5       There is no height or weight on file to calculate BMI.  Orders:  No orders of the defined types were placed in this encounter.  No orders of the defined types were placed in this encounter.    Procedures: No procedures performed  Clinical Data: No additional findings.  ROS:  All other systems negative, except as noted in the HPI. Review of Systems  Objective: Vital Signs: LMP 03/27/2023 (Exact Date)   Specialty Comments:  No specialty comments available.  PMFS History: Patient Active Problem List   Diagnosis Date Noted   Obesity 03/22/2020   Former smoker 12/17/2018   Past Medical History:  Diagnosis Date   Pre-diabetes    Retained products of conception after procedure done for miscarriage 06/05/2022   Trichomonas     Family History  Problem Relation Age of Onset   Breast cancer Mother        around 62   Cancer Father  prostate   Diabetes Father    Breast cancer Maternal Aunt    Breast cancer Cousin    Breast cancer Maternal Aunt     Past Surgical History:  Procedure Laterality Date   CESAREAN SECTION N/A 05/27/2019   Procedure: CESAREAN SECTION;  Surgeon: Kandis Devaughn Sayres, MD;  Location: [facility];  Service: Obstetrics;  Laterality: N/A;   DILATION AND EVACUATION N/A 05/17/2022   Procedure: DILATATION AND EVACUATION;  Surgeon: Lorence Ozell CROME, MD;  Location: [facility] OR;  Service: Gynecology;  Laterality: N/A;   DILATION AND EVACUATION N/A 06/07/2022   Procedure: DILATATION AND EVACUATION;  Surgeon: Herchel Gloris LABOR, MD;  Location: [facility] OR;  Service: Gynecology;  Laterality: N/A;   LAPAROSCOPY  06/23/2011   Procedure: LAPAROSCOPY OPERATIVE;  Surgeon: Gloris LABOR Herchel, MD;  Location: [facility];  Service: Gynecology;  Laterality: Left;  Diagnostic laparoscopy   LAPAROTOMY  06/23/2011   Procedure: LAPAROTOMY;  Surgeon: Gloris LABOR Herchel, MD;  Location: [facility];  Service: Gynecology;  Laterality: Left;  Wedge resection of Ruptured left cornual ectopic pregnancy with left salpingectomy   OPERATIVE ULTRASOUND N/A 06/07/2022   Procedure: OPERATIVE ULTRASOUND;  Surgeon: Herchel Gloris LABOR, MD;  Location: [facility];  Service: Gynecology;  Laterality: N/A;   Social History   Occupational History   Not on file  Tobacco Use   Smoking status: Former    Types: Cigars    Quit date: 09/01/2018    Years since quitting: 4.6   Smokeless tobacco: Never   Tobacco comments:    Blacks  Vaping Use   Vaping status: Never Used  Substance and Sexual Activity   Alcohol use: Not Currently    Comment: occ   Drug use: Not Currently    Types: Marijuana    Comment: last use May 2020   Sexual activity: Yes    Birth control/protection: None

## 2023-04-19 LAB — HIV ANTIBODY (ROUTINE TESTING W REFLEX): HIV Screen 4th Generation wRfx: NONREACTIVE

## 2023-04-19 LAB — RPR: RPR Ser Ql: NONREACTIVE

## 2023-04-22 LAB — CYTOLOGY - PAP
Adequacy: ABSENT
Chlamydia: NEGATIVE
Comment: NEGATIVE
Comment: NEGATIVE
Comment: NEGATIVE
Comment: NORMAL
Diagnosis: NEGATIVE
High risk HPV: NEGATIVE
Neisseria Gonorrhea: NEGATIVE
Trichomonas: POSITIVE — AB

## 2023-04-22 MED ORDER — METRONIDAZOLE 500 MG PO TABS: 500.0000 mg | ORAL_TABLET | Freq: Two times a day (BID) | ORAL | 0 refills | Status: DC

## 2023-04-22 NOTE — Addendum Note (Signed)
 Addended by: Edd Arbour on: 04/22/2023 11:39 AM   Modules accepted: Orders

## 2023-04-29 ENCOUNTER — Other Ambulatory Visit: Payer: Self-pay

## 2023-04-29 ENCOUNTER — Ambulatory Visit (INDEPENDENT_AMBULATORY_CARE_PROVIDER_SITE_OTHER): Payer: BC Managed Care – PPO | Admitting: Sports Medicine

## 2023-04-29 ENCOUNTER — Encounter: Payer: Self-pay | Admitting: Sports Medicine

## 2023-04-29 DIAGNOSIS — M7731 Calcaneal spur, right foot: Secondary | ICD-10-CM

## 2023-04-29 DIAGNOSIS — M7661 Achilles tendinitis, right leg: Secondary | ICD-10-CM | POA: Diagnosis not present

## 2023-04-29 MED ORDER — PREDNISONE 20 MG PO TABS: 20.0000 mg | ORAL_TABLET | Freq: Every day | ORAL | 0 refills | Status: AC

## 2023-04-29 MED ORDER — PREDNISONE 20 MG PO TABS: 20.0000 mg | ORAL_TABLET | Freq: Every day | ORAL | 0 refills | Status: DC

## 2023-04-29 NOTE — Progress Notes (Signed)
 Patient says that she began having pain in the back of the right heel since the weekend before Christmas. She says she did not have any injury, but woke up one morning with pain and then was unable to walk the next day. She says that her pain has been in the back of the heel, and was initially sharp but is now more dull. She says that she has been wearing her brace since her visit to the emergency room, although she does take it off at night. She is not currently taking pain medication but she does use ice off and on and says that she gets relief with that.

## 2023-04-29 NOTE — Progress Notes (Signed)
 Brittany Quinn - 33 y.o. female MRN [id number]  Date of birth: 1991-02-01  Office Visit Note: Visit Date: 04/29/2023 PCP: Patient, No Pcp Per Referred by: No ref. provider found  Subjective: Chief Complaint  Patient presents with   Right Ankle - Pain   HPI: Brittany Quinn is a pleasant 33 y.o. female who presents today for evaluation of right Achilles/heel pain.  She has had rather bothersome pain since just before Christmas.  No known injury or insidious event.  She has been using ice on and off and elevation and does get some relief from this.  No other structured medication.  She is wearing an ASO ankle brace which does help to some extent.  Pertinent ROS were reviewed with the patient and found to be negative unless otherwise specified above in HPI.   Assessment & Plan: Visit Diagnoses:  1. Achilles tendinitis, right leg   2. Heel spur, right    Plan: Impression is distal insertional Achilles tendinitis with small enthesophytes over the superior calcaneus.  This is making ambulation and other activities of daily living difficult.  Through shared decision-making, we did proceed with a trial of extracorporeal shockwave therapy, patient tolerated well.  I did place a 7/16 inch heel lift into the Xu to help offload the Achilles which she found comfortable.  We did ultrasound her Achilles which showed no evidence of tearing or tendon thickening but there is a degree of tendinitis. For this inflammatory component, we will start her on a short course of prednisone  20mg  x 7 days only.  I would like to see her back over the next 7-10 days and we will reevaluate and proceed with an additional treatment of ESWT.  After 2-3 sessions we will see what sort of cumulative benefit she has going forward.  May consider formal PT/HEP at future visits as well.  Follow-up: Return for f/u in 7-10 days for R-achilles .   Meds & Orders:  Meds ordered this encounter  Medications   DISCONTD: predniSONE   (DELTASONE ) 20 MG tablet    Sig: Take 1 tablet (20 mg total) by mouth daily with breakfast for 7 days.    Dispense:  7 tablet    Refill:  0   predniSONE  (DELTASONE ) 20 MG tablet    Sig: Take 1 tablet (20 mg total) by mouth daily with breakfast for 7 days.    Dispense:  7 tablet    Refill:  0    Orders Placed This Encounter  Procedures   US  Extrem Low Right Ltd     Procedures: Procedure: ECSWT Indications:  Achilles Tendinopathy/Tendinitis   Procedure Details Consent: Risks of procedure as well as the alternatives and risks of each were explained to the patient.  Verbal consent for procedure obtained. Time Out: Verified patient identification, verified procedure, site was marked, verified correct patient position. The area was cleaned with alcohol swab.     The Right achilles was targeted for Extracorporeal shockwave therapy.    Preset: Achillodynia Power Level: 70-90 mJ Frequency: 8-10 Impulse/cycles: 2250 Head size: Regular   Patient tolerated procedure well without immediate complications.       Clinical History: No specialty comments available.  She reports that she quit smoking about 4 years ago. Her smoking use included cigars. She has never used smokeless tobacco. No results for input(s): HGBA1C, LABURIC in the last 8760 hours.  Objective:   Vital Signs: LMP 03/27/2023 (Exact Date)   Physical Exam  Gen: Well-appearing, in no acute distress; non-toxic  CV: Well-perfused. Warm.  Resp: Breathing unlabored on room air; no wheezing. Psych: Fluid speech in conversation; appropriate affect; normal thought process  Ortho Exam - Right foot/achilles: + TTP at the distal aspect of the Achilles near the insertion and at the superior calcaneus.  No plantar calcaneal TTP or plantar fascia TTP.  There is a degree of Achilles contracture with pain with dorsiflexion and slightly restricted motion.  Imaging:  US  Extrem Low Right Ltd Limited musculoskeletal ultrasound of  the right lower extremity, right  Achilles was performed today.  The Achilles was scanned from the proximal  to distal insertion without tendon abnormality or tearing.  There is  appropriate thickness which is 0.355 cm in nature.  There is a mild degree  of hyperemia at the insertion indicative of inflammation.  There are small  calcification off the superior aspect of the Achilles near the insertion  indicative of enthesophytes.  *I did review ankle and foot x-rays from 04/08/2023.  There is a very small plantar calcaneal spur and mild enthesophytes off the superior calcaneus/distal Achilles.  No acute fracture or otherwise bony abnormality noted.  DG Ankle Complete Right CLINICAL DATA:  Acute right ankle pain without known injury.  EXAM: RIGHT ANKLE - COMPLETE 3+ VIEW  COMPARISON:  None Available.  FINDINGS: There is no evidence of fracture, dislocation, or joint effusion. There is no evidence of arthropathy or other focal bone abnormality. Soft tissues are unremarkable.  IMPRESSION: Negative.  Electronically Signed   By: Lynwood Landy Raddle M.D.   On: 04/08/2023 08:26 DG Foot Complete Right CLINICAL DATA:  Right foot pain without known injury.  EXAM: RIGHT FOOT COMPLETE - 3+ VIEW  COMPARISON:  None Available.  FINDINGS: There is no evidence of fracture or dislocation. There is no evidence of arthropathy or other focal bone abnormality. Soft tissues are unremarkable.  IMPRESSION: Negative.  Electronically Signed   By: Lynwood Landy Raddle M.D.   On: 04/08/2023 08:25  Past Medical/Family/Surgical/Social History: Medications & Allergies reviewed per EMR, new medications updated. Patient Active Problem List   Diagnosis Date Noted   Obesity 03/22/2020   Former smoker 12/17/2018   Past Medical History:  Diagnosis Date   Pre-diabetes    Retained products of conception after procedure done for miscarriage 06/05/2022   Trichomonas    Family History  Problem Relation  Age of Onset   Breast cancer Mother        around 26   Cancer Father        prostate   Diabetes Father    Breast cancer Maternal Aunt    Breast cancer Cousin    Breast cancer Maternal Aunt    Past Surgical History:  Procedure Laterality Date   CESAREAN SECTION N/A 05/27/2019   Procedure: CESAREAN SECTION;  Surgeon: Kandis Devaughn Sayres, MD;  Location: [facility];  Service: Obstetrics;  Laterality: N/A;   DILATION AND EVACUATION N/A 05/17/2022   Procedure: DILATATION AND EVACUATION;  Surgeon: Lorence Ozell CROME, MD;  Location: [facility] OR;  Service: Gynecology;  Laterality: N/A;   DILATION AND EVACUATION N/A 06/07/2022   Procedure: DILATATION AND EVACUATION;  Surgeon: Herchel Gloris LABOR, MD;  Location: [facility] OR;  Service: Gynecology;  Laterality: N/A;   LAPAROSCOPY  06/23/2011   Procedure: LAPAROSCOPY OPERATIVE;  Surgeon: Gloris LABOR Herchel, MD;  Location: [facility];  Service: Gynecology;  Laterality: Left;  Diagnostic laparoscopy   LAPAROTOMY  06/23/2011   Procedure: LAPAROTOMY;  Surgeon: Gloris LABOR Herchel, MD;  Location: [facility]  ORS;  Service: Gynecology;  Laterality: Left;  Wedge resection of Ruptured left cornual ectopic pregnancy with left salpingectomy   OPERATIVE ULTRASOUND N/A 06/07/2022   Procedure: OPERATIVE ULTRASOUND;  Surgeon: Herchel Gloris LABOR, MD;  Location: [facility] OR;  Service: Gynecology;  Laterality: N/A;   Social History   Occupational History   Not on file  Tobacco Use   Smoking status: Former    Types: Cigars    Quit date: 09/01/2018    Years since quitting: 4.6   Smokeless tobacco: Never   Tobacco comments:    Blacks  Vaping Use   Vaping status: Never Used  Substance and Sexual Activity   Alcohol use: Not Currently    Comment: occ   Drug use: Not Currently    Types: Marijuana    Comment: last use May 2020   Sexual activity: Yes    Birth control/protection: None

## 2023-04-30 LAB — EMPOWER COMPREHENSIVE (5+76): REPORT SUMMARY: NEGATIVE

## 2023-05-01 ENCOUNTER — Other Ambulatory Visit: Payer: Self-pay | Admitting: Physician Assistant

## 2023-05-06 ENCOUNTER — Encounter: Payer: Self-pay | Admitting: Sports Medicine

## 2023-05-06 ENCOUNTER — Ambulatory Visit (INDEPENDENT_AMBULATORY_CARE_PROVIDER_SITE_OTHER): Payer: BC Managed Care – PPO | Admitting: Sports Medicine

## 2023-05-06 DIAGNOSIS — M7731 Calcaneal spur, right foot: Secondary | ICD-10-CM | POA: Diagnosis not present

## 2023-05-06 DIAGNOSIS — M7661 Achilles tendinitis, right leg: Secondary | ICD-10-CM

## 2023-05-06 NOTE — Progress Notes (Signed)
Patient says that she is feeling overall much better. She says in the morning she still has soreness and stiffness, but she can put more pressure through her foot. She says that she was a bit sore for a couple of hours after the shockwave therapy but has not had any new pain or soreness since then.

## 2023-05-06 NOTE — Progress Notes (Signed)
Brittany Quinn - 33 y.o. female MRN [id number]  Date of birth: 06/13/1990  Office Visit Note: Visit Date: 05/06/2023 PCP: Patient, No Pcp Per Referred by: No ref. provider found  Subjective: Chief Complaint  Patient presents with   Right Ankle - Follow-up   HPI: Brittany Quinn is a pleasant 33 y.o. female who presents today for follow-up of distal right Achilles tendinopathy enthesophytes.  At the last visit, she did complete a short course of prednisone 20 mg x 7 days, did proceed with our first trial of extracorporeal shockwave therapy.  With both of these treatments she feels much better, feels at least 75% improved.  He is able to now put weight and walk without significant pain.  Pertinent ROS were reviewed with the patient and found to be negative unless otherwise specified above in HPI.   Assessment & Plan: Visit Diagnoses:  1. Achilles tendinitis, right leg   2. Heel spur, right    Plan: Impression is improving distal insertional Achilles tendinitis with small enthesophytes.  She is at least 75% better with use ESWT and short course of oral prednisone.  We did repeat extracorporeal shockwave therapy today, patient tolerated well.  She will remain in her heel lift to help offload the Achilles.  I would like to bring her back in about 1 week for repeat ESWT.  After this session, we discussed getting her into either formal PT or home therapy to help stretch the Achilles and strengthen her posterior chain, we will plan to provide her this at next visit.  She may use over-the-counter anti-inflammatories only as needed.  Follow-up in 1 month.  Follow-up: Return for Return for f/u in 7-10 days for R-achilles.   Meds & Orders: No orders of the defined types were placed in this encounter.  No orders of the defined types were placed in this encounter.    Procedures: Procedure: ECSWT Indications:  Achilles Tendinopathy/Tendinitis   Procedure Details Consent: Risks of procedure as well as  the alternatives and risks of each were explained to the patient.  Verbal consent for procedure obtained. Time Out: Verified patient identification, verified procedure, site was marked, verified correct patient position. The area was cleaned with alcohol swab.     The Right achilles was targeted for Extracorporeal shockwave therapy.    Preset: Achillodynia Power Level: 60-90 mJ (50-60 at sup calc) Frequency: 8-10 Hz Impulse/cycles: 2250 Head size: Regular   Patient tolerated procedure well without immediate complications.      Clinical History: No specialty comments available.  She reports that she quit smoking about 4 years ago. Her smoking use included cigars. She has never used smokeless tobacco. No results for input(s): "HGBA1C", "LABURIC" in the last 8760 hours.  Objective:   Vital Signs: LMP 03/27/2023 (Exact Date)   Physical Exam  Gen: Well-appearing, in no acute distress; non-toxic CV: Well-perfused. Warm.  Resp: Breathing unlabored on room air; no wheezing. Psych: Fluid speech in conversation; appropriate affect; normal thought process  Ortho Exam - R foot/ankle: There is very mild TTP at the distal Achilles/superior calcaneus, although significantly improved from previous visit.  There is still some Achilles contracture with limitation with ankle dorsiflexion.  No redness swelling or effusion.  Imaging: No results found.  Past Medical/Family/Surgical/Social History: Medications & Allergies reviewed per EMR, new medications updated. Patient Active Problem List   Diagnosis Date Noted   Obesity 03/22/2020   Former smoker 12/17/2018   Past Medical History:  Diagnosis Date   Pre-diabetes  Retained products of conception after procedure done for miscarriage 06/05/2022   Trichomonas    Family History  Problem Relation Age of Onset   Breast cancer Mother        around 5   Cancer Father        prostate   Diabetes Father    Breast cancer Maternal Aunt     Breast cancer Cousin    Breast cancer Maternal Aunt    Past Surgical History:  Procedure Laterality Date   CESAREAN SECTION N/A 05/27/2019   Procedure: CESAREAN SECTION;  Surgeon: Kathrynn Running, MD;  Location: [facility];  Service: Obstetrics;  Laterality: N/A;   DILATION AND EVACUATION N/A 05/17/2022   Procedure: DILATATION AND EVACUATION;  Surgeon: Hermina Staggers, MD;  Location: [facility];  Service: Gynecology;  Laterality: N/A;   DILATION AND EVACUATION N/A 06/07/2022   Procedure: DILATATION AND EVACUATION;  Surgeon: Tereso Newcomer, MD;  Location: [facility];  Service: Gynecology;  Laterality: N/A;   LAPAROSCOPY  06/23/2011   Procedure: LAPAROSCOPY OPERATIVE;  Surgeon: Tereso Newcomer, MD;  Location: [facility];  Service: Gynecology;  Laterality: Left;  Diagnostic laparoscopy   LAPAROTOMY  06/23/2011   Procedure: LAPAROTOMY;  Surgeon: Tereso Newcomer, MD;  Location: [facility];  Service: Gynecology;  Laterality: Left;  Wedge resection of Ruptured left cornual ectopic pregnancy with left salpingectomy   OPERATIVE ULTRASOUND N/A 06/07/2022   Procedure: OPERATIVE ULTRASOUND;  Surgeon: Tereso Newcomer, MD;  Location: [facility];  Service: Gynecology;  Laterality: N/A;   Social History   Occupational History   Not on file  Tobacco Use   Smoking status: Former    Types: Cigars    Quit date: 09/01/2018    Years since quitting: 4.6   Smokeless tobacco: Never   Tobacco comments:    Blacks  Vaping Use   Vaping status: Never Used  Substance and Sexual Activity   Alcohol use: Not Currently    Comment: occ   Drug use: Not Currently    Types: Marijuana    Comment: last use May 2020   Sexual activity: Yes    Birth control/protection: None

## 2023-05-13 ENCOUNTER — Other Ambulatory Visit: Payer: Self-pay | Admitting: Certified Nurse Midwife

## 2023-05-13 DIAGNOSIS — A5901 Trichomonal vulvovaginitis: Secondary | ICD-10-CM

## 2023-05-13 DIAGNOSIS — N760 Acute vaginitis: Secondary | ICD-10-CM

## 2023-05-15 ENCOUNTER — Other Ambulatory Visit: Payer: Self-pay

## 2023-05-15 ENCOUNTER — Other Ambulatory Visit (HOSPITAL_COMMUNITY)
Admission: RE | Admit: 2023-05-15 | Discharge: 2023-05-15 | Disposition: A | Discharge status: Home / Self Care | Payer: No Typology Code available for payment source | Source: Ambulatory Visit | Attending: Obstetrics & Gynecology | Admitting: Obstetrics & Gynecology

## 2023-05-15 ENCOUNTER — Encounter: Payer: Self-pay | Admitting: Obstetrics & Gynecology

## 2023-05-15 ENCOUNTER — Ambulatory Visit (INDEPENDENT_AMBULATORY_CARE_PROVIDER_SITE_OTHER): Payer: BC Managed Care – PPO | Admitting: Obstetrics & Gynecology

## 2023-05-15 VITALS — BP 119/78 | HR 76 | Wt 229.0 lb

## 2023-05-15 DIAGNOSIS — Z1331 Encounter for screening for depression: Secondary | ICD-10-CM | POA: Diagnosis not present

## 2023-05-15 DIAGNOSIS — A5901 Trichomonal vulvovaginitis: Secondary | ICD-10-CM

## 2023-05-15 DIAGNOSIS — N760 Acute vaginitis: Secondary | ICD-10-CM | POA: Insufficient documentation

## 2023-05-15 DIAGNOSIS — R300 Dysuria: Secondary | ICD-10-CM

## 2023-05-15 LAB — POCT URINALYSIS DIP (DEVICE)
Bilirubin Urine: NEGATIVE
Glucose, UA: NEGATIVE mg/dL
Ketones, ur: NEGATIVE mg/dL
Leukocytes,Ua: NEGATIVE
Nitrite: NEGATIVE
Protein, ur: NEGATIVE mg/dL
Specific Gravity, Urine: >=1.03 (ref 1.005–1.030)
Urobilinogen, UA: 0.2 mg/dL (ref 0.0–1.0)
pH: 6 (ref 5.0–8.0)

## 2023-05-15 NOTE — Progress Notes (Signed)
GYNECOLOGY OFFICE VISIT NOTE  History:   Brittany Quinn is a 33 y.o. [phone] here today for evaluation of  vulvar irritation and dysuria for the past week.  Recently treated for trichomonal and bacterial vaginitis diagnosed on 04/18/23. She denies any abnormal vaginal discharge, bleeding, pelvic pain or other concerns.    Past Medical History:  Diagnosis Date   Pre-diabetes    Retained products of conception after procedure done for miscarriage 06/05/2022   Trichomonas     Past Surgical History:  Procedure Laterality Date   CESAREAN SECTION N/A 05/27/2019   Procedure: CESAREAN SECTION;  Surgeon: Kathrynn Running, MD;  Location: [facility];  Service: Obstetrics;  Laterality: N/A;   DILATION AND EVACUATION N/A 05/17/2022   Procedure: DILATATION AND EVACUATION;  Surgeon: Hermina Staggers, MD;  Location: [facility];  Service: Gynecology;  Laterality: N/A;   DILATION AND EVACUATION N/A 06/07/2022   Procedure: DILATATION AND EVACUATION;  Surgeon: Tereso Newcomer, MD;  Location: [facility];  Service: Gynecology;  Laterality: N/A;   LAPAROSCOPY  06/23/2011   Procedure: LAPAROSCOPY OPERATIVE;  Surgeon: Tereso Newcomer, MD;  Location: [facility];  Service: Gynecology;  Laterality: Left;  Diagnostic laparoscopy   LAPAROTOMY  06/23/2011   Procedure: LAPAROTOMY;  Surgeon: Tereso Newcomer, MD;  Location: [facility];  Service: Gynecology;  Laterality: Left;  Wedge resection of Ruptured left cornual ectopic pregnancy with left salpingectomy   OPERATIVE ULTRASOUND N/A 06/07/2022   Procedure: OPERATIVE ULTRASOUND;  Surgeon: Tereso Newcomer, MD;  Location: [facility];  Service: Gynecology;  Laterality: N/A;    The following portions of the patient's history were reviewed and updated as appropriate: allergies, current medications, past family history, past medical history, past social history, past surgical history and problem list.   Health Maintenance:  Normal pap and negative HRHPV on 04/18/23.    Review of Systems:  Pertinent  items noted in HPI and remainder of comprehensive ROS otherwise negative.  Physical Exam:  BP 119/78   Pulse 76   Wt 229 lb (103.9 kg)   LMP 04/22/2023 (Exact Date)   BMI 40.57 kg/m  CONSTITUTIONAL: Well-developed, well-nourished female in no acute distress.  SKIN: No rash noted. Not diaphoretic. No erythema. No pallor. MUSCULOSKELETAL: Normal range of motion. No edema noted. NEUROLOGIC: Alert and oriented to person, place, and time. Normal muscle tone coordination. No cranial nerve deficit noted. PSYCHIATRIC: Normal mood and affect. Normal behavior. Normal judgment and thought content. CARDIOVASCULAR: Normal heart rate noted RESPIRATORY: Effort and breath sounds normal, no problems with respiration noted ABDOMEN: No masses noted. No other overt distention noted.   PELVIC: Deferred  Labs and Imaging Results for orders placed or performed in visit on 05/15/23 (from the past week)  POCT urinalysis dip (device)   Collection Time: 05/15/23  8:37 AM  Result Value Ref Range   Glucose, UA NEGATIVE NEGATIVE mg/dL   Bilirubin Urine NEGATIVE NEGATIVE   Ketones, ur NEGATIVE NEGATIVE mg/dL   Specific Gravity, Urine >=1.030 1.005 - 1.030   Hgb urine dipstick TRACE (A) NEGATIVE   pH 6.0 5.0 - 8.0   Protein, ur NEGATIVE NEGATIVE mg/dL   Urobilinogen, UA 0.2 0.0 - 1.0 mg/dL   Nitrite NEGATIVE NEGATIVE   Leukocytes,Ua NEGATIVE NEGATIVE       Assessment and Plan:     1. Vulvovaginitis (Primary) Self swab done, will follow up results and manage accordingly.  Could be rebound yeast infection. - Cervicovaginal ancillary only Proper vulvar hygiene emphasized: discussed avoidance  of perfumed soaps, detergents, lotions and any type of douches; in addition to wearing cotton underwear and no underwear at night.  Also recommended cleaning front to back, voiding and cleaning up after intercourse.    2. Dysuria Urine dipstick negative, will follow up culture. - Urine Culture  Routine preventative  health maintenance measures emphasized. Please refer to After Visit Summary for other counseling recommendations.   Return for any gynecologic concerns.    I spent 25 minutes dedicated to the care of this patient including pre-visit review of records, face to face time with the patient discussing her conditions and treatments and post visit orders.    Jaynie Collins, MD, FACOG Obstetrician & Gynecologist, [facility] [employer], [facility]

## 2023-05-16 ENCOUNTER — Encounter: Payer: Self-pay | Admitting: Obstetrics & Gynecology

## 2023-05-16 ENCOUNTER — Ambulatory Visit: Payer: BC Managed Care – PPO | Admitting: Sports Medicine

## 2023-05-16 ENCOUNTER — Encounter: Payer: Self-pay | Admitting: Sports Medicine

## 2023-05-16 DIAGNOSIS — M7661 Achilles tendinitis, right leg: Secondary | ICD-10-CM | POA: Diagnosis not present

## 2023-05-16 DIAGNOSIS — M7731 Calcaneal spur, right foot: Secondary | ICD-10-CM

## 2023-05-16 LAB — CERVICOVAGINAL ANCILLARY ONLY
Bacterial Vaginitis (gardnerella): NEGATIVE
Candida Glabrata: NEGATIVE
Candida Vaginitis: NEGATIVE
Chlamydia: NEGATIVE
Comment: NEGATIVE
Comment: NEGATIVE
Comment: NEGATIVE
Comment: NEGATIVE
Comment: NEGATIVE
Comment: NORMAL
Neisseria Gonorrhea: NEGATIVE
Trichomonas: POSITIVE — AB

## 2023-05-16 MED ORDER — METRONIDAZOLE 500 MG PO TABS: 500.0000 mg | ORAL_TABLET | Freq: Two times a day (BID) | ORAL | 0 refills | Status: AC

## 2023-05-16 NOTE — Progress Notes (Signed)
Brittany Quinn - 33 y.o. female MRN [id number]  Date of birth: 02-02-1991  Office Visit Note: Visit Date: 05/16/2023 PCP: Patient, No Pcp Per Referred by: No ref. provider found  Subjective: Chief Complaint  Patient presents with   Right Ankle - Follow-up   HPI: Brittany Quinn is a pleasant 33 y.o. female who presents today for follow-up of distal right Achilles tendinopathy with enthesophytes.  We have performed 2 treatments of extracorporeal shockwave therapy today and Brittany Quinn is feeling largely improved.  She feels like she is at least 90% improved from prior to any treatments or medication.  She currently is not taking any medication.  She continues in her heel lifts.  She is agreeable to therapy, feels comfortable performing this on her own at home.  Pertinent ROS were reviewed with the patient and found to be negative unless otherwise specified above in HPI.   Assessment & Plan: Visit Diagnoses:  1. Achilles tendinitis, right leg   2. Heel spur, right    Plan: Impression is significantly improving distal insertional Achilles tendinitis with enthesophytes. She is at least 90% improved with ESWT and after a short course of oral prednisone weeks ago.  We did repeat 1 additional treatment of extracorporeal shockwave therapy.  We will take a break from this and let her transition to therapy for the Achilles and posterior chain, she is agreeable to starting this at home.  We did print out a customized rehab handout and my athletic trainer, Isabelle Course, did review these with her in the room today.  She will perform these at least 3 times weekly. I would like her to perform this over the next 6 weeks and see what sort of cumulative benefit she has from this and her prior shockwave treatments.  She will follow-up then only as needed.  She will keep in her heel lifts to help offload the Achilles, but once she is pain-free for at least 1 week she will transition out of these to regular tennis  shoes.  Follow-up: Return if symptoms worsen or fail to improve.   Meds & Orders: No orders of the defined types were placed in this encounter.  No orders of the defined types were placed in this encounter.    Procedures: Procedure: ECSWT Indications:  Achilles Tendinopathy/Tendinitis   Procedure Details Consent: Risks of procedure as well as the alternatives and risks of each were explained to the patient.  Verbal consent for procedure obtained. Time Out: Verified patient identification, verified procedure, site was marked, verified correct patient position. The area was cleaned with alcohol swab.     The Right achilles was targeted for Extracorporeal shockwave therapy.    Preset: Achillodynia Power Level: 60-80 mJ (60 at sup calc) Frequency: 8-10 Hz Impulse/cycles: 2200 Head size: Regular   Patient tolerated procedure well without immediate complications.       Clinical History: No specialty comments available.  She reports that she quit smoking about 4 years ago. Her smoking use included cigars. She has never used smokeless tobacco. No results for input(s): "HGBA1C", "LABURIC" in the last 8760 hours.  Objective:   Vital Signs: LMP 04/22/2023 (Exact Date)   Physical Exam  Gen: Well-appearing, in no acute distress; non-toxic CV: Well-perfused. Warm.  Resp: Breathing unlabored on room air; no wheezing. Psych: Fluid speech in conversation; appropriate affect; normal thought process  Ortho Exam -Right foot/ankle: No Achilles tendon thickness or TTP, very mild TTP with deep palpation over the superior calcaneus.  Mild Achilles contracture with  limitation in ankle dorsiflexion.  Otherwise unchanged exam from prior.  Imaging: No results found.  Past Medical/Family/Surgical/Social History: Medications & Allergies reviewed per EMR, new medications updated. Patient Active Problem List   Diagnosis Date Noted   Obesity 03/22/2020   Former smoker 12/17/2018   Past Medical  History:  Diagnosis Date   Pre-diabetes    Retained products of conception after procedure done for miscarriage 06/05/2022   Trichomonas    Family History  Problem Relation Age of Onset   Breast cancer Mother        around 34   Cancer Father        prostate   Diabetes Father    Breast cancer Maternal Aunt    Breast cancer Cousin    Breast cancer Maternal Aunt    Past Surgical History:  Procedure Laterality Date   CESAREAN SECTION N/A 05/27/2019   Procedure: CESAREAN SECTION;  Surgeon: Kathrynn Running, MD;  Location: [facility];  Service: Obstetrics;  Laterality: N/A;   DILATION AND EVACUATION N/A 05/17/2022   Procedure: DILATATION AND EVACUATION;  Surgeon: Hermina Staggers, MD;  Location: [facility];  Service: Gynecology;  Laterality: N/A;   DILATION AND EVACUATION N/A 06/07/2022   Procedure: DILATATION AND EVACUATION;  Surgeon: Tereso Newcomer, MD;  Location: [facility];  Service: Gynecology;  Laterality: N/A;   LAPAROSCOPY  06/23/2011   Procedure: LAPAROSCOPY OPERATIVE;  Surgeon: Tereso Newcomer, MD;  Location: [facility];  Service: Gynecology;  Laterality: Left;  Diagnostic laparoscopy   LAPAROTOMY  06/23/2011   Procedure: LAPAROTOMY;  Surgeon: Tereso Newcomer, MD;  Location: [facility];  Service: Gynecology;  Laterality: Left;  Wedge resection of Ruptured left cornual ectopic pregnancy with left salpingectomy   OPERATIVE ULTRASOUND N/A 06/07/2022   Procedure: OPERATIVE ULTRASOUND;  Surgeon: Tereso Newcomer, MD;  Location: [facility];  Service: Gynecology;  Laterality: N/A;   Social History   Occupational History   Not on file  Tobacco Use   Smoking status: Former    Types: Cigars    Quit date: 09/01/2018    Years since quitting: 4.7   Smokeless tobacco: Never   Tobacco comments:    Blacks  Vaping Use   Vaping status: Never Used  Substance and Sexual Activity   Alcohol use: Not Currently    Comment: occ   Drug use: Not Currently    Types: Marijuana    Comment: last use May 2020   Sexual  activity: Yes    Birth control/protection: None

## 2023-05-16 NOTE — Progress Notes (Signed)
Patient says that she is feeling much better today. She was sore after the last treatment but was better the next day. She says that she no longer has pain, and has some stiffness in the morning but that improves once she is up and moving around.

## 2023-05-16 NOTE — Addendum Note (Signed)
Addended by: Jaynie Collins A on: 05/16/2023 03:34 PM   Modules accepted: Orders

## 2023-05-16 NOTE — Progress Notes (Signed)
Patient was instructed in 10 minutes of therapeutic exercises for R achilles to improve strength, ROM and function according to my instructions and plan of care by a Certified Athletic Trainer during the office visit. A customized handout was provided and demonstration of proper technique shown and discussed. Patient did perform exercises and demonstrate understanding through teachback.  All questions discussed and answered.

## 2023-05-17 ENCOUNTER — Encounter: Payer: Self-pay | Admitting: Obstetrics & Gynecology

## 2023-05-17 LAB — URINE CULTURE

## 2023-05-29 DIAGNOSIS — Z6841 Body Mass Index (BMI) 40.0 and over, adult: Secondary | ICD-10-CM | POA: Diagnosis not present

## 2023-05-29 DIAGNOSIS — Z713 Dietary counseling and surveillance: Secondary | ICD-10-CM | POA: Diagnosis not present

## 2023-07-02 ENCOUNTER — Encounter: Payer: Self-pay | Admitting: Certified Nurse Midwife

## 2024-02-17 ENCOUNTER — Encounter: Payer: Self-pay | Admitting: Radiology
